# Patient Record
Sex: Male | Born: 1965 | Race: White | Hispanic: No | Smoking: Current every day smoker
Health system: Southern US, Community
[De-identification: ages and names within clinical notes are randomized; demographics above are authoritative.]

## PROBLEM LIST (undated history)

## (undated) DIAGNOSIS — K08109 Complete loss of teeth, unspecified cause, unspecified class: Secondary | ICD-10-CM

## (undated) DIAGNOSIS — D649 Anemia, unspecified: Secondary | ICD-10-CM

## (undated) DIAGNOSIS — R0600 Dyspnea, unspecified: Secondary | ICD-10-CM

## (undated) DIAGNOSIS — Z8614 Personal history of Methicillin resistant Staphylococcus aureus infection: Secondary | ICD-10-CM

## (undated) DIAGNOSIS — I10 Essential (primary) hypertension: Secondary | ICD-10-CM

## (undated) DIAGNOSIS — K279 Peptic ulcer, site unspecified, unspecified as acute or chronic, without hemorrhage or perforation: Secondary | ICD-10-CM

## (undated) DIAGNOSIS — I359 Nonrheumatic aortic valve disorder, unspecified: Secondary | ICD-10-CM

## (undated) DIAGNOSIS — G8929 Other chronic pain: Secondary | ICD-10-CM

## (undated) DIAGNOSIS — K219 Gastro-esophageal reflux disease without esophagitis: Secondary | ICD-10-CM

## (undated) DIAGNOSIS — Z87442 Personal history of urinary calculi: Secondary | ICD-10-CM

## (undated) DIAGNOSIS — J449 Chronic obstructive pulmonary disease, unspecified: Secondary | ICD-10-CM

## (undated) DIAGNOSIS — G4730 Sleep apnea, unspecified: Secondary | ICD-10-CM

## (undated) DIAGNOSIS — T7840XA Allergy, unspecified, initial encounter: Secondary | ICD-10-CM

## (undated) DIAGNOSIS — I219 Acute myocardial infarction, unspecified: Secondary | ICD-10-CM

## (undated) DIAGNOSIS — B3781 Candidal esophagitis: Secondary | ICD-10-CM

## (undated) DIAGNOSIS — E559 Vitamin D deficiency, unspecified: Secondary | ICD-10-CM

## (undated) DIAGNOSIS — I209 Angina pectoris, unspecified: Secondary | ICD-10-CM

## (undated) DIAGNOSIS — Z87820 Personal history of traumatic brain injury: Secondary | ICD-10-CM

## (undated) DIAGNOSIS — I351 Nonrheumatic aortic (valve) insufficiency: Secondary | ICD-10-CM

## (undated) DIAGNOSIS — Z8719 Personal history of other diseases of the digestive system: Secondary | ICD-10-CM

## (undated) DIAGNOSIS — M1990 Unspecified osteoarthritis, unspecified site: Secondary | ICD-10-CM

## (undated) DIAGNOSIS — Z972 Presence of dental prosthetic device (complete) (partial): Secondary | ICD-10-CM

## (undated) DIAGNOSIS — R05 Cough: Secondary | ICD-10-CM

## (undated) DIAGNOSIS — F329 Major depressive disorder, single episode, unspecified: Secondary | ICD-10-CM

## (undated) DIAGNOSIS — S72009A Fracture of unspecified part of neck of unspecified femur, initial encounter for closed fracture: Secondary | ICD-10-CM

## (undated) DIAGNOSIS — G608 Other hereditary and idiopathic neuropathies: Secondary | ICD-10-CM

## (undated) DIAGNOSIS — I5189 Other ill-defined heart diseases: Secondary | ICD-10-CM

## (undated) DIAGNOSIS — N200 Calculus of kidney: Secondary | ICD-10-CM

## (undated) DIAGNOSIS — J302 Other seasonal allergic rhinitis: Secondary | ICD-10-CM

## (undated) DIAGNOSIS — Q231 Congenital insufficiency of aortic valve: Secondary | ICD-10-CM

## (undated) DIAGNOSIS — J45909 Unspecified asthma, uncomplicated: Secondary | ICD-10-CM

## (undated) DIAGNOSIS — F32A Depression, unspecified: Secondary | ICD-10-CM

## (undated) DIAGNOSIS — I2510 Atherosclerotic heart disease of native coronary artery without angina pectoris: Secondary | ICD-10-CM

## (undated) DIAGNOSIS — T8484XA Pain due to internal orthopedic prosthetic devices, implants and grafts, initial encounter: Secondary | ICD-10-CM

## (undated) DIAGNOSIS — E876 Hypokalemia: Secondary | ICD-10-CM

## (undated) DIAGNOSIS — Z974 Presence of external hearing-aid: Secondary | ICD-10-CM

## (undated) DIAGNOSIS — Z87898 Personal history of other specified conditions: Secondary | ICD-10-CM

## (undated) DIAGNOSIS — Q2381 Bicuspid aortic valve: Secondary | ICD-10-CM

## (undated) DIAGNOSIS — R059 Cough, unspecified: Secondary | ICD-10-CM

## (undated) DIAGNOSIS — E785 Hyperlipidemia, unspecified: Secondary | ICD-10-CM

## (undated) HISTORY — DX: Gastro-esophageal reflux disease without esophagitis: K21.9

## (undated) HISTORY — DX: Other hereditary and idiopathic neuropathies: G60.8

## (undated) HISTORY — DX: Anemia, unspecified: D64.9

## (undated) HISTORY — DX: Allergy, unspecified, initial encounter: T78.40XA

## (undated) HISTORY — PX: FRACTURE SURGERY: SHX138

## (undated) HISTORY — DX: Essential (primary) hypertension: I10

## (undated) HISTORY — DX: Personal history of Methicillin resistant Staphylococcus aureus infection: Z86.14

## (undated) HISTORY — DX: Nonrheumatic aortic (valve) insufficiency: I35.1

## (undated) HISTORY — PX: KNEE SURGERY: SHX244

## (undated) HISTORY — PX: TONSILLECTOMY: SUR1361

## (undated) HISTORY — DX: Unspecified osteoarthritis, unspecified site: M19.90

## (undated) HISTORY — DX: Chronic obstructive pulmonary disease, unspecified: J44.9

## (undated) HISTORY — PX: OTHER SURGICAL HISTORY: SHX169

---

## 1988-06-19 HISTORY — PX: KNEE SURGERY: SHX244

## 1997-10-09 ENCOUNTER — Emergency Department (HOSPITAL_COMMUNITY): Admission: EM | Admit: 1997-10-09 | Discharge: 1997-10-09 | Payer: Self-pay | Admitting: Emergency Medicine

## 1998-06-19 HISTORY — PX: COLONOSCOPY: SHX174

## 2000-06-19 HISTORY — PX: HERNIA REPAIR: SHX51

## 2001-07-23 ENCOUNTER — Emergency Department (HOSPITAL_COMMUNITY): Admission: EM | Admit: 2001-07-23 | Discharge: 2001-07-23 | Payer: Self-pay | Admitting: Emergency Medicine

## 2001-07-24 ENCOUNTER — Encounter: Payer: Self-pay | Admitting: Emergency Medicine

## 2001-07-25 ENCOUNTER — Encounter: Payer: Self-pay | Admitting: Surgery

## 2001-07-25 ENCOUNTER — Emergency Department (HOSPITAL_COMMUNITY): Admission: EM | Admit: 2001-07-25 | Discharge: 2001-07-25 | Payer: Self-pay | Admitting: Emergency Medicine

## 2002-06-19 HISTORY — PX: JOINT REPLACEMENT: SHX530

## 2007-01-09 ENCOUNTER — Emergency Department: Payer: Self-pay | Admitting: Unknown Physician Specialty

## 2007-04-24 ENCOUNTER — Emergency Department: Payer: Self-pay | Admitting: Emergency Medicine

## 2007-08-25 ENCOUNTER — Emergency Department: Payer: Self-pay | Admitting: Emergency Medicine

## 2007-09-25 ENCOUNTER — Ambulatory Visit: Payer: Self-pay | Admitting: Internal Medicine

## 2008-04-25 ENCOUNTER — Emergency Department: Payer: Self-pay | Admitting: Emergency Medicine

## 2008-07-11 ENCOUNTER — Emergency Department: Payer: Self-pay | Admitting: Emergency Medicine

## 2009-08-24 ENCOUNTER — Inpatient Hospital Stay: Payer: Self-pay | Admitting: Unknown Physician Specialty

## 2011-01-26 ENCOUNTER — Ambulatory Visit: Payer: Self-pay | Admitting: Orthopedic Surgery

## 2011-01-31 ENCOUNTER — Ambulatory Visit: Payer: Self-pay | Admitting: Orthopedic Surgery

## 2011-01-31 HISTORY — PX: OTHER SURGICAL HISTORY: SHX169

## 2011-08-21 DIAGNOSIS — M1610 Unilateral primary osteoarthritis, unspecified hip: Secondary | ICD-10-CM | POA: Insufficient documentation

## 2011-08-21 DIAGNOSIS — S7292XA Unspecified fracture of left femur, initial encounter for closed fracture: Secondary | ICD-10-CM | POA: Insufficient documentation

## 2011-10-09 DIAGNOSIS — Z969 Presence of functional implant, unspecified: Secondary | ICD-10-CM | POA: Insufficient documentation

## 2011-10-09 DIAGNOSIS — M25559 Pain in unspecified hip: Secondary | ICD-10-CM | POA: Insufficient documentation

## 2012-06-28 DIAGNOSIS — M1610 Unilateral primary osteoarthritis, unspecified hip: Secondary | ICD-10-CM | POA: Insufficient documentation

## 2012-08-26 DIAGNOSIS — M7070 Other bursitis of hip, unspecified hip: Secondary | ICD-10-CM | POA: Insufficient documentation

## 2012-12-19 DIAGNOSIS — M2550 Pain in unspecified joint: Secondary | ICD-10-CM | POA: Insufficient documentation

## 2012-12-19 DIAGNOSIS — M25559 Pain in unspecified hip: Secondary | ICD-10-CM | POA: Insufficient documentation

## 2013-01-07 ENCOUNTER — Ambulatory Visit: Payer: Self-pay | Admitting: Family Medicine

## 2013-01-07 LAB — RAPID STREP-A WITH REFLX: Micro Text Report: NEGATIVE

## 2013-01-10 LAB — BETA STREP CULTURE(ARMC)

## 2013-02-12 DIAGNOSIS — K219 Gastro-esophageal reflux disease without esophagitis: Secondary | ICD-10-CM | POA: Insufficient documentation

## 2013-02-12 DIAGNOSIS — H9190 Unspecified hearing loss, unspecified ear: Secondary | ICD-10-CM | POA: Insufficient documentation

## 2013-12-25 ENCOUNTER — Emergency Department: Payer: Self-pay | Admitting: Emergency Medicine

## 2013-12-25 LAB — CBC
HCT: 42 % (ref 40.0–52.0)
HGB: 13.8 g/dL (ref 13.0–18.0)
MCH: 27.7 pg (ref 26.0–34.0)
MCHC: 32.8 g/dL (ref 32.0–36.0)
MCV: 85 fL (ref 80–100)
Platelet: 340 10*3/uL (ref 150–440)
RBC: 4.97 10*6/uL (ref 4.40–5.90)
RDW: 16 % — ABNORMAL HIGH (ref 11.5–14.5)
WBC: 9.7 10*3/uL (ref 3.8–10.6)

## 2013-12-25 LAB — BASIC METABOLIC PANEL
Anion Gap: 12 (ref 7–16)
BUN: 5 mg/dL — ABNORMAL LOW (ref 7–18)
Calcium, Total: 8.8 mg/dL (ref 8.5–10.1)
Chloride: 105 mmol/L (ref 98–107)
Co2: 21 mmol/L (ref 21–32)
Creatinine: 0.9 mg/dL (ref 0.60–1.30)
EGFR (African American): >60
EGFR (Non-African Amer.): >60
Glucose: 129 mg/dL — ABNORMAL HIGH (ref 65–99)
Osmolality: 275 (ref 275–301)
Potassium: 2.9 mmol/L — ABNORMAL LOW (ref 3.5–5.1)
Sodium: 138 mmol/L (ref 136–145)

## 2013-12-25 LAB — TROPONIN I: Troponin-I: <0.02 ng/mL

## 2014-01-05 DIAGNOSIS — R0602 Shortness of breath: Secondary | ICD-10-CM | POA: Insufficient documentation

## 2014-01-05 DIAGNOSIS — Z77090 Contact with and (suspected) exposure to asbestos: Secondary | ICD-10-CM | POA: Insufficient documentation

## 2014-04-16 DIAGNOSIS — M25562 Pain in left knee: Secondary | ICD-10-CM | POA: Insufficient documentation

## 2014-04-21 ENCOUNTER — Ambulatory Visit: Payer: Self-pay | Admitting: Unknown Physician Specialty

## 2014-05-28 DIAGNOSIS — G2581 Restless legs syndrome: Secondary | ICD-10-CM

## 2014-05-28 DIAGNOSIS — Z720 Tobacco use: Secondary | ICD-10-CM | POA: Insufficient documentation

## 2014-05-28 DIAGNOSIS — I10 Essential (primary) hypertension: Secondary | ICD-10-CM | POA: Insufficient documentation

## 2014-05-28 HISTORY — DX: Restless legs syndrome: G25.81

## 2014-07-21 DIAGNOSIS — M79604 Pain in right leg: Secondary | ICD-10-CM | POA: Insufficient documentation

## 2014-07-21 DIAGNOSIS — M79605 Pain in left leg: Secondary | ICD-10-CM

## 2014-07-24 ENCOUNTER — Ambulatory Visit: Payer: Self-pay | Admitting: Neurology

## 2014-08-11 DIAGNOSIS — M542 Cervicalgia: Secondary | ICD-10-CM | POA: Insufficient documentation

## 2014-08-18 ENCOUNTER — Encounter: Payer: Self-pay | Admitting: *Deleted

## 2014-08-26 ENCOUNTER — Encounter: Payer: Self-pay | Admitting: *Deleted

## 2014-08-26 ENCOUNTER — Ambulatory Visit (INDEPENDENT_AMBULATORY_CARE_PROVIDER_SITE_OTHER): Payer: Medicare Other | Admitting: General Surgery

## 2014-08-26 ENCOUNTER — Encounter: Payer: Self-pay | Admitting: General Surgery

## 2014-08-26 VITALS — BP 180/100 | HR 82 | Resp 14 | Ht 67.0 in | Wt 145.0 lb

## 2014-08-26 DIAGNOSIS — R1030 Lower abdominal pain, unspecified: Secondary | ICD-10-CM

## 2014-08-26 DIAGNOSIS — R1031 Right lower quadrant pain: Secondary | ICD-10-CM

## 2014-08-26 NOTE — Patient Instructions (Signed)
Patient to have a cat scan .

## 2014-08-26 NOTE — Progress Notes (Signed)
Patient ID: Kevin Alexander, male   DOB: [birth year], 49 y.o.   MRN: [id number]  Patient has been scheduled for a CT abdomen/pelvis with contrast at [facility] for 08-28-14 at 4 pm (arrive 3:45 pm). Prep: no solids 4 hours prior but patient may have clear liquids up until exam time, pick up prep kit, and take medication list. Patient verbalizes understanding.  This patient will return for follow up appointment after CT scan has been completed.

## 2014-08-26 NOTE — Progress Notes (Signed)
Patient ID: Kevin Alexander, male   DOB: Jul 13, 1965, 49 y.o.   MRN: [id number]  Chief Complaint  Patient presents with  . Other    evaluation for inguinal hernia    HPI Kevin Alexander is a 49 y.o. male here today for a evaluation of a right inguinal hernia. Patient noticed this about one month ago. He states the area is swollen and the area usually stay out with a burning sensation.Patient had bilateral inguinal repair in 2002 via laparoscopy with mesh. Patient states he has had blood in his stool  and started taking iron pill and fiber  and has noticed  Blood on paper. The stools  are dark black-possibly from iron intake.  HPI  Past Medical History  Diagnosis Date  . GERD (gastroesophageal reflux disease)   . Hx MRSA infection   . Anemia   . Arthritis   . Hypertension     Past Surgical History  Procedure Laterality Date  . Knee surgery Right 1990  . Joint replacement  2004  . Hernia repair Bilateral 2002  . Colonoscopy  2000    History reviewed. No pertinent family history.  Social History History  Substance Use Topics  . Smoking status: Current Every Day Smoker -- 1.00 packs/day for 30 years  . Smokeless tobacco: Never Used  . Alcohol Use: Yes    No Known Allergies  Current Outpatient Prescriptions  Medication Sig Dispense Refill  . cetirizine (ZYRTEC) 10 MG tablet Take 10 mg by mouth daily.  11  . diclofenac (VOLTAREN) 50 MG EC tablet Take 50 mg by mouth.   1  . fexofenadine (ALLEGRA) 180 MG tablet Take 180 mg by mouth daily.   3  . gabapentin (NEURONTIN) 300 MG capsule Take 300 mg by mouth 2 (two) times daily.   3  . Inulin (FIBER CHOICE PO) Take by mouth daily.    . IRON CR PO Take by mouth daily.    Marland Kitchen lisinopril-hydrochlorothiazide (PRINZIDE,ZESTORETIC) 20-12.5 MG per tablet Take 1 tablet by mouth daily.   0  . omeprazole (PRILOSEC) 40 MG capsule Take 40 mg by mouth daily.  3  . sertraline (ZOLOFT) 50 MG tablet Take 75 mg by mouth daily.  0  .  traMADol-acetaminophen (ULTRACET) 37.5-325 MG per tablet 1 tablet every 6 (six) hours as needed.   0   No current facility-administered medications for this visit.    Review of Systems Review of Systems  Constitutional: Negative.   Respiratory: Negative.   Cardiovascular: Negative.   Gastrointestinal: Positive for constipation and blood in stool. Negative for nausea, vomiting, abdominal pain, diarrhea, abdominal distention, anal bleeding and rectal pain.    Blood pressure 180/100, pulse 82, resp. rate 14, height 5\' 7"  (1.702 m), weight 145 lb (65.772 kg).  Physical Exam Physical Exam  Constitutional: He is oriented to person, place, and time. He appears well-developed and well-nourished.  Eyes: Conjunctivae are normal. No scleral icterus.  Neck: Neck supple.  Cardiovascular: Normal rate, regular rhythm and normal heart sounds.   Pulmonary/Chest: Effort normal and breath sounds normal.  Abdominal: Soft. Normal appearance and bowel sounds are normal. There is no hepatomegaly. There is no tenderness. No hernia ( tenderness in the right groin).  Lymphadenopathy:    He has no cervical adenopathy.  Neurological: He is alert and oriented to person, place, and time.  Skin: Skin is warm and dry.   Careful exam of the inguinal area with pt supine and standing and straining showed no hernia  Data Reviewed Note reviewed   Assessment    Right groin pain. No hernia noted but by pt history he had noted a swelling there before.     Plan   Discussed findings with pt. CT may help. He is agreeable. Patient to have a cat scan of the abdomen/ pelvis.   Vung Kush G 08/26/2014, 11:11 AM

## 2014-09-03 ENCOUNTER — Ambulatory Visit: Payer: Medicare Other | Admitting: General Surgery

## 2014-09-07 ENCOUNTER — Ambulatory Visit: Payer: Self-pay | Admitting: Surgery

## 2014-09-10 ENCOUNTER — Ambulatory Visit: Payer: Self-pay | Admitting: Surgery

## 2014-10-05 ENCOUNTER — Ambulatory Visit
Admit: 2014-10-05 | Disposition: A | Discharge status: Home / Self Care | Payer: Self-pay | Attending: Gastroenterology | Admitting: Gastroenterology

## 2015-01-18 DIAGNOSIS — I219 Acute myocardial infarction, unspecified: Secondary | ICD-10-CM

## 2015-01-18 HISTORY — DX: Acute myocardial infarction, unspecified: I21.9

## 2015-04-06 ENCOUNTER — Encounter: Payer: Self-pay | Admitting: *Deleted

## 2015-04-07 ENCOUNTER — Encounter: Payer: Self-pay | Admitting: Anesthesiology

## 2015-04-07 NOTE — Anesthesia Preprocedure Evaluation (Addendum)
Anesthesia Evaluation  Patient identified by MRN, date of birth, ID band  Reviewed: Allergy & Precautions, H&P , NPO status , Patient's Chart, lab work & pertinent test results  Airway Mallampati: II  TM Distance: >3 FB Neck ROM: full    Dental no notable dental hx. (+) Lower Dentures, Upper Dentures   Pulmonary COPD, Current Smoker,    Pulmonary exam normal        Cardiovascular hypertension,  Rhythm:regular Rate:Normal     Neuro/Psych Seizures -,     GI/Hepatic GERD  ,  Endo/Other    Renal/GU      Musculoskeletal   Abdominal   Peds  Hematology   Anesthesia Other Findings   Reproductive/Obstetrics                           Anesthesia Physical Anesthesia Plan  ASA: II  Anesthesia Plan: General ETT   Post-op Pain Management:    Induction:   Airway Management Planned:   Additional Equipment:   Intra-op Plan:   Post-operative Plan:   Informed Consent: I have reviewed the patients History and Physical, chart, labs and discussed the procedure including the risks, benefits and alternatives for the proposed anesthesia with the patient or authorized representative who has indicated his/her understanding and acceptance.     Plan Discussed with: CRNA  Anesthesia Plan Comments:         Anesthesia Quick Evaluation

## 2015-04-08 ENCOUNTER — Encounter: Payer: Self-pay | Admitting: *Deleted

## 2015-04-12 NOTE — Discharge Instructions (Signed)
[facility] °[facility] °ENDOSCOPIC SINUS SURGERY °Why [facility] ° °What is Functional Endoscopic Sinus Surgery? ° The Surgery involves making the natural openings of the sinuses larger by removing the bony partitions that separate the sinuses from the nasal cavity.  The natural sinus lining is preserved as much as possible to allow the sinuses to resume normal function after the surgery.  In some patients nasal polyps (excessively swollen lining of the sinuses) may be removed to relieve obstruction of the sinus openings.  The surgery is performed through the nose using lighted scopes, which eliminates the need for incisions on the face.  A septoplasty is a different procedure which is sometimes performed with sinus surgery.  It involves straightening the boy partition that separates the two sides of your nose.  A crooked or deviated septum may need repair if is obstructing the sinuses or nasal airflow.  Turbinate reduction is also often performed during sinus surgery.  The turbinates are bony proturberances from the side walls of the nose which swell and can obstruct the nose in patients with sinus and allergy problems.  Their size can be surgically reduced to help relieve nasal obstruction. ° °What Can Sinus Surgery Do For Me? ° Sinus surgery can reduce the frequency of sinus infections requiring antibiotic treatment.  This can provide improvement in nasal congestion, post-nasal drainage, facial pressure and nasal obstruction.  Surgery will NOT prevent you from ever having an infection again, so it usually only for patients who get infections 4 or more times yearly requiring antibiotics, or for infections that do not clear with antibiotics.  It will not cure nasal allergies, so patients with allergies may still require medication to treat their allergies after surgery. Surgery may improve headaches related to sinusitis, however, some people will continue to  require medication to control sinus headaches related to allergies.  Surgery will do nothing for other forms of headache (migraine, tension or cluster). ° °What Are the Risks of Endoscopic Sinus Surgery? ° Current techniques allow surgery to be performed safely with little risk, however, there are rare complications that patients should be aware of.  Because the sinuses are located around the eyes, there is risk of eye injury, including blindness, though again, this would be quite rare. This is usually a result of bleeding behind the eye during surgery, which puts the vision oat risk, though there are treatments to protect the vision and prevent permanent disrupted by surgery causing a leak of the spinal fluid that surrounds the brain.  More serious complications would include bleeding inside the brain cavity or damage to the brain.  Again, all of these complications are uncommon, and spinal fluid leaks can be safely managed surgically if they occur.  The most common complication of sinus surgery is bleeding from the nose, which may require packing or cauterization of the nose.  Continued sinus have polyps may experience recurrence of the polyps requiring revision surgery.  Alterations of sense of smell or injury to the tear ducts are also rare complications.  ° °What is the Surgery Like, and what is the Recovery? ° The Surgery usually takes a couple of hours to perform, and is usually performed under a general anesthetic (completely asleep).  Patients are usually discharged home after a couple of hours.  Sometimes during surgery it is necessary to pack the nose to control bleeding, and the packing is left in place for 24 - 48 hours, and removed by your surgeon.    If a septoplasty was performed during the procedure, there is often a splint placed which must be removed after 5-7 days.   °Discomfort: Pain is usually mild to moderate, and can be controlled by prescription pain medication or acetaminophen (Tylenol).   Aspirin, Ibuprofen (Advil, Motrin), or Naprosyn (Aleve) should be avoided, as they can cause increased bleeding.  Most patients feel sinus pressure like they have a bad head cold for several days.  Sleeping with your head elevated can help reduce swelling and facial pressure, as can ice packs over the face.  A humidifier may be helpful to keep the mucous and blood from drying in the nose.  ° °Diet: There are no specific diet restrictions, however, you should generally start with clear liquids and a light diet of bland foods because the anesthetic can cause some nausea.  Advance your diet depending on how your stomach feels.  Taking your pain medication with food will often help reduce stomach upset which pain medications can cause. ° °Nasal Saline Irrigation: It is important to remove blood clots and dried mucous from the nose as it is healing.  This is done by having you irrigate the nose at least 3 - 4 times daily with a salt water solution.  We recommend using NeilMed Sinus Rinse (available at the drug store).  Fill the squeeze bottle with the solution, bend over a sink, and insert the tip of the squeeze bottle into the nose ½ of an inch.  Point the tip of the squeeze bottle towards the inside corner of the eye on the same side your irrigating.  Squeeze the bottle and gently irrigate the nose.  If you bend forward as you do this, most of the fluid will flow back out of the nose, instead of down your throat.   The solution should be warm, near body temperature, when you irrigate.   Each time you irrigate, you should use a full squeeze bottle.  ° °Note that if you are instructed to use Nasal Steroid Sprays at any time after your surgery, irrigate with saline BEFORE using the steroid spray, so you do not wash it all out of the nose. °Another product, Nasal Saline Gel (such as AYR Nasal Saline Gel) can be applied in each nostril 3 - 4 times daily to moisture the nose and reduce scabbing or crusting. ° °Bleeding:   Bloody drainage from the nose can be expected for several days, and patients are instructed to irrigate their nose frequently with salt water to help remove mucous and blood clots.  The drainage may be dark red or brown, though some fresh blood may be seen intermittently, especially after irrigation.  Do not blow you nose, as bleeding may occur. If you must sneeze, keep your mouth open to allow air to escape through your mouth. ° °If heavy bleeding occurs: Irrigate the nose with saline to rinse out clots, then spray the nose 3 - 4 times with Afrin Nasal Decongestant Spray.  The spray will constrict the blood vessels to slow bleeding.  Pinch the lower half of your nose shut to apply pressure, and lay down with your head elevated.  Ice packs over the nose may help as well. If bleeding persists despite these measures, you should notify your doctor.  Do not use the Afrin routinely to control nasal congestion after surgery, as it can result in worsening congestion and may affect healing.  ° ° ° °Activity: Return to work varies among patients. Most patients will be   out of work at least 5 - 7 days to recover.  Patient may return to work after they are off of narcotic pain medication, and feeling well enough to perform the functions of their job.  Patients must avoid heavy lifting (over 10 pounds) or strenuous physical for 2 weeks after surgery, so your employer may need to assign you to light duty, or keep you out of work longer if light duty is not possible.  NOTE: you should not drive, operate dangerous machinery, do any mentally demanding tasks or make any important legal or financial decisions while on narcotic pain medication and recovering from the general anesthetic.  °  °Call Your Doctor Immediately if You Have Any of the Following: °1. Bleeding that you cannot control with the above measures °2. Loss of vision, double vision, bulging of the eye or black eyes. °3. Fever over 101 degrees °4. Neck stiffness with  severe headache, fever, nausea and change in mental state. °You are always encourage to call anytime with concerns, however, please call with requests for pain medication refills during office hours. ° °Office Endoscopy: During follow-up visits your doctor will remove any packing or splints that may have been placed and evaluate and clean your sinuses endoscopically.  Topical anesthetic will be used to make this as comfortable as possible, though you may want to take your pain medication prior to the visit.  How often this will need to be done varies from patient to patient.  After complete recovery from the surgery, you may need follow-up endoscopy from time to time, particularly if there is concern of recurrent infection or nasal polyps. ° °General Anesthesia, Adult, Care After °Refer to this sheet in the next few weeks. These instructions provide you with information on caring for yourself after your procedure. Your health care provider may also give you more specific instructions. Your treatment has been planned according to current medical practices, but problems sometimes occur. Call your health care provider if you have any problems or questions after your procedure. °WHAT TO EXPECT AFTER THE PROCEDURE °After the procedure, it is typical to experience: °· Sleepiness. °· Nausea and vomiting. °HOME CARE INSTRUCTIONS °· For the first 24 hours after general anesthesia: °¨ Have a responsible person with you. °¨ Do not drive a car. If you are alone, do not take public transportation. °¨ Do not drink alcohol. °¨ Do not take medicine that has not been prescribed by your health care provider. °¨ Do not sign important papers or make important decisions. °¨ You may resume a normal diet and activities as directed by your health care provider. °· Change bandages (dressings) as directed. °· If you have questions or problems that seem related to general anesthesia, call the hospital and ask for the anesthetist or  anesthesiologist on call. °SEEK MEDICAL CARE IF: °· You have nausea and vomiting that continue the day after anesthesia. °· You develop a rash. °SEEK IMMEDIATE MEDICAL CARE IF:  °· You have difficulty breathing. °· You have chest pain. °· You have any allergic problems. °  °This information is not intended to replace advice given to you by your health care provider. Make sure you discuss any questions you have with your health care provider. °  °Document Released: 09/11/2000 Document Revised: 06/26/2014 Document Reviewed: 10/04/2011 °Elsevier Interactive Patient Education ©2016 [employer] ° °

## 2015-04-13 ENCOUNTER — Ambulatory Visit: Payer: Medicare Other | Admitting: Anesthesiology

## 2015-04-13 ENCOUNTER — Encounter
Admission: RE | Disposition: A | Discharge status: Home / Self Care | Payer: Self-pay | Source: Ambulatory Visit | Attending: Otolaryngology

## 2015-04-13 ENCOUNTER — Ambulatory Visit
Admission: RE | Admit: 2015-04-13 | Discharge: 2015-04-13 | Disposition: A | Discharge status: Home / Self Care | Payer: Medicare Other | Source: Ambulatory Visit | Attending: Otolaryngology | Admitting: Otolaryngology

## 2015-04-13 DIAGNOSIS — J343 Hypertrophy of nasal turbinates: Secondary | ICD-10-CM | POA: Diagnosis not present

## 2015-04-13 DIAGNOSIS — Z7982 Long term (current) use of aspirin: Secondary | ICD-10-CM | POA: Insufficient documentation

## 2015-04-13 DIAGNOSIS — K219 Gastro-esophageal reflux disease without esophagitis: Secondary | ICD-10-CM | POA: Diagnosis not present

## 2015-04-13 DIAGNOSIS — I10 Essential (primary) hypertension: Secondary | ICD-10-CM | POA: Diagnosis not present

## 2015-04-13 DIAGNOSIS — J449 Chronic obstructive pulmonary disease, unspecified: Secondary | ICD-10-CM | POA: Insufficient documentation

## 2015-04-13 DIAGNOSIS — F17210 Nicotine dependence, cigarettes, uncomplicated: Secondary | ICD-10-CM | POA: Diagnosis not present

## 2015-04-13 DIAGNOSIS — R569 Unspecified convulsions: Secondary | ICD-10-CM | POA: Insufficient documentation

## 2015-04-13 DIAGNOSIS — J342 Deviated nasal septum: Secondary | ICD-10-CM | POA: Insufficient documentation

## 2015-04-13 HISTORY — PX: TURBINATE RESECTION: SHX6158

## 2015-04-13 HISTORY — DX: Cough: R05

## 2015-04-13 HISTORY — PX: SEPTOPLASTY: SHX2393

## 2015-04-13 HISTORY — DX: Acute myocardial infarction, unspecified: I21.9

## 2015-04-13 HISTORY — DX: Nonrheumatic aortic valve disorder, unspecified: I35.9

## 2015-04-13 HISTORY — DX: Cough, unspecified: R05.9

## 2015-04-13 HISTORY — DX: Presence of dental prosthetic device (complete) (partial): Z97.2

## 2015-04-13 HISTORY — DX: Presence of external hearing-aid: Z97.4

## 2015-04-13 SURGERY — SEPTOPLASTY, NOSE
Anesthesia: General | Wound class: Clean Contaminated

## 2015-04-13 MED ORDER — MIDAZOLAM HCL 5 MG/5ML IJ SOLN
INTRAMUSCULAR | Status: DC | PRN
  Administered 2015-04-13: 2 mg via INTRAVENOUS

## 2015-04-13 MED ORDER — SUCCINYLCHOLINE CHLORIDE 20 MG/ML IJ SOLN
INTRAMUSCULAR | Status: DC | PRN
  Administered 2015-04-13: 100 mg via INTRAVENOUS

## 2015-04-13 MED ORDER — ONDANSETRON HCL 4 MG/2ML IJ SOLN
INTRAMUSCULAR | Status: DC | PRN
  Administered 2015-04-13: 4 mg via INTRAVENOUS

## 2015-04-13 MED ORDER — HYDROCODONE-ACETAMINOPHEN 5-325 MG PO TABS: 1.0000 | ORAL_TABLET | Freq: Four times a day (QID) | ORAL | Status: DC | PRN

## 2015-04-13 MED ORDER — OXYMETAZOLINE HCL 0.05 % NA SOLN
NASAL | Status: DC | PRN
  Administered 2015-04-13: 1

## 2015-04-13 MED ORDER — LACTATED RINGERS IV SOLN
INTRAVENOUS | Status: DC
  Administered 2015-04-13: 09:00:00 via INTRAVENOUS

## 2015-04-13 MED ORDER — DEXAMETHASONE SODIUM PHOSPHATE 4 MG/ML IJ SOLN
INTRAMUSCULAR | Status: DC | PRN
  Administered 2015-04-13: 8 mg via INTRAVENOUS

## 2015-04-13 MED ORDER — FENTANYL CITRATE (PF) 100 MCG/2ML IJ SOLN
INTRAMUSCULAR | Status: DC | PRN
  Administered 2015-04-13: 100 ug via INTRAVENOUS

## 2015-04-13 MED ORDER — PROPOFOL 10 MG/ML IV BOLUS
INTRAVENOUS | Status: DC | PRN
  Administered 2015-04-13: 200 mg via INTRAVENOUS

## 2015-04-13 MED ORDER — FENTANYL CITRATE (PF) 100 MCG/2ML IJ SOLN: 25.0000 ug | INTRAMUSCULAR | Status: DC | PRN

## 2015-04-13 MED ORDER — LIDOCAINE-EPINEPHRINE 1 %-1:100000 IJ SOLN
INTRAMUSCULAR | Status: DC | PRN
  Administered 2015-04-13: 12 mL

## 2015-04-13 MED ORDER — EPHEDRINE SULFATE 50 MG/ML IJ SOLN
INTRAMUSCULAR | Status: DC | PRN
  Administered 2015-04-13 (×4): 5 mg via INTRAVENOUS

## 2015-04-13 MED ORDER — LIDOCAINE HCL (CARDIAC) 20 MG/ML IV SOLN
INTRAVENOUS | Status: DC | PRN
  Administered 2015-04-13: 40 mg via INTRAVENOUS

## 2015-04-13 MED ORDER — CEFPROZIL 500 MG PO TABS: 500.0000 mg | ORAL_TABLET | Freq: Two times a day (BID) | ORAL | Status: DC

## 2015-04-13 MED ORDER — ONDANSETRON HCL 4 MG/2ML IJ SOLN
4.0000 mg | Freq: Once | INTRAMUSCULAR | Status: AC | PRN
  Administered 2015-04-13: 4 mg via INTRAVENOUS

## 2015-04-13 MED ORDER — ACETAMINOPHEN 10 MG/ML IV SOLN
1000.0000 mg | Freq: Once | INTRAVENOUS | Status: AC
  Administered 2015-04-13: 1000 mg via INTRAVENOUS

## 2015-04-13 MED ORDER — ALBUTEROL SULFATE HFA 108 (90 BASE) MCG/ACT IN AERS
INHALATION_SPRAY | RESPIRATORY_TRACT | Status: DC | PRN
  Administered 2015-04-13: 2 via RESPIRATORY_TRACT

## 2015-04-13 MED ORDER — GLYCOPYRROLATE 0.2 MG/ML IJ SOLN
INTRAMUSCULAR | Status: DC | PRN
  Administered 2015-04-13: 0.2 mg via INTRAVENOUS

## 2015-04-13 SURGICAL SUPPLY — 20 items
CANISTER SUCT 1200ML W/VALVE (MISCELLANEOUS) ×4 IMPLANT
COAG SUCT 10F 3.5MM HAND CTRL (MISCELLANEOUS) ×4 IMPLANT
DRAPE HEAD BAR (DRAPES) ×4 IMPLANT
GLOVE EXAM LX STRL 7.5 (GLOVE) ×10 IMPLANT
NDL HYPO 25GX1X1/2 BEV (NEEDLE) ×2 IMPLANT
NEEDLE HYPO 25GX1X1/2 BEV (NEEDLE) ×4 IMPLANT
NS IRRIG 500ML POUR BTL (IV SOLUTION) ×4 IMPLANT
PACK DRAPE NASAL/ENT (PACKS) ×4 IMPLANT
PAD GROUND ADULT SPLIT (MISCELLANEOUS) ×4 IMPLANT
PATTIES SURGICAL .5 X3 (DISPOSABLE) ×4 IMPLANT
SPLINT NASAL SEPTAL PRE-CUT (MISCELLANEOUS) ×4 IMPLANT
SUT CHROMIC 4 0 RB 1X27 (SUTURE) ×4 IMPLANT
SUT ETHILON 3-0 FS-10 30 BLK (SUTURE)
SUT ETHILON 4-0 (SUTURE) ×4
SUT ETHILON 4-0 FS2 18XMFL BLK (SUTURE) ×2
SUT PLAIN GUT 4-0 (SUTURE) ×4 IMPLANT
SUTURE EHLN 3-0 FS-10 30 BLK (SUTURE) ×2 IMPLANT
SUTURE ETHLN 4-0 FS2 18XMF BLK (SUTURE) IMPLANT
SYRINGE 10CC LL (SYRINGE) ×4 IMPLANT
TOWEL OR 17X26 4PK STRL BLUE (TOWEL DISPOSABLE) ×4 IMPLANT

## 2015-04-13 NOTE — Anesthesia Procedure Notes (Signed)
Procedure Name: Intubation Date/Time: 04/13/2015 10:36 AM Performed by: Londell Moh Pre-anesthesia Checklist: Patient identified, Emergency Drugs available, Suction available, Patient being monitored and Timeout performed Patient Re-evaluated:Patient Re-evaluated prior to inductionOxygen Delivery Method: Circle system utilized Preoxygenation: Pre-oxygenation with 100% oxygen Intubation Type: IV induction Ventilation: Mask ventilation without difficulty and Oral airway inserted - appropriate to patient size Laryngoscope Size: Mac and 3 Grade View: Grade I Tube type: Oral Rae Tube size: 7.0 mm Number of attempts: 1 Airway Equipment and Method: LTA kit utilized Placement Confirmation: ETT inserted through vocal cords under direct vision,  positive ETCO2 and breath sounds checked- equal and bilateral Tube secured with: Tape Dental Injury: Teeth and Oropharynx as per pre-operative assessment

## 2015-04-13 NOTE — Anesthesia Postprocedure Evaluation (Signed)
  Anesthesia Post-op Note  Patient: Kevin Alexander  Procedure(s) Performed: Procedure(s): SEPTOPLASTY (N/A) SUBMUCOUS TURBINATE RESECTION  (Bilateral)  Anesthesia type:General ETT  Patient location: PACU  Post pain: Pain level controlled  Post assessment: Post-op Vital signs reviewed, Patient's Cardiovascular Status Stable, Respiratory Function Stable, Patent Airway and No signs of Nausea or vomiting  Post vital signs: Reviewed and stable  Last Vitals:  Filed Vitals:   04/13/15 1230  BP: 142/83  Pulse: 86  Temp:   Resp: 16    Level of consciousness: awake, alert  and patient cooperative  Complications: No apparent anesthesia complications

## 2015-04-13 NOTE — Op Note (Signed)
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 Hypertrophy of the inferior turbinates  Procedure: The patient was taken to the Operating Room and placed in the supine position.  After induction of general endotracheal anesthesia, the table was turned 90 degrees. A time-out was issued to confirm the site and procedure. The nasal septum and inferior turbinates where then injected with 1% lidocaine with epiniephrine, 1: 100,000. The nose was decongested with Afrin soaked pledgets. The patient was then prepped and draped in the usual sterile fashion. Beginning on the left hand side a hemitransfixion incision was then created on the leading edge of the septum on the left.  A subperichondrial plane was elevated posteriorly on the left and taken back to the perpendicular plate of the ethmoid where subperiosteal plane was elevated posteriorly on the left. A large septal spur was identified on the right hand side.  An inferior rim of redundant septal cartilage was removed from where it deviated over the maxillary crest. The perpendicular plate of the ethmoid was separated from the quadrangular cartilage, and a subperiosteal plane elevated on the right of the bony septum. The large septal spur was removed, dividing the septal bone superiorly with Knight scissors, and inferiorly from the maxillary crest with a chisel.  Part of the mid portion of the deviated septal cartilage was resected to further straighten the septum.  The septum was then replaced in the midline. Reinspection through each nostril showed excellent reduction of the septal deformity. A  left posterior inferior fenestration was then created to allow hematoma drainage.  The septal incision was closed with 4-0 chromic gut suture. A 4-0 plain gut suture was used to reapproximate the septal flaps to the underlying cartilage, utilizing a running, quilting type stitch.   With the septoplasty completed, beginning on the left-hand side, a 15 blade was used to incise along the inferior edge of the inferior turbinate. A superior laterally based flap of the medial turbinate mucosa was then elevated. A portion of the underlying conchal bone and lateral mucosa was excised using Knight scissors. The flap was then laid back over the turbinate stump and the bleeding edge of the turbinate cauterized using suction cautery. In a similar fashion the submucous resection was performed on the right.  With the submucous resection completed bilaterally and no active bleeding, nasal septal splints were placed within each nostril and affixed to the septum using a 3-0 nylon suture.     The patient was then returned to the anesthesiologist for awakening, and was taken to the Recovery Room in stable condition.  Disposition:   PACU to home  Plan: Soft, bland diet and push fluids. Take pain medications and antibiotics as prescribed. No strenuous activity for 2 weeks. Follow-up in 3 weeks.  Riley Nearing 04/13/2015 11:50 AM

## 2015-04-13 NOTE — H&P (Signed)
History and physical reviewed and will be scanned in later. No change in medical status reported by the patient or family, appears stable for surgery. All questions regarding the procedure answered, and patient (or family if a child) expressed understanding of the procedure.  Kevin Alexander S @TODAY@ 

## 2015-04-13 NOTE — Transfer of Care (Signed)
Immediate Anesthesia Transfer of Care Note  Patient: Kevin Alexander  Procedure(s) Performed: Procedure(s): SEPTOPLASTY (N/A) SUBMUCOUS TURBINATE RESECTION  (Bilateral)  Patient Location: PACU  Anesthesia Type: General ETT  Level of Consciousness: awake, alert  and patient cooperative  Airway and Oxygen Therapy: Patient Spontanous Breathing and Patient connected to supplemental oxygen  Post-op Assessment: Post-op Vital signs reviewed, Patient's Cardiovascular Status Stable, Respiratory Function Stable, Patent Airway and No signs of Nausea or vomiting  Post-op Vital Signs: Reviewed and stable  Complications: No apparent anesthesia complications

## 2015-04-14 ENCOUNTER — Encounter: Payer: Self-pay | Admitting: Otolaryngology

## 2015-07-13 ENCOUNTER — Emergency Department: Payer: Medicare Other

## 2015-07-13 ENCOUNTER — Emergency Department
Admission: EM | Admit: 2015-07-13 | Discharge: 2015-07-13 | Disposition: A | Discharge status: Home / Self Care | Payer: Medicare Other | Attending: Emergency Medicine | Admitting: Emergency Medicine

## 2015-07-13 ENCOUNTER — Encounter: Payer: Self-pay | Admitting: *Deleted

## 2015-07-13 DIAGNOSIS — Z792 Long term (current) use of antibiotics: Secondary | ICD-10-CM | POA: Diagnosis not present

## 2015-07-13 DIAGNOSIS — Z79899 Other long term (current) drug therapy: Secondary | ICD-10-CM | POA: Diagnosis not present

## 2015-07-13 DIAGNOSIS — A084 Viral intestinal infection, unspecified: Secondary | ICD-10-CM | POA: Diagnosis not present

## 2015-07-13 DIAGNOSIS — I10 Essential (primary) hypertension: Secondary | ICD-10-CM | POA: Insufficient documentation

## 2015-07-13 DIAGNOSIS — E860 Dehydration: Secondary | ICD-10-CM | POA: Insufficient documentation

## 2015-07-13 DIAGNOSIS — F17210 Nicotine dependence, cigarettes, uncomplicated: Secondary | ICD-10-CM | POA: Insufficient documentation

## 2015-07-13 DIAGNOSIS — R112 Nausea with vomiting, unspecified: Secondary | ICD-10-CM | POA: Diagnosis present

## 2015-07-13 LAB — URINALYSIS COMPLETE WITH MICROSCOPIC (ARMC ONLY)
Bacteria, UA: NONE SEEN
Bilirubin Urine: NEGATIVE
Glucose, UA: NEGATIVE mg/dL
Hgb urine dipstick: NEGATIVE
Ketones, ur: NEGATIVE mg/dL
Leukocytes, UA: NEGATIVE
Nitrite: NEGATIVE
Protein, ur: 100 mg/dL — AB
Specific Gravity, Urine: 1.019 (ref 1.005–1.030)
pH: 5 (ref 5.0–8.0)

## 2015-07-13 LAB — CBC
HCT: 51.2 % (ref 40.0–52.0)
Hemoglobin: 17.1 g/dL (ref 13.0–18.0)
MCH: 29 pg (ref 26.0–34.0)
MCHC: 33.4 g/dL (ref 32.0–36.0)
MCV: 86.9 fL (ref 80.0–100.0)
Platelets: 254 10*3/uL (ref 150–440)
RBC: 5.89 MIL/uL (ref 4.40–5.90)
RDW: 14.8 % — ABNORMAL HIGH (ref 11.5–14.5)
WBC: 13.4 10*3/uL — ABNORMAL HIGH (ref 3.8–10.6)

## 2015-07-13 LAB — COMPREHENSIVE METABOLIC PANEL
ALT: 19 U/L (ref 17–63)
AST: 23 U/L (ref 15–41)
Albumin: 4.8 g/dL (ref 3.5–5.0)
Alkaline Phosphatase: 133 U/L — ABNORMAL HIGH (ref 38–126)
Anion gap: 12 (ref 5–15)
BUN: 17 mg/dL (ref 6–20)
CO2: 20 mmol/L — ABNORMAL LOW (ref 22–32)
Calcium: 9.8 mg/dL (ref 8.9–10.3)
Chloride: 103 mmol/L (ref 101–111)
Creatinine, Ser: 1.66 mg/dL — ABNORMAL HIGH (ref 0.61–1.24)
GFR calc Af Amer: 54 mL/min — ABNORMAL LOW (ref >60)
GFR calc non Af Amer: 47 mL/min — ABNORMAL LOW (ref >60)
Glucose, Bld: 159 mg/dL — ABNORMAL HIGH (ref 65–99)
Potassium: 3.5 mmol/L (ref 3.5–5.1)
Sodium: 135 mmol/L (ref 135–145)
Total Bilirubin: 0.7 mg/dL (ref 0.3–1.2)
Total Protein: 8.5 g/dL — ABNORMAL HIGH (ref 6.5–8.1)

## 2015-07-13 LAB — LIPASE, BLOOD: Lipase: 35 U/L (ref 11–51)

## 2015-07-13 LAB — TROPONIN I: Troponin I: <0.03 ng/mL (ref <0.031)

## 2015-07-13 MED ORDER — ONDANSETRON HCL 4 MG/2ML IJ SOLN
4.0000 mg | Freq: Once | INTRAMUSCULAR | Status: AC
  Administered 2015-07-13: 4 mg via INTRAVENOUS
  Filled 2015-07-13: qty 2

## 2015-07-13 MED ORDER — SODIUM CHLORIDE 0.9 % IV BOLUS (SEPSIS)
1000.0000 mL | Freq: Once | INTRAVENOUS | Status: AC
  Administered 2015-07-13: 1000 mL via INTRAVENOUS

## 2015-07-13 MED ORDER — PANTOPRAZOLE SODIUM 40 MG PO TBEC
40.0000 mg | DELAYED_RELEASE_TABLET | Freq: Once | ORAL | Status: AC
  Administered 2015-07-13: 40 mg via ORAL
  Filled 2015-07-13: qty 1

## 2015-07-13 MED ORDER — ONDANSETRON HCL 4 MG PO TABS: 4.0000 mg | ORAL_TABLET | Freq: Three times a day (TID) | ORAL | Status: DC | PRN

## 2015-07-13 NOTE — Discharge Instructions (Signed)

## 2015-07-13 NOTE — ED Provider Notes (Addendum)
Time Seen: Approximately *1725  I have reviewed the triage notes  Chief Complaint: Abdominal Pain   History of Present Illness: Kevin Alexander is a 49 y.o. male who states a history of epigastric pain that occurred after some persistent nausea and vomiting and loose stool. Patient states his symptoms started approximately 4 days ago he's vomited "" 13-14 times today "". He denies any hematemesis. He states he's had 2 loose watery stools today with no signs of melena or hematochezia. He is not aware of any objective fever at home but states he's had some cold sweats and dizziness without any syncopal episode. He states he feels dehydrated. He states he's had a history of some chronic bowel issues but is not seen a gastroenterologist except for remote colonoscopy. He states his primary physician has given him a medication for esophageal reflux disease. He takes Prilosec twice a day.   Past Medical History  Diagnosis Date  . GERD (gastroesophageal reflux disease)   . Hx MRSA infection   . Anemia   . Hypertension   . Arthritis     hands, hip  . Aortic valve disorder     bicuspid valve  . Myocardial infarction [facility]) Aug 2016    "mild" - "went to [place] a few days later"  . Cough     sinus drainage (?)  . Seizures ([place])     (x1) - over 30 yrs ago - cause unknown  . Wears dentures     full upper and lower  . Wears hearing aid     right    Patient Active Problem List   Diagnosis Date Noted  . Deviated nasal septum 04/13/2015    Past Surgical History  Procedure Laterality Date  . Knee surgery Right 1990  . Hernia repair Bilateral 2002  . Colonoscopy  2000  . Joint replacement Left 2004    hip  . Septoplasty N/A 04/13/2015    Procedure: SEPTOPLASTY;  Surgeon: Clyde Canterbury, MD;  Location: [place];  Service: ENT;  Laterality: N/A;  . Turbinate resection Bilateral 04/13/2015    Procedure: SUBMUCOUS TURBINATE RESECTION ;  Surgeon: Clyde Canterbury, MD;  Location: [place];  Service: ENT;  Laterality: Bilateral;    Past Surgical History  Procedure Laterality Date  . Knee surgery Right 1990  . Hernia repair Bilateral 2002  . Colonoscopy  2000  . Joint replacement Left 2004    hip  . Septoplasty N/A 04/13/2015    Procedure: SEPTOPLASTY;  Surgeon: Clyde Canterbury, MD;  Location: [place];  Service: ENT;  Laterality: N/A;  . Turbinate resection Bilateral 04/13/2015    Procedure: SUBMUCOUS TURBINATE RESECTION ;  Surgeon: Clyde Canterbury, MD;  Location: [place];  Service: ENT;  Laterality: Bilateral;    Current Outpatient Rx  Name  Route  Sig  Dispense  Refill  . atorvastatin (LIPITOR) 40 MG tablet   Oral   Take 40 mg by mouth daily. AM         . cefPROZIL (CEFZIL) 500 MG tablet   Oral   Take 1 tablet (500 mg total) by mouth 2 (two) times daily.   20 tablet   0   . cetirizine (ZYRTEC) 10 MG tablet   Oral   Take 10 mg by mouth daily. PM      11   . escitalopram (LEXAPRO) 10 MG tablet   Oral   Take 10 mg by mouth daily. PM         .  fexofenadine (ALLEGRA) 180 MG tablet   Oral   Take 180 mg by mouth daily. AM      3   . gabapentin (NEURONTIN) 300 MG capsule   Oral   Take 300 mg by mouth 2 (two) times daily. Only taking PM      3   . HYDROcodone-acetaminophen (NORCO/VICODIN) 5-325 MG tablet   Oral   Take 1 tablet by mouth every 6 (six) hours as needed for moderate pain.   40 tablet   0   . Inulin (FIBER CHOICE PO)   Oral   Take by mouth daily.         . IRON CR PO   Oral   Take by mouth daily.         Marland Kitchen lisinopril (PRINIVIL,ZESTRIL) 40 MG tablet   Oral   Take 40 mg by mouth daily. AM         . lisinopril-hydrochlorothiazide (PRINZIDE,ZESTORETIC) 20-12.5 MG per tablet   Oral   Take 1 tablet by mouth daily.       0   . omeprazole (PRILOSEC) 40 MG capsule   Oral   Take 40 mg by mouth 2 (two) times daily.       3   . sertraline (ZOLOFT) 50 MG tablet   Oral   Take 75 mg by mouth  daily.      0     Allergies:  Amlodipine  Family History: No family history on file.  Social History: Social History  Substance Use Topics  . Smoking status: Current Every Day Smoker -- 1.00 packs/day for 30 years    Types: Cigarettes  . Smokeless tobacco: Never Used  . Alcohol Use: Yes     Comment: rare     Review of Systems:   10 point review of systems was performed and was otherwise negative:  Constitutional: No fever Eyes: No visual disturbances ENT: No sore throat, ear pain Cardiac: No chest pain Respiratory: No shortness of breath, wheezing, or stridor Abdomen: Patient points to the epigastric area without any issues of lower abdominal pain. Feels nauseated. He denies any recent antibiotic therapy. No recent travel Endocrine: No weight loss, No night sweats Extremities: No peripheral edema, cyanosis Skin: No rashes, easy bruising Neurologic: No focal weakness, trouble with speech or swollowing Urologic: No dysuria, Hematuria, or urinary frequency   Physical Exam:  ED Triage Vitals  Enc Vitals Group     BP 07/13/15 1626 142/93 mmHg     Pulse Rate 07/13/15 1626 128     Resp 07/13/15 1626 20     Temp 07/13/15 1626 98.5 F (36.9 C)     Temp Source 07/13/15 1626 Oral     SpO2 07/13/15 1626 100 %     Weight 07/13/15 1626 145 lb (65.772 kg)     Height 07/13/15 1626 5\' 9"  (1.753 m)     Head Cir --      Peak Flow --      Pain Score 07/13/15 1634 7     Pain Loc --      Pain Edu? --      Excl. in [place]? --     General: Awake , Alert , and Oriented times 3; GCS 15 Head: Normal cephalic , atraumatic Eyes: Pupils equal , round, reactive to light Nose/Throat: No nasal drainage, patent upper airway without erythema or exudate.  Neck: Supple, Full range of motion, No anterior adenopathy or palpable thyroid masses Lungs: Clear to ascultation without wheezes ,  rhonchi, or rales Heart: Regular rate, regular rhythm without murmurs , gallops , or rubs Abdomen: Soft,  non tender without rebound, guarding , or rigidity; bowel sounds positive and symmetric in all 4 quadrants. No organomegaly .       No peritoneal signs no focal tenderness over McBurney's point, negative Murphy's sign. Extremities: 2 plus symmetric pulses. No edema, clubbing or cyanosis Neurologic: normal ambulation, Motor symmetric without deficits, sensory intact Skin: warm, dry, no rashes   Labs:   All laboratory work was reviewed including any pertinent negatives or positives listed below:  Labs Reviewed  COMPREHENSIVE METABOLIC PANEL - Abnormal; Notable for the following:    CO2 20 (*)    Glucose, Bld 159 (*)    Creatinine, Ser 1.66 (*)    Total Protein 8.5 (*)    Alkaline Phosphatase 133 (*)    GFR calc non Af Amer 47 (*)    GFR calc Af Amer 54 (*)    All other components within normal limits  CBC - Abnormal; Notable for the following:    WBC 13.4 (*)    RDW 14.8 (*)    All other components within normal limits  LIPASE, BLOOD  TROPONIN I  URINALYSIS COMPLETEWITH MICROSCOPIC ([facility] ONLY)   reviewed the patient's laboratory work shows some dehydration with an elevated creatinine level. EKG: ED ECG REPORT I, Daymon Larsen, the attending physician, personally viewed and interpreted this ECG.  Date: 07/13/2015 EKG Time: 1625 Rate: 125 Rhythm: Sinus tachycardia QRS Axis: Leftt axis deviation with left anterior fascicular block Intervals: normal ST/T Wave abnormalities: normal Conduction Disutrbances: none Narrative Interpretation: unremarkable   Radiology: * I personally reviewed the radiologic studies  EXAM: DG ABDOMEN ACUTE W/ 1V CHEST  COMPARISON: CT abdomen and pelvis 09/10/2014. PA and lateral chest 12/25/2013.  FINDINGS: Single view of the chest demonstrates clear lungs and normal heart size. There is some peribronchial thickening and coarsening of the pulmonary interstitium, chronic.  Two views of the abdomen show no free intraperitoneal air. The  bowel gas pattern is nonobstructive. The patient is status post left hip replacement and hernia repair.  IMPRESSION: No acute finding chest or abdomen.   ED Course:  Patient's stay here showed symptomatic improvement. He was given a liter of fluid and some IV Zofran was able tolerate fluid intake. His heart rate has decreased and I felt that he would be stable for discharge at this point. Repeat exam of his abdomen shows no peritoneal signs and there is no signs of a bowel obstruction on his x-ray. Patient was advised to return here if he has bloody diarrhea, high fever, focal abdominal pain or any other new concerns.    Assessment:  Viral gastroenteritis     Plan:  Outpatient management Patient was advised to return immediately if condition worsens. Patient was advised to follow up with their primary care physician or other specialized physicians involved in their outpatient care             Daymon Larsen, MD 07/13/15 2017  Daymon Larsen, MD 07/13/15 2039

## 2015-07-13 NOTE — ED Notes (Addendum)
Pt reports epigastric pain for 4 days.  Pt sent from [facility] for evaluation.   Pt denies chest pain.  Pt also has sob.  cig smoker.  Pt reports vomiting every day.  Diarrhea x 2.  Pt alert.   Pt vomiting in triage.

## 2015-07-13 NOTE — ED Notes (Signed)
Pt unable to void at this time. 

## 2015-07-16 ENCOUNTER — Other Ambulatory Visit: Payer: Self-pay

## 2015-08-12 ENCOUNTER — Encounter: Payer: Self-pay | Admitting: *Deleted

## 2015-08-13 ENCOUNTER — Encounter: Payer: Self-pay | Admitting: Anesthesiology

## 2015-08-13 ENCOUNTER — Ambulatory Visit: Payer: Medicare Other | Admitting: Anesthesiology

## 2015-08-13 ENCOUNTER — Other Ambulatory Visit: Payer: Self-pay | Admitting: Gastroenterology

## 2015-08-13 ENCOUNTER — Ambulatory Visit
Admission: RE | Admit: 2015-08-13 | Discharge: 2015-08-13 | Disposition: A | Discharge status: Home / Self Care | Payer: Medicare Other | Source: Ambulatory Visit | Attending: Gastroenterology | Admitting: Gastroenterology

## 2015-08-13 ENCOUNTER — Encounter
Admission: RE | Disposition: A | Discharge status: Home / Self Care | Payer: Self-pay | Source: Ambulatory Visit | Attending: Gastroenterology

## 2015-08-13 DIAGNOSIS — M1990 Unspecified osteoarthritis, unspecified site: Secondary | ICD-10-CM | POA: Diagnosis not present

## 2015-08-13 DIAGNOSIS — J45909 Unspecified asthma, uncomplicated: Secondary | ICD-10-CM | POA: Diagnosis not present

## 2015-08-13 DIAGNOSIS — Z79899 Other long term (current) drug therapy: Secondary | ICD-10-CM | POA: Insufficient documentation

## 2015-08-13 DIAGNOSIS — Z7982 Long term (current) use of aspirin: Secondary | ICD-10-CM | POA: Insufficient documentation

## 2015-08-13 DIAGNOSIS — K210 Gastro-esophageal reflux disease with esophagitis: Secondary | ICD-10-CM | POA: Diagnosis not present

## 2015-08-13 DIAGNOSIS — R1310 Dysphagia, unspecified: Secondary | ICD-10-CM | POA: Insufficient documentation

## 2015-08-13 DIAGNOSIS — I2510 Atherosclerotic heart disease of native coronary artery without angina pectoris: Secondary | ICD-10-CM | POA: Diagnosis not present

## 2015-08-13 DIAGNOSIS — F17210 Nicotine dependence, cigarettes, uncomplicated: Secondary | ICD-10-CM | POA: Insufficient documentation

## 2015-08-13 DIAGNOSIS — Z87442 Personal history of urinary calculi: Secondary | ICD-10-CM | POA: Insufficient documentation

## 2015-08-13 DIAGNOSIS — F329 Major depressive disorder, single episode, unspecified: Secondary | ICD-10-CM | POA: Diagnosis not present

## 2015-08-13 DIAGNOSIS — I252 Old myocardial infarction: Secondary | ICD-10-CM | POA: Diagnosis not present

## 2015-08-13 DIAGNOSIS — I10 Essential (primary) hypertension: Secondary | ICD-10-CM | POA: Diagnosis not present

## 2015-08-13 DIAGNOSIS — K2970 Gastritis, unspecified, without bleeding: Secondary | ICD-10-CM | POA: Insufficient documentation

## 2015-08-13 DIAGNOSIS — F419 Anxiety disorder, unspecified: Secondary | ICD-10-CM | POA: Insufficient documentation

## 2015-08-13 DIAGNOSIS — R1013 Epigastric pain: Secondary | ICD-10-CM | POA: Diagnosis present

## 2015-08-13 DIAGNOSIS — Z8614 Personal history of Methicillin resistant Staphylococcus aureus infection: Secondary | ICD-10-CM | POA: Diagnosis not present

## 2015-08-13 DIAGNOSIS — R1906 Epigastric swelling, mass or lump: Secondary | ICD-10-CM

## 2015-08-13 DIAGNOSIS — K449 Diaphragmatic hernia without obstruction or gangrene: Secondary | ICD-10-CM | POA: Insufficient documentation

## 2015-08-13 HISTORY — DX: Pain due to internal orthopedic prosthetic devices, implants and grafts, initial encounter: T84.84XA

## 2015-08-13 HISTORY — PX: ESOPHAGOGASTRODUODENOSCOPY (EGD) WITH PROPOFOL: SHX5813

## 2015-08-13 HISTORY — DX: Personal history of other specified conditions: Z87.898

## 2015-08-13 HISTORY — DX: Depression, unspecified: F32.A

## 2015-08-13 HISTORY — DX: Fracture of unspecified part of neck of unspecified femur, initial encounter for closed fracture: S72.009A

## 2015-08-13 HISTORY — DX: Other seasonal allergic rhinitis: J30.2

## 2015-08-13 HISTORY — DX: Calculus of kidney: N20.0

## 2015-08-13 HISTORY — DX: Major depressive disorder, single episode, unspecified: F32.9

## 2015-08-13 HISTORY — DX: Personal history of traumatic brain injury: Z87.820

## 2015-08-13 HISTORY — DX: Unspecified asthma, uncomplicated: J45.909

## 2015-08-13 SURGERY — ESOPHAGOGASTRODUODENOSCOPY (EGD) WITH PROPOFOL
Anesthesia: General

## 2015-08-13 MED ORDER — FENTANYL CITRATE (PF) 100 MCG/2ML IJ SOLN
INTRAMUSCULAR | Status: DC | PRN
  Administered 2015-08-13: 50 ug via INTRAVENOUS

## 2015-08-13 MED ORDER — SODIUM CHLORIDE 0.9 % IV SOLN
INTRAVENOUS | Status: DC
  Administered 2015-08-13: 1000 mL via INTRAVENOUS

## 2015-08-13 MED ORDER — IPRATROPIUM-ALBUTEROL 0.5-2.5 (3) MG/3ML IN SOLN
RESPIRATORY_TRACT | Status: AC
  Administered 2015-08-13: 3 mL via RESPIRATORY_TRACT
  Filled 2015-08-13: qty 3

## 2015-08-13 MED ORDER — PROPOFOL 500 MG/50ML IV EMUL
INTRAVENOUS | Status: DC | PRN
  Administered 2015-08-13: 160 ug/kg/min via INTRAVENOUS

## 2015-08-13 MED ORDER — GLYCOPYRROLATE 0.2 MG/ML IJ SOLN
INTRAMUSCULAR | Status: DC | PRN
  Administered 2015-08-13: 0.2 mg via INTRAVENOUS

## 2015-08-13 MED ORDER — LIDOCAINE HCL (CARDIAC) 20 MG/ML IV SOLN
INTRAVENOUS | Status: DC | PRN
  Administered 2015-08-13: 80 mg via INTRAVENOUS

## 2015-08-13 MED ORDER — CARVEDILOL 12.5 MG PO TABS
ORAL_TABLET | ORAL | Status: AC
  Administered 2015-08-13: 12.5 mg
  Filled 2015-08-13: qty 1

## 2015-08-13 MED ORDER — IPRATROPIUM-ALBUTEROL 0.5-2.5 (3) MG/3ML IN SOLN
3.0000 mL | Freq: Once | RESPIRATORY_TRACT | Status: AC
  Administered 2015-08-13: 3 mL via RESPIRATORY_TRACT
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
## 2015-08-13 NOTE — Anesthesia Postprocedure Evaluation (Signed)
Anesthesia Post Note  Patient: Kevin Alexander  Procedure(s) Performed: Procedure(s) (LRB): ESOPHAGOGASTRODUODENOSCOPY (EGD) WITH PROPOFOL (N/A)  Patient location during evaluation: Endoscopy Anesthesia Type: General Level of consciousness: awake and alert Pain management: pain level controlled Vital Signs Assessment: post-procedure vital signs reviewed and stable Respiratory status: spontaneous breathing, nonlabored ventilation, respiratory function stable and patient connected to nasal cannula oxygen Cardiovascular status: blood pressure returned to baseline and stable Postop Assessment: no signs of nausea or vomiting Anesthetic complications: no    Last Vitals:  Filed Vitals:   08/13/15 1245 08/13/15 1255  BP: 154/98 170/98  Pulse: 61 55  Temp:    Resp: 15 15    Last Pain: There were no vitals filed for this visit.               Precious Haws Beck Cofer

## 2015-08-13 NOTE — Op Note (Signed)
[facility] Gastroenterology Patient Name: Kevin Alexander Procedure Date: 08/13/2015 12:06 PM MRN: [id number] Account #: [id number] Date of Birth: [birth year] Admit Type: Outpatient Age: 50 Room: [facility] ENDO ROOM 2 Gender: Male Note Status: Finalized Procedure:            Upper GI endoscopy Indications:          Epigastric abdominal pain, Dysphagia, Heartburn,                        Suspected esophageal reflux Patient Profile:      This is a 50 year old male. Providers:            Gerrit Heck. Rayann Heman, MD Referring MD:         Venetia Maxon. Elijio Miles, MD (Referring MD) Medicines:            Propofol per Anesthesia Complications:        No immediate complications. Procedure:            Pre-Anesthesia Assessment:                       - Prior to the procedure, a History and Physical was                        performed, and patient medications, allergies and                        sensitivities were reviewed. The patient's tolerance of                        previous anesthesia was reviewed.                       After obtaining informed consent, the endoscope was                        passed under direct vision. Throughout the procedure,                        the patient's blood pressure, pulse, and oxygen                        saturations were monitored continuously. The Endoscope                        was introduced through the mouth, and advanced to the                        second part of duodenum. The upper GI endoscopy was                        accomplished without difficulty. The patient tolerated                        the procedure well. Findings:      A small hiatal hernia was present.      The Z-line was irregular. Biopsies were taken with a cold forceps for       histology.      Mild inflammation characterized by erythema was found in the gastric       antrum.      The examined  duodenum was normal. Impression:           - Small hiatal hernia.            - Z-line irregular. Biopsied.                       - Gastritis.                       - Normal examined duodenum. Recommendation:       - Observe patient in GI recovery unit.                       - High fiber diet.                       - Continue present medications.                       - Await pathology results.                       - Continue prilosec, zantac                       - The findings and recommendations were discussed with                        the patient.                       - The findings and recommendations were discussed with                        the patient's family. Procedure Code(s):    --- Professional ---                       [phone], Esophagogastroduodenoscopy, flexible, transoral;                        with biopsy, single or multiple Diagnosis Code(s):    --- Professional ---                       K44.9, Diaphragmatic hernia without obstruction or                        gangrene                       K22.8, Other specified diseases of esophagus                       K29.70, Gastritis, unspecified, without bleeding                       R10.13, Epigastric pain                       R13.10, Dysphagia, unspecified                       R12, Heartburn CPT copyright 2016 [employer]. All rights reserved. The codes documented in this report are preliminary and upon coder review may  be revised to meet current compliance requirements. Mellody Life, MD 08/13/2015 12:22:20 PM  This report has been signed electronically. Number of Addenda: 0 Note Initiated On: 08/13/2015 12:06 PM      [facility]

## 2015-08-13 NOTE — Transfer of Care (Signed)
Immediate Anesthesia Transfer of Care Note  Patient: Kevin Alexander  Procedure(s) Performed: Procedure(s): ESOPHAGOGASTRODUODENOSCOPY (EGD) WITH PROPOFOL (N/A)  Patient Location: PACU and Endoscopy Unit  Anesthesia Type:General  Level of Consciousness: sedated  Airway & Oxygen Therapy: Patient Spontanous Breathing and Patient connected to nasal cannula oxygen  Post-op Assessment: Report given to RN and Post -op Vital signs reviewed and stable  Post vital signs: Reviewed and stable  Last Vitals: 12:24 64 hr 95% sat 14 res 109/72  Filed Vitals:   08/13/15 1053  BP: 156/77  Pulse: 59  Temp: 36.8 C  Resp: 16    Complications: No apparent anesthesia complications

## 2015-08-17 LAB — SURGICAL PATHOLOGY

## 2015-08-18 ENCOUNTER — Encounter: Payer: Self-pay | Admitting: Gastroenterology

## 2015-08-19 ENCOUNTER — Ambulatory Visit: Payer: Medicare Other

## 2015-08-23 ENCOUNTER — Ambulatory Visit
Admission: RE | Admit: 2015-08-23 | Discharge: 2015-08-23 | Disposition: A | Discharge status: Home / Self Care | Payer: Medicare Other | Source: Ambulatory Visit | Attending: Gastroenterology | Admitting: Gastroenterology

## 2015-08-23 DIAGNOSIS — M949 Disorder of cartilage, unspecified: Secondary | ICD-10-CM | POA: Insufficient documentation

## 2015-08-23 DIAGNOSIS — K449 Diaphragmatic hernia without obstruction or gangrene: Secondary | ICD-10-CM | POA: Insufficient documentation

## 2015-08-23 DIAGNOSIS — R1906 Epigastric swelling, mass or lump: Secondary | ICD-10-CM | POA: Diagnosis not present

## 2015-08-23 MED ORDER — IOHEXOL 300 MG/ML  SOLN
100.0000 mL | Freq: Once | INTRAMUSCULAR | Status: AC | PRN
  Administered 2015-08-23: 100 mL via INTRAVENOUS

## 2016-05-17 ENCOUNTER — Emergency Department
Admission: EM | Admit: 2016-05-17 | Discharge: 2016-05-17 | Disposition: A | Discharge status: Home / Self Care | Payer: Medicare Other | Attending: Emergency Medicine | Admitting: Emergency Medicine

## 2016-05-17 DIAGNOSIS — J45909 Unspecified asthma, uncomplicated: Secondary | ICD-10-CM | POA: Diagnosis not present

## 2016-05-17 DIAGNOSIS — Z79899 Other long term (current) drug therapy: Secondary | ICD-10-CM | POA: Diagnosis not present

## 2016-05-17 DIAGNOSIS — K2950 Unspecified chronic gastritis without bleeding: Secondary | ICD-10-CM

## 2016-05-17 DIAGNOSIS — E860 Dehydration: Secondary | ICD-10-CM | POA: Diagnosis not present

## 2016-05-17 DIAGNOSIS — Z7982 Long term (current) use of aspirin: Secondary | ICD-10-CM | POA: Insufficient documentation

## 2016-05-17 DIAGNOSIS — J029 Acute pharyngitis, unspecified: Secondary | ICD-10-CM | POA: Insufficient documentation

## 2016-05-17 DIAGNOSIS — I252 Old myocardial infarction: Secondary | ICD-10-CM | POA: Insufficient documentation

## 2016-05-17 DIAGNOSIS — Z791 Long term (current) use of non-steroidal anti-inflammatories (NSAID): Secondary | ICD-10-CM | POA: Insufficient documentation

## 2016-05-17 DIAGNOSIS — I10 Essential (primary) hypertension: Secondary | ICD-10-CM | POA: Diagnosis not present

## 2016-05-17 DIAGNOSIS — F17210 Nicotine dependence, cigarettes, uncomplicated: Secondary | ICD-10-CM | POA: Insufficient documentation

## 2016-05-17 DIAGNOSIS — R1084 Generalized abdominal pain: Secondary | ICD-10-CM | POA: Diagnosis present

## 2016-05-17 LAB — COMPREHENSIVE METABOLIC PANEL
ALT: 9 U/L — ABNORMAL LOW (ref 17–63)
AST: 20 U/L (ref 15–41)
Albumin: 3.6 g/dL (ref 3.5–5.0)
Alkaline Phosphatase: 148 U/L — ABNORMAL HIGH (ref 38–126)
Anion gap: 11 (ref 5–15)
BUN: 6 mg/dL (ref 6–20)
CO2: 25 mmol/L (ref 22–32)
Calcium: 9 mg/dL (ref 8.9–10.3)
Chloride: 102 mmol/L (ref 101–111)
Creatinine, Ser: 0.75 mg/dL (ref 0.61–1.24)
GFR calc Af Amer: >60 mL/min (ref >60)
GFR calc non Af Amer: >60 mL/min (ref >60)
Glucose, Bld: 120 mg/dL — ABNORMAL HIGH (ref 65–99)
Potassium: 3.4 mmol/L — ABNORMAL LOW (ref 3.5–5.1)
Sodium: 138 mmol/L (ref 135–145)
Total Bilirubin: 0.5 mg/dL (ref 0.3–1.2)
Total Protein: 7.4 g/dL (ref 6.5–8.1)

## 2016-05-17 LAB — CBC
HCT: 34.9 % — ABNORMAL LOW (ref 40.0–52.0)
Hemoglobin: 11.7 g/dL — ABNORMAL LOW (ref 13.0–18.0)
MCH: 32.1 pg (ref 26.0–34.0)
MCHC: 33.6 g/dL (ref 32.0–36.0)
MCV: 95.6 fL (ref 80.0–100.0)
Platelets: 272 10*3/uL (ref 150–440)
RBC: 3.66 MIL/uL — ABNORMAL LOW (ref 4.40–5.90)
RDW: 17.2 % — ABNORMAL HIGH (ref 11.5–14.5)
WBC: 3.3 10*3/uL — ABNORMAL LOW (ref 3.8–10.6)

## 2016-05-17 LAB — LIPASE, BLOOD: Lipase: 30 U/L (ref 11–51)

## 2016-05-17 MED ORDER — METOCLOPRAMIDE HCL 10 MG PO TABS: 10.0000 mg | ORAL_TABLET | Freq: Four times a day (QID) | ORAL | 0 refills | Status: DC | PRN

## 2016-05-17 MED ORDER — METOCLOPRAMIDE HCL 5 MG/ML IJ SOLN
10.0000 mg | Freq: Once | INTRAMUSCULAR | Status: AC
  Administered 2016-05-17: 10 mg via INTRAVENOUS
  Filled 2016-05-17: qty 2

## 2016-05-17 MED ORDER — SUCRALFATE 1 G PO TABS: 1.0000 g | ORAL_TABLET | Freq: Four times a day (QID) | ORAL | 1 refills | Status: DC

## 2016-05-17 MED ORDER — SODIUM CHLORIDE 0.9 % IV BOLUS (SEPSIS)
1000.0000 mL | Freq: Once | INTRAVENOUS | Status: AC
  Administered 2016-05-17: 1000 mL via INTRAVENOUS

## 2016-05-17 MED ORDER — KETOROLAC TROMETHAMINE 30 MG/ML IJ SOLN
15.0000 mg | INTRAMUSCULAR | Status: AC
  Administered 2016-05-17: 15 mg via INTRAVENOUS
  Filled 2016-05-17: qty 1

## 2016-05-17 NOTE — ED Triage Notes (Signed)
Pt arrives today with reports of nausea with vomiting for the last couple of days   Pt reports weakness  Hx  Hiatal hernia, recent ECHO showed aortic stenosis  3/10 chest pain reported in triage
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
## 2016-05-17 NOTE — ED Notes (Addendum)
Pt states he is feeling better, color has improved, pt talking louder. Sitting on edge of bed. Pt given ginger ale and saltines for PO challenge.

## 2016-05-17 NOTE — ED Notes (Signed)
Pt states unable to keep anything down, states vomiting everything back up. Pt talking very quietly. Pt states cold and hot feeling, will wake up soaked at night. Pt states body aches, sore throat. Sore all over abdomen.

## 2016-05-31 ENCOUNTER — Ambulatory Visit
Admission: RE | Admit: 2016-05-31 | Discharge: 2016-05-31 | Disposition: A | Discharge status: Home / Self Care | Payer: Medicare Other | Source: Ambulatory Visit | Attending: Student | Admitting: Student

## 2016-05-31 ENCOUNTER — Other Ambulatory Visit: Payer: Self-pay | Admitting: Student

## 2016-05-31 DIAGNOSIS — K311 Adult hypertrophic pyloric stenosis: Secondary | ICD-10-CM | POA: Diagnosis not present

## 2016-05-31 DIAGNOSIS — R112 Nausea with vomiting, unspecified: Secondary | ICD-10-CM

## 2016-05-31 DIAGNOSIS — R634 Abnormal weight loss: Secondary | ICD-10-CM

## 2016-05-31 DIAGNOSIS — K30 Functional dyspepsia: Secondary | ICD-10-CM | POA: Insufficient documentation

## 2016-05-31 DIAGNOSIS — K229 Disease of esophagus, unspecified: Secondary | ICD-10-CM | POA: Diagnosis not present

## 2016-05-31 DIAGNOSIS — R1310 Dysphagia, unspecified: Secondary | ICD-10-CM | POA: Insufficient documentation

## 2016-06-01 ENCOUNTER — Encounter: Payer: Self-pay | Admitting: *Deleted

## 2016-06-02 ENCOUNTER — Ambulatory Visit
Admission: RE | Admit: 2016-06-02 | Discharge: 2016-06-02 | Disposition: A | Discharge status: Home / Self Care | Payer: Medicare Other | Source: Ambulatory Visit | Attending: Unknown Physician Specialty | Admitting: Unknown Physician Specialty

## 2016-06-02 ENCOUNTER — Encounter
Admission: RE | Disposition: A | Discharge status: Home / Self Care | Payer: Self-pay | Source: Ambulatory Visit | Attending: Unknown Physician Specialty

## 2016-06-02 ENCOUNTER — Ambulatory Visit: Payer: Medicare Other | Admitting: Anesthesiology

## 2016-06-02 ENCOUNTER — Encounter: Payer: Self-pay | Admitting: *Deleted

## 2016-06-02 DIAGNOSIS — I10 Essential (primary) hypertension: Secondary | ICD-10-CM | POA: Insufficient documentation

## 2016-06-02 DIAGNOSIS — Z6820 Body mass index (BMI) 20.0-20.9, adult: Secondary | ICD-10-CM | POA: Diagnosis not present

## 2016-06-02 DIAGNOSIS — K219 Gastro-esophageal reflux disease without esophagitis: Secondary | ICD-10-CM | POA: Insufficient documentation

## 2016-06-02 DIAGNOSIS — D649 Anemia, unspecified: Secondary | ICD-10-CM | POA: Insufficient documentation

## 2016-06-02 DIAGNOSIS — F17210 Nicotine dependence, cigarettes, uncomplicated: Secondary | ICD-10-CM | POA: Insufficient documentation

## 2016-06-02 DIAGNOSIS — I358 Other nonrheumatic aortic valve disorders: Secondary | ICD-10-CM | POA: Diagnosis not present

## 2016-06-02 DIAGNOSIS — Z79899 Other long term (current) drug therapy: Secondary | ICD-10-CM | POA: Diagnosis not present

## 2016-06-02 DIAGNOSIS — R1310 Dysphagia, unspecified: Secondary | ICD-10-CM | POA: Insufficient documentation

## 2016-06-02 DIAGNOSIS — Z7982 Long term (current) use of aspirin: Secondary | ICD-10-CM | POA: Diagnosis not present

## 2016-06-02 DIAGNOSIS — R634 Abnormal weight loss: Secondary | ICD-10-CM | POA: Insufficient documentation

## 2016-06-02 DIAGNOSIS — F329 Major depressive disorder, single episode, unspecified: Secondary | ICD-10-CM | POA: Diagnosis not present

## 2016-06-02 DIAGNOSIS — K449 Diaphragmatic hernia without obstruction or gangrene: Secondary | ICD-10-CM | POA: Insufficient documentation

## 2016-06-02 DIAGNOSIS — B3781 Candidal esophagitis: Secondary | ICD-10-CM | POA: Insufficient documentation

## 2016-06-02 DIAGNOSIS — J302 Other seasonal allergic rhinitis: Secondary | ICD-10-CM | POA: Diagnosis not present

## 2016-06-02 DIAGNOSIS — K2960 Other gastritis without bleeding: Secondary | ICD-10-CM | POA: Insufficient documentation

## 2016-06-02 DIAGNOSIS — I252 Old myocardial infarction: Secondary | ICD-10-CM | POA: Insufficient documentation

## 2016-06-02 HISTORY — PX: ESOPHAGOGASTRODUODENOSCOPY (EGD) WITH PROPOFOL: SHX5813

## 2016-06-02 HISTORY — DX: Peptic ulcer, site unspecified, unspecified as acute or chronic, without hemorrhage or perforation: K27.9

## 2016-06-02 HISTORY — DX: Personal history of urinary calculi: Z87.442

## 2016-06-02 LAB — KOH PREP

## 2016-06-02 SURGERY — ESOPHAGOGASTRODUODENOSCOPY (EGD) WITH PROPOFOL
Anesthesia: General

## 2016-06-02 MED ORDER — PROPOFOL 10 MG/ML IV BOLUS
INTRAVENOUS | Status: DC | PRN
  Administered 2016-06-02: 70 mg via INTRAVENOUS

## 2016-06-02 MED ORDER — SODIUM CHLORIDE 0.9 % IV SOLN: INTRAVENOUS | Status: DC

## 2016-06-02 MED ORDER — FENTANYL CITRATE (PF) 100 MCG/2ML IJ SOLN
INTRAMUSCULAR | Status: DC | PRN
  Administered 2016-06-02: 50 ug via INTRAVENOUS

## 2016-06-02 MED ORDER — PROPOFOL 500 MG/50ML IV EMUL
INTRAVENOUS | Status: DC | PRN
  Administered 2016-06-02: 120 ug/kg/min via INTRAVENOUS

## 2016-06-02 MED ORDER — GLYCOPYRROLATE 0.2 MG/ML IJ SOLN
INTRAMUSCULAR | Status: DC | PRN
  Administered 2016-06-02: 0.2 mg via INTRAVENOUS

## 2016-06-02 MED ORDER — MIDAZOLAM HCL 5 MG/5ML IJ SOLN
INTRAMUSCULAR | Status: DC | PRN
  Administered 2016-06-02: 1 mg via INTRAVENOUS

## 2016-06-02 MED ORDER — SODIUM CHLORIDE 0.9 % IV SOLN
INTRAVENOUS | Status: DC
  Administered 2016-06-02: 1000 mL via INTRAVENOUS

## 2016-06-02 MED ORDER — LIDOCAINE 2% (20 MG/ML) 5 ML SYRINGE
INTRAMUSCULAR | Status: DC | PRN
  Administered 2016-06-02: 25 mg via INTRAVENOUS
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
## 2016-06-02 NOTE — Anesthesia Postprocedure Evaluation (Signed)
Anesthesia Post Note  Patient: Kevin Alexander  Procedure(s) Performed: Procedure(s) (LRB): ESOPHAGOGASTRODUODENOSCOPY (EGD) WITH PROPOFOL (N/A)  Patient location during evaluation: Other Anesthesia Type: General Level of consciousness: awake and alert Pain management: pain level controlled Vital Signs Assessment: post-procedure vital signs reviewed and stable Respiratory status: spontaneous breathing, nonlabored ventilation, respiratory function stable and patient connected to nasal cannula oxygen Cardiovascular status: blood pressure returned to baseline and stable Postop Assessment: no signs of nausea or vomiting Anesthetic complications: no    Last Vitals:  Vitals:   06/02/16 1529 06/02/16 1539  BP: 138/67 134/75  Pulse: 68 74  Resp: 16 18  Temp:      Last Pain:  Vitals:   06/02/16 1509  TempSrc: Tympanic                 Shahir Karen S

## 2016-06-02 NOTE — Anesthesia Postprocedure Evaluation (Signed)
Anesthesia Post Note  Patient: Kevin Alexander  Procedure(s) Performed: Procedure(s) (LRB): ESOPHAGOGASTRODUODENOSCOPY (EGD) WITH PROPOFOL (N/A)  Anesthesia Post Evaluation  Last Vitals:  Vitals:   06/02/16 1529 06/02/16 1539  BP: 138/67 134/75  Pulse: 68 74  Resp: 16 18  Temp:      Last Pain:  Vitals:   06/02/16 1509  TempSrc: Tympanic                 Laurianne Floresca S

## 2016-06-02 NOTE — Op Note (Signed)
[facility] Gastroenterology Patient Name: Kevin Alexander Procedure Date: 06/02/2016 2:53 PM MRN: [id number] Account #: [id number] Date of Birth: 06/24/1965 Admit Type: Outpatient Age: 50 Room: [facility] ENDO ROOM 4 Gender: Male Note Status: Finalized Procedure:            Upper GI endoscopy Indications:          Dysphagia Providers:            Manya Silvas, MD Referring MD:         Venetia Maxon. Elijio Miles, MD (Referring MD) Medicines:            Propofol per Anesthesia Complications:        No immediate complications. Procedure:            Pre-Anesthesia Assessment:                       - After reviewing the risks and benefits, the patient                        was deemed in satisfactory condition to undergo the                        procedure.                       After obtaining informed consent, the endoscope was                        passed under direct vision. Throughout the procedure,                        the patient's blood pressure, pulse, and oxygen                        saturations were monitored continuously. The                        Colonoscope was introduced through the mouth, and                        advanced to the second part of duodenum. The upper GI                        endoscopy was accomplished without difficulty. The                        patient tolerated the procedure well. Findings:      Esophagitis with no bleeding was found 18 cm from the incisors.       Brushings for KOH prep were obtained in the proximal esophagus, in the       mid esophagus and in the distal esophagus.      A small hiatal hernia was present.      Diffuse mild inflammation characterized by erythema and granularity was       found in the gastric body and in the gastric antrum. Biopsies were taken       with a cold forceps for histology. Biopsies were taken with a cold       forceps for histology. Flecks of blood seen scattered on body of stomach.      The  examined duodenum was normal. Impression:           -  Candidiasis esophagitis. Brushings performed.                       - Small hiatal hernia.                       - Gastritis. Biopsied.                       - Normal examined duodenum. Recommendation:       - Await pathology results. Take medicine. Manya Silvas, MD 06/02/2016 3:12:04 PM This report has been signed electronically. Number of Addenda: 0 Note Initiated On: 06/02/2016 2:53 PM      [facility]

## 2016-06-02 NOTE — Transfer of Care (Signed)
Immediate Anesthesia Transfer of Care Note  Patient: Kevin Alexander  Procedure(s) Performed: Procedure(s): ESOPHAGOGASTRODUODENOSCOPY (EGD) WITH PROPOFOL (N/A)  Patient Location: Endoscopy Unit  Anesthesia Type:General  Level of Consciousness: awake  Airway & Oxygen Therapy: Patient Spontanous Breathing and Patient connected to nasal cannula oxygen  Post-op Assessment: Report given to RN and Post -op Vital signs reviewed and stable  Post vital signs: Reviewed  Last Vitals:  Vitals:   06/02/16 1402 06/02/16 1510  BP: 130/81 90/62  Pulse: 68 77  Resp: 18 15  Temp: (!) 35.7 C 36.1 C    Last Pain:  Vitals:   06/02/16 1402  TempSrc: Tympanic         Complications: No apparent anesthesia complications
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
## 2016-06-03 ENCOUNTER — Encounter: Payer: Self-pay | Admitting: Unknown Physician Specialty

## 2016-06-06 LAB — SURGICAL PATHOLOGY

## 2016-06-08 ENCOUNTER — Other Ambulatory Visit: Payer: Self-pay | Admitting: Unknown Physician Specialty

## 2016-06-08 DIAGNOSIS — R112 Nausea with vomiting, unspecified: Secondary | ICD-10-CM

## 2016-06-14 ENCOUNTER — Encounter
Admission: RE | Admit: 2016-06-14 | Discharge: 2016-06-14 | Disposition: A | Discharge status: Home / Self Care | Payer: Medicare Other | Source: Ambulatory Visit | Attending: Unknown Physician Specialty | Admitting: Unknown Physician Specialty

## 2016-06-14 DIAGNOSIS — R112 Nausea with vomiting, unspecified: Secondary | ICD-10-CM

## 2016-06-15 ENCOUNTER — Encounter: Payer: Self-pay | Admitting: Emergency Medicine

## 2016-06-15 ENCOUNTER — Observation Stay
Admission: EM | Admit: 2016-06-15 | Discharge: 2016-06-19 | Disposition: A | Discharge status: Home / Self Care | Payer: Medicare Other | Attending: Internal Medicine | Admitting: Internal Medicine

## 2016-06-15 DIAGNOSIS — K210 Gastro-esophageal reflux disease with esophagitis: Secondary | ICD-10-CM | POA: Insufficient documentation

## 2016-06-15 DIAGNOSIS — B3781 Candidal esophagitis: Secondary | ICD-10-CM | POA: Diagnosis not present

## 2016-06-15 DIAGNOSIS — Z79899 Other long term (current) drug therapy: Secondary | ICD-10-CM | POA: Insufficient documentation

## 2016-06-15 DIAGNOSIS — E876 Hypokalemia: Secondary | ICD-10-CM | POA: Diagnosis not present

## 2016-06-15 DIAGNOSIS — Z8711 Personal history of peptic ulcer disease: Secondary | ICD-10-CM | POA: Insufficient documentation

## 2016-06-15 DIAGNOSIS — F17210 Nicotine dependence, cigarettes, uncomplicated: Secondary | ICD-10-CM | POA: Insufficient documentation

## 2016-06-15 DIAGNOSIS — I252 Old myocardial infarction: Secondary | ICD-10-CM | POA: Insufficient documentation

## 2016-06-15 DIAGNOSIS — R21 Rash and other nonspecific skin eruption: Secondary | ICD-10-CM | POA: Insufficient documentation

## 2016-06-15 DIAGNOSIS — R112 Nausea with vomiting, unspecified: Secondary | ICD-10-CM | POA: Diagnosis not present

## 2016-06-15 DIAGNOSIS — F329 Major depressive disorder, single episode, unspecified: Secondary | ICD-10-CM | POA: Diagnosis not present

## 2016-06-15 DIAGNOSIS — I10 Essential (primary) hypertension: Secondary | ICD-10-CM | POA: Insufficient documentation

## 2016-06-15 DIAGNOSIS — Z96641 Presence of right artificial hip joint: Secondary | ICD-10-CM | POA: Diagnosis not present

## 2016-06-15 DIAGNOSIS — Z7982 Long term (current) use of aspirin: Secondary | ICD-10-CM | POA: Diagnosis not present

## 2016-06-15 DIAGNOSIS — R1110 Vomiting, unspecified: Secondary | ICD-10-CM | POA: Diagnosis present

## 2016-06-15 LAB — BASIC METABOLIC PANEL
Anion gap: 12 (ref 5–15)
BUN: 9 mg/dL (ref 6–20)
CO2: 29 mmol/L (ref 22–32)
Calcium: 9.4 mg/dL (ref 8.9–10.3)
Chloride: 94 mmol/L — ABNORMAL LOW (ref 101–111)
Creatinine, Ser: 0.78 mg/dL (ref 0.61–1.24)
GFR calc Af Amer: >60 mL/min (ref >60)
GFR calc non Af Amer: >60 mL/min (ref >60)
Glucose, Bld: 129 mg/dL — ABNORMAL HIGH (ref 65–99)
Potassium: 2.8 mmol/L — ABNORMAL LOW (ref 3.5–5.1)
Sodium: 135 mmol/L (ref 135–145)

## 2016-06-15 LAB — URINALYSIS, COMPLETE (UACMP) WITH MICROSCOPIC
Bilirubin Urine: NEGATIVE
Glucose, UA: NEGATIVE mg/dL
Hgb urine dipstick: NEGATIVE
Ketones, ur: NEGATIVE mg/dL
Leukocytes, UA: NEGATIVE
Nitrite: NEGATIVE
Protein, ur: NEGATIVE mg/dL
Specific Gravity, Urine: 1.008 (ref 1.005–1.030)
pH: 7 (ref 5.0–8.0)

## 2016-06-15 LAB — CBC
HCT: 40 % (ref 40.0–52.0)
Hemoglobin: 12.8 g/dL — ABNORMAL LOW (ref 13.0–18.0)
MCH: 28.7 pg (ref 26.0–34.0)
MCHC: 32.1 g/dL (ref 32.0–36.0)
MCV: 89.4 fL (ref 80.0–100.0)
Platelets: 285 10*3/uL (ref 150–440)
RBC: 4.47 MIL/uL (ref 4.40–5.90)
RDW: 16.4 % — ABNORMAL HIGH (ref 11.5–14.5)
WBC: 7.6 10*3/uL (ref 3.8–10.6)

## 2016-06-15 LAB — PHOSPHORUS: Phosphorus: 3.5 mg/dL (ref 2.5–4.6)

## 2016-06-15 LAB — RAPID HIV SCREEN (HIV 1/2 AB+AG)
HIV 1/2 Antibodies: NONREACTIVE
HIV-1 P24 Antigen - HIV24: NONREACTIVE

## 2016-06-15 LAB — MAGNESIUM: Magnesium: 2.2 mg/dL (ref 1.7–2.4)

## 2016-06-15 MED ORDER — NYSTATIN 100000 UNIT/ML MT SUSP
10.0000 mL | Freq: Two times a day (BID) | OROMUCOSAL | Status: DC
  Administered 2016-06-15 – 2016-06-19 (×8): 1000000 [IU] via ORAL
  Filled 2016-06-15 (×7): qty 10
  Filled 2016-06-15: qty 5
  Filled 2016-06-15: qty 10

## 2016-06-15 MED ORDER — LISINOPRIL 20 MG PO TABS
40.0000 mg | ORAL_TABLET | Freq: Every day | ORAL | Status: DC
  Administered 2016-06-15 – 2016-06-19 (×5): 40 mg via ORAL
  Filled 2016-06-15 (×5): qty 2

## 2016-06-15 MED ORDER — CARVEDILOL 12.5 MG PO TABS
12.5000 mg | ORAL_TABLET | Freq: Two times a day (BID) | ORAL | Status: DC
  Administered 2016-06-16 – 2016-06-19 (×6): 12.5 mg via ORAL
  Filled 2016-06-15 (×6): qty 1

## 2016-06-15 MED ORDER — ENOXAPARIN SODIUM 40 MG/0.4ML ~~LOC~~ SOLN
40.0000 mg | SUBCUTANEOUS | Status: DC
  Administered 2016-06-15 – 2016-06-18 (×4): 40 mg via SUBCUTANEOUS
  Filled 2016-06-15 (×4): qty 0.4

## 2016-06-15 MED ORDER — ATORVASTATIN CALCIUM 20 MG PO TABS
40.0000 mg | ORAL_TABLET | Freq: Every day | ORAL | Status: DC
  Administered 2016-06-15 – 2016-06-19 (×5): 40 mg via ORAL
  Filled 2016-06-15 (×5): qty 2

## 2016-06-15 MED ORDER — DIPHENHYDRAMINE HCL 25 MG PO CAPS
25.0000 mg | ORAL_CAPSULE | Freq: Four times a day (QID) | ORAL | Status: DC | PRN
  Administered 2016-06-15 – 2016-06-19 (×6): 25 mg via ORAL
  Filled 2016-06-15 (×6): qty 1

## 2016-06-15 MED ORDER — MORPHINE SULFATE (PF) 4 MG/ML IV SOLN
4.0000 mg | Freq: Once | INTRAVENOUS | Status: AC
Start: 2016-06-15 — End: 2016-06-15
  Administered 2016-06-15: 4 mg via INTRAVENOUS
  Filled 2016-06-15: qty 1

## 2016-06-15 MED ORDER — MORPHINE SULFATE (PF) 4 MG/ML IV SOLN
4.0000 mg | Freq: Once | INTRAVENOUS | Status: AC
  Administered 2016-06-15: 4 mg via INTRAVENOUS
  Filled 2016-06-15: qty 1

## 2016-06-15 MED ORDER — SODIUM CHLORIDE 0.9 % IV BOLUS (SEPSIS)
1000.0000 mL | Freq: Once | INTRAVENOUS | Status: AC
  Administered 2016-06-15: 1000 mL via INTRAVENOUS

## 2016-06-15 MED ORDER — SODIUM CHLORIDE 0.9 % IV SOLN
Freq: Once | INTRAVENOUS | Status: AC
  Administered 2016-06-15: 16:00:00 via INTRAVENOUS
  Filled 2016-06-15: qty 1000

## 2016-06-15 MED ORDER — PROMETHAZINE HCL 25 MG/ML IJ SOLN
INTRAMUSCULAR | Status: AC
  Filled 2016-06-15: qty 1

## 2016-06-15 MED ORDER — ACETAMINOPHEN 325 MG PO TABS: 650.0000 mg | ORAL_TABLET | Freq: Four times a day (QID) | ORAL | Status: DC | PRN

## 2016-06-15 MED ORDER — GABAPENTIN 300 MG PO CAPS
300.0000 mg | ORAL_CAPSULE | Freq: Two times a day (BID) | ORAL | Status: DC
  Administered 2016-06-15 – 2016-06-19 (×8): 300 mg via ORAL
  Filled 2016-06-15 (×8): qty 1

## 2016-06-15 MED ORDER — KETOROLAC TROMETHAMINE 30 MG/ML IJ SOLN: 30.0000 mg | Freq: Four times a day (QID) | INTRAMUSCULAR | Status: DC | PRN

## 2016-06-15 MED ORDER — ALBUTEROL SULFATE (2.5 MG/3ML) 0.083% IN NEBU: 2.5000 mg | INHALATION_SOLUTION | RESPIRATORY_TRACT | Status: DC | PRN

## 2016-06-15 MED ORDER — ONDANSETRON HCL 4 MG/2ML IJ SOLN
4.0000 mg | Freq: Once | INTRAMUSCULAR | Status: AC
  Administered 2016-06-15: 4 mg via INTRAVENOUS
  Filled 2016-06-15: qty 2

## 2016-06-15 MED ORDER — ESCITALOPRAM OXALATE 10 MG PO TABS
10.0000 mg | ORAL_TABLET | Freq: Every day | ORAL | Status: DC
  Administered 2016-06-15 – 2016-06-19 (×5): 10 mg via ORAL
  Filled 2016-06-15 (×5): qty 1

## 2016-06-15 MED ORDER — PROMETHAZINE HCL 25 MG/ML IJ SOLN
25.0000 mg | Freq: Once | INTRAMUSCULAR | Status: AC
  Administered 2016-06-15: 25 mg via INTRAVENOUS
  Filled 2016-06-15: qty 1

## 2016-06-15 MED ORDER — HYDROCODONE-ACETAMINOPHEN 5-325 MG PO TABS
1.0000 | ORAL_TABLET | ORAL | Status: DC | PRN
  Administered 2016-06-16: 15:00:00 2 via ORAL
  Administered 2016-06-16: 1 via ORAL
  Administered 2016-06-16 – 2016-06-17 (×2): 2 via ORAL
  Administered 2016-06-17: 1 via ORAL
  Administered 2016-06-17: 17:00:00 2 via ORAL
  Administered 2016-06-18 – 2016-06-19 (×3): 1 via ORAL
  Filled 2016-06-15: qty 1
  Filled 2016-06-15: qty 2
  Filled 2016-06-15 (×2): qty 1
  Filled 2016-06-15: qty 2
  Filled 2016-06-15: qty 1
  Filled 2016-06-15 (×3): qty 2

## 2016-06-15 MED ORDER — ONDANSETRON HCL 4 MG PO TABS
4.0000 mg | ORAL_TABLET | Freq: Four times a day (QID) | ORAL | Status: DC | PRN
  Administered 2016-06-16 – 2016-06-18 (×5): 4 mg via ORAL
  Filled 2016-06-15 (×5): qty 1

## 2016-06-15 MED ORDER — ACETAMINOPHEN 650 MG RE SUPP: 650.0000 mg | Freq: Four times a day (QID) | RECTAL | Status: DC | PRN

## 2016-06-15 MED ORDER — ONDANSETRON HCL 4 MG/2ML IJ SOLN
4.0000 mg | Freq: Four times a day (QID) | INTRAMUSCULAR | Status: DC | PRN
  Administered 2016-06-16: 4 mg via INTRAVENOUS
  Filled 2016-06-15: qty 2

## 2016-06-15 MED ORDER — POTASSIUM CHLORIDE IN NACL 40-0.9 MEQ/L-% IV SOLN
INTRAVENOUS | Status: DC
  Administered 2016-06-15 – 2016-06-16 (×2): 100 mL/h via INTRAVENOUS
  Filled 2016-06-15 (×4): qty 1000

## 2016-06-15 MED ORDER — POTASSIUM CHLORIDE 20 MEQ/15ML (10%) PO SOLN
40.0000 meq | Freq: Once | ORAL | Status: AC
  Administered 2016-06-15: 21:00:00 40 meq via ORAL
  Filled 2016-06-15: qty 30
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
## 2016-06-15 NOTE — ED Notes (Signed)
CBC collected, sent to lab.

## 2016-06-15 NOTE — ED Triage Notes (Signed)
Pt to ed with c/o decreased appetite and weight loss over the last month.  Pt states he has vomiting everyday and has had very little PO intake.  Reports diarrhea intermittently over the last week.  Pt alert and oriented, reports sent to ED by Dr Vira Agar.
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
## 2016-06-15 NOTE — ED Notes (Signed)
Chaplain paged per patient's request.

## 2016-06-15 NOTE — Progress Notes (Signed)
Chaplain received a page to visit with pt in room Ed11. Provided education for an Scientist, physiological.    06/15/16 1700  Clinical Encounter Type  Visited With Patient;Patient and family together  Visit Type Initial  Referral From Nurse  Consult/Referral To Chaplain  Spiritual Encounters  Spiritual Needs Other (Comment)

## 2016-06-16 LAB — BASIC METABOLIC PANEL
Anion gap: 4 — ABNORMAL LOW (ref 5–15)
BUN: <5 mg/dL — ABNORMAL LOW (ref 6–20)
CO2: 25 mmol/L (ref 22–32)
Calcium: 7.9 mg/dL — ABNORMAL LOW (ref 8.9–10.3)
Chloride: 110 mmol/L (ref 101–111)
Creatinine, Ser: 0.64 mg/dL (ref 0.61–1.24)
GFR calc Af Amer: >60 mL/min (ref >60)
GFR calc non Af Amer: >60 mL/min (ref >60)
Glucose, Bld: 84 mg/dL (ref 65–99)
Potassium: 4.9 mmol/L (ref 3.5–5.1)
Sodium: 139 mmol/L (ref 135–145)

## 2016-06-16 LAB — HIV ANTIBODY (ROUTINE TESTING W REFLEX): HIV Screen 4th Generation wRfx: NONREACTIVE

## 2016-06-16 MED ORDER — SODIUM CHLORIDE 0.9 % IV SOLN
INTRAVENOUS | Status: AC
  Administered 2016-06-16 – 2016-06-17 (×2): via INTRAVENOUS

## 2016-06-16 MED ORDER — FLUCONAZOLE 50 MG PO TABS
100.0000 mg | ORAL_TABLET | Freq: Every day | ORAL | Status: DC
  Administered 2016-06-16 – 2016-06-18 (×3): 100 mg via ORAL
  Filled 2016-06-16 (×3): qty 2

## 2016-06-16 NOTE — Progress Notes (Signed)
Initial Nutrition Assessment  DOCUMENTATION CODES:   Severe malnutrition in context of chronic illness  INTERVENTION:  1. Monitor for diet advancement per MD/NP/PA 2. Provide ONS as we determine if patient can tolerate PO intake  NUTRITION DIAGNOSIS:   Malnutrition related to chronic illness as evidenced by percent weight loss, moderate depletion of body fat, moderate depletions of muscle mass, energy intake < 75% for > or equal to 1 month.  GOAL:   Patient will meet greater than or equal to 90% of their needs  MONITOR:   PO intake, I & O's, Labs, Weight trends, Supplement acceptance  REASON FOR ASSESSMENT:   Malnutrition Screening Tool    ASSESSMENT:   Kevin Alexander  is a 50 y.o. male with a known history of Esophagitis Candidasis, PUD, hypertension, asthma and GERD. The patient has had nausea and vomiting for 1 month, which has been worsening for a couple days. Dr. Tiffany Kocher did colonoscopy 2 weeks ago, which showed diffuse esophageal candidiasis  Spoke with [name] at bedside. He reports severe throat/mouth pain x1 month. During this time he was unable to eat any solid food - liquids he states he would vomit upon ingestion. Had ginger ale this morning, complained of some nausea, but no vomiting yet. Reports a 30# wt loss over 1 month. Per chart review he exhibits a 23#21% severe wt loss over 10 months, with at least 13# in the last month. Currently on CLD MBS requested, but no GI coverage today. Exhibits some sort of rash on lower extremities.  Nutrition-Focused physical exam completed. Findings are moderate fat depletion, moderate muscle depletion, and no edema.   Labs and medications reviewed: NS w/ KCL @ 144mL/hr  Diet Order:  Diet clear liquid Room service appropriate? Yes; Fluid consistency: Thin  Skin:  Reviewed, no issues  Last BM:  12/28  Height:   Ht Readings from Last 1 Encounters:  06/15/16 5\' 8"  (1.727 m)    Weight:   Wt Readings from Last 1  Encounters:  06/15/16 123 lb 3 oz (55.9 kg)    Ideal Body Weight:  70 kg  BMI:  Body mass index is 18.73 kg/m.  Estimated Nutritional Needs:   Kcal:  1550-1850 calories (MSJ x1.1-1.3)  Protein:  67-83 gm  Fluid:  >/= 1.5L  EDUCATION NEEDS:   No education needs identified at this time  Satira Anis. Amna Welker, MS, RD LDN Inpatient Clinical Dietitian Pager [phone]

## 2016-06-16 NOTE — Care Management Obs Status (Signed)
MEDICARE OBSERVATION STATUS NOTIFICATION   Patient Details  Name: Kevin Alexander MRN: [id number] Date of Birth: 1965-08-16   Medicare Observation Status Notification Given:  Yes    Jolly Mango, RN 06/16/2016, 9:49 AM

## 2016-06-16 NOTE — Care Management (Addendum)
Alert, oriented and ambulatory. Lives at home with family. 30 lbs weight loss in last 1 o nth.  PCP is Dr. Elijio Miles. Met with patient at bedside and he denies needs. Case Closed

## 2016-06-16 NOTE — Plan of Care (Signed)
Problem: Nutrition: Goal: Adequate nutrition will be maintained Outcome: Not Applicable Date Met: 01/48/40 NPO

## 2016-06-16 NOTE — Progress Notes (Addendum)
[facility] at [place] NAME: Kevin Alexander    MR#:  [id number]  DATE OF BIRTH:  1965-10-03  SUBJECTIVE:   Came in with intractable nausea vomiting when he went for his barium study. REVIEW OF SYSTEMS:   Review of Systems  Constitutional: Positive for weight loss. Negative for chills and fever.  HENT: Negative for ear discharge, ear pain and nosebleeds.   Eyes: Negative for blurred vision, pain and discharge.  Respiratory: Negative for sputum production, shortness of breath, wheezing and stridor.   Cardiovascular: Negative for chest pain, palpitations, orthopnea and PND.  Gastrointestinal: Positive for nausea and vomiting. Negative for abdominal pain and diarrhea.  Genitourinary: Negative for frequency and urgency.  Musculoskeletal: Negative for back pain and joint pain.  Neurological: Negative for sensory change, speech change, focal weakness and weakness.  Psychiatric/Behavioral: Negative for depression and hallucinations. The patient is not nervous/anxious.    Tolerating Diet: Liquid Tolerating PT: Not needed  DRUG ALLERGIES:   Allergies  Allergen Reactions  . Amlodipine Itching  . Protonix [Pantoprazole Sodium]     VITALS:  Blood pressure (!) 142/88, pulse 62, temperature 97.4 F (36.3 C), temperature source Oral, resp. rate 20, height 5\' 8"  (1.727 m), weight 55.9 kg (123 lb 3 oz), SpO2 100 %.  PHYSICAL EXAMINATION:   Physical Exam  GENERAL:  50 y.o.-year-old patient lying in the bed with no acute distress.  EYES: Pupils equal, round, reactive to light and accommodation. No scleral icterus. Extraocular muscles intact.  HEENT: Head atraumatic, normocephalic. Oropharynx and nasopharynx clear. Cannot visualize any oral candidiasis NECK:  Supple, no jugular venous distention. No thyroid enlargement, no tenderness.  LUNGS: Normal breath sounds bilaterally, no wheezing, rales, rhonchi. No use of accessory muscles of  respiration.  CARDIOVASCULAR: S1, S2 normal. No murmurs, rubs, or gallops.  ABDOMEN: Soft, nontender, nondistended. Bowel sounds present. No organomegaly or mass.  EXTREMITIES: No cyanosis, clubbing or edema b/l.    NEUROLOGIC: Cranial nerves II through XII are intact. No focal Motor or sensory deficits b/l.   PSYCHIATRIC:  patient is alert and oriented x 3.  SKIN: No obvious rash, lesion, or ulcer.   LABORATORY PANEL:  CBC  Recent Labs Lab 06/15/16 1237  WBC 7.6  HGB 12.8*  HCT 40.0  PLT 285    Chemistries   Recent Labs Lab 06/15/16 1622 06/16/16 0532  NA  --  139  K  --  4.9  CL  --  110  CO2  --  25  GLUCOSE  --  84  BUN  --  <5*  CREATININE  --  0.64  CALCIUM  --  7.9*  MG 2.2  --    Cardiac Enzymes No results for input(s): TROPONINI in the last 168 hours. RADIOLOGY:  No results found. ASSESSMENT AND PLAN:  Kevin Alexander  is a 50 y.o. male with a known history of Esophagitis Candidasis, PUD, hypertension, asthma and GERD. The patient has had nausea and vomiting for 1 month, which has been worsening for a couple days. Dr. Tiffany Kocher did colonoscopy 2 weeks ago, which showed diffuse esophageal candidiasis. The patient has been treated with Magic mouthwash and nystatin without improvement.  1. Intractable nausea and vomiting, possible due to esophagitis Candidiasis. Nothing by mouth except medication and water, IV fluid support, Zofran when necessary and GI consult.  -Patient did get 3 days of treatment with by mouth fluconazole. I will go ahead and treat him for 5 more days. -Clear  liquid diet advance as tolerated  2. Hypokalemia. Give potassium supplement IV and by mouth, follow-up potassium and magnesium level.  3. Rash. Unclear etiology. Benadryl when necessary.  4. Hypertension. Continue home hypertension medication.  5. Tobacco abuse. Smoking cessation was counseled for 3 minutes, nicotine patch.  Case discussed with Care Management/Social  Worker. Management plans discussed with the patient, family and they are in agreement.  CODE STATUS: Full DVT Prophylaxis: Lovenox TOTAL TIME TAKING CARE OF THIS PATIENT: 30 minutes.  >50% time spent on counselling and coordination of care  POSSIBLE D/C IN one DAYS, DEPENDING ON CLINICAL CONDITION.  Note: This dictation was prepared with Dragon dictation along with smaller phrase technology. Any transcriptional errors that result from this process are unintentional.  Ronnie Mallette M.D on 06/16/2016 at 2:37 PM  Between 7am to 6pm - Pager - [phone]  After 6pm go to [email or web address] - password [email or web address] [facility]  Office  [phone]  CC: Primary care physician; Volanda Napoleon, MD

## 2016-06-17 MED ORDER — ONDANSETRON HCL 4 MG/2ML IJ SOLN
4.0000 mg | Freq: Once | INTRAMUSCULAR | Status: AC
  Administered 2016-06-17: 11:00:00 4 mg via INTRAVENOUS
  Filled 2016-06-17: qty 2

## 2016-06-17 MED ORDER — ONDANSETRON HCL 4 MG PO TABS: 4.0000 mg | ORAL_TABLET | Freq: Three times a day (TID) | ORAL | 0 refills | Status: DC | PRN

## 2016-06-17 MED ORDER — FLUCONAZOLE 100 MG PO TABS: 100.0000 mg | ORAL_TABLET | Freq: Every day | ORAL | 0 refills | Status: DC

## 2016-06-17 NOTE — Progress Notes (Signed)
Kevin Alexander wants to keep pt one more day

## 2016-06-17 NOTE — Discharge Instructions (Signed)
Liquid/soft diet for few days
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
## 2016-06-17 NOTE — Progress Notes (Signed)
[place] at [place] NAME: Kevin Alexander    MR#:  [id number]  DATE OF BIRTH:  [birth year]  SUBJECTIVE:   Came in with intractable nausea vomiting when he went for his barium study. Tolerating CLD REVIEW OF SYSTEMS:   Review of Systems  Constitutional: Positive for weight loss. Negative for chills and fever.  HENT: Negative for ear discharge, ear pain and nosebleeds.   Eyes: Negative for blurred vision, pain and discharge.  Respiratory: Negative for sputum production, shortness of breath, wheezing and stridor.   Cardiovascular: Negative for chest pain, palpitations, orthopnea and PND.  Gastrointestinal: Positive for nausea and vomiting. Negative for abdominal pain and diarrhea.  Genitourinary: Negative for frequency and urgency.  Musculoskeletal: Negative for back pain and joint pain.  Neurological: Negative for sensory change, speech change, focal weakness and weakness.  Psychiatric/Behavioral: Negative for depression and hallucinations. The patient is not nervous/anxious.    Tolerating Diet: Liquid Tolerating PT: Not needed  DRUG ALLERGIES:   Allergies  Allergen Reactions  . Amlodipine Itching  . Protonix [Pantoprazole Sodium]     VITALS:  Blood pressure 125/74, pulse (!) 58, temperature 97.8 F (36.6 C), temperature source Oral, resp. rate 16, height 5\' 8"  (1.727 m), weight 55.9 kg (123 lb 3 oz), SpO2 100 %.  PHYSICAL EXAMINATION:   Physical Exam  GENERAL:  50 y.o.-year-old patient lying in the bed with no acute distress.  EYES: Pupils equal, round, reactive to light and accommodation. No scleral icterus. Extraocular muscles intact.  HEENT: Head atraumatic, normocephalic. Oropharynx and nasopharynx clear. Cannot visualize any oral candidiasis NECK:  Supple, no jugular venous distention. No thyroid enlargement, no tenderness.  LUNGS: Normal breath sounds bilaterally, no wheezing, rales, rhonchi. No use of accessory  muscles of respiration.  CARDIOVASCULAR: S1, S2 normal. No murmurs, rubs, or gallops.  ABDOMEN: Soft, nontender, nondistended. Bowel sounds present. No organomegaly or mass.  EXTREMITIES: No cyanosis, clubbing or edema b/l.    NEUROLOGIC: Cranial nerves II through XII are intact. No focal Motor or sensory deficits b/l.   PSYCHIATRIC:  patient is alert and oriented x 3.  SKIN: No obvious rash, lesion, or ulcer.   LABORATORY PANEL:  CBC  Recent Labs Lab 06/15/16 1237  WBC 7.6  HGB 12.8*  HCT 40.0  PLT 285    Chemistries   Recent Labs Lab 06/15/16 1622 06/16/16 0532  NA  --  139  K  --  4.9  CL  --  110  CO2  --  25  GLUCOSE  --  84  BUN  --  <5*  CREATININE  --  0.64  CALCIUM  --  7.9*  MG 2.2  --    Cardiac Enzymes No results for input(s): TROPONINI in the last 168 hours. RADIOLOGY:  No results found. ASSESSMENT AND PLAN:  Dimitri Schuur  is a 50 y.o. male with a known history of Esophagitis Candidasis, PUD, hypertension, asthma and GERD. The patient has had nausea and vomiting for 1 month, which has been worsening for a couple days. Dr. Tiffany Kocher did colonoscopy 2 weeks ago, which showed diffuse esophageal candidiasis. The patient has been treated with Magic mouthwash and nystatin without improvement.  1. Intractable nausea and vomiting, possible due to esophagitis Candidiasis. Nothing by mouth except medication and water, IV fluid support, Zofran when necessary and GI consult.  -Patient did get 3 days of treatment with by mouth fluconazole. I will go ahead and treat him for 5 more  days. -Clear liquid diet advance as tolerated  2. Hypokalemia. Give potassium supplement IV and by mouth, follow-up potassium and magnesium level.  3. Rash. Unclear etiology. Benadryl when necessary.  4. Hypertension. Continue home hypertension medication.  5. Tobacco abuse. Smoking cessation was counseled for 3 minutes, nicotine patch.  Case discussed with Care Management/Social  Worker. Management plans discussed with the patient, family and they are in agreement.  CODE STATUS: Full DVT Prophylaxis: Lovenox TOTAL TIME TAKING CARE OF THIS PATIENT: 30 minutes.  >50% time spent on counselling and coordination of care  POSSIBLE D/C IN one DAYS, DEPENDING ON CLINICAL CONDITION.  Note: This dictation was prepared with Dragon dictation along with smaller phrase technology. Any transcriptional errors that result from this process are unintentional.  Korri Ask M.D on 06/17/2016 at 12:44 PM  Between 7am to 6pm - Pager - [phone]  After 6pm go to [email or web address] - password [email or web address] [facility]  Office  [phone]  CC: Primary care physician; Volanda Napoleon, MD

## 2016-06-18 LAB — BASIC METABOLIC PANEL
Anion gap: 4 — ABNORMAL LOW (ref 5–15)
BUN: <5 mg/dL — ABNORMAL LOW (ref 6–20)
CO2: 31 mmol/L (ref 22–32)
Calcium: 8.5 mg/dL — ABNORMAL LOW (ref 8.9–10.3)
Chloride: 100 mmol/L — ABNORMAL LOW (ref 101–111)
Creatinine, Ser: 0.75 mg/dL (ref 0.61–1.24)
GFR calc Af Amer: >60 mL/min (ref >60)
GFR calc non Af Amer: >60 mL/min (ref >60)
Glucose, Bld: 100 mg/dL — ABNORMAL HIGH (ref 65–99)
Potassium: 4.7 mmol/L (ref 3.5–5.1)
Sodium: 135 mmol/L (ref 135–145)

## 2016-06-18 MED ORDER — METOCLOPRAMIDE HCL 5 MG/ML IJ SOLN
5.0000 mg | Freq: Three times a day (TID) | INTRAMUSCULAR | Status: DC
  Administered 2016-06-18 – 2016-06-19 (×5): 5 mg via INTRAVENOUS
  Filled 2016-06-18 (×5): qty 2

## 2016-06-18 MED ORDER — POLYETHYLENE GLYCOL 3350 17 G PO PACK
17.0000 g | PACK | Freq: Every day | ORAL | Status: DC | PRN
  Administered 2016-06-18: 09:00:00 17 g via ORAL
  Filled 2016-06-18: qty 1

## 2016-06-18 MED ORDER — SODIUM CHLORIDE 0.9% FLUSH
3.0000 mL | Freq: Two times a day (BID) | INTRAVENOUS | Status: DC
  Administered 2016-06-18 – 2016-06-19 (×3): 3 mL via INTRAVENOUS

## 2016-06-18 MED ORDER — SODIUM CHLORIDE 0.9% FLUSH
3.0000 mL | INTRAVENOUS | Status: DC | PRN
  Administered 2016-06-18: 12:00:00 3 mL via INTRAVENOUS
  Filled 2016-06-18: qty 3

## 2016-06-18 MED ORDER — FLUCONAZOLE IN SODIUM CHLORIDE 200-0.9 MG/100ML-% IV SOLN
200.0000 mg | INTRAVENOUS | Status: DC
  Administered 2016-06-18 – 2016-06-19 (×2): 200 mg via INTRAVENOUS
  Filled 2016-06-18 (×3): qty 100

## 2016-06-18 MED ORDER — SODIUM CHLORIDE 0.9 % IV SOLN
INTRAVENOUS | Status: DC
  Administered 2016-06-18 – 2016-06-19 (×3): via INTRAVENOUS

## 2016-06-18 MED ORDER — TRIAMCINOLONE ACETONIDE 0.1 % EX CREA
TOPICAL_CREAM | Freq: Two times a day (BID) | CUTANEOUS | Status: AC
  Administered 2016-06-18: 21:00:00 via TOPICAL
  Administered 2016-06-18: 1 via TOPICAL
  Administered 2016-06-19: 11:00:00 via TOPICAL
  Filled 2016-06-18 (×2): qty 15

## 2016-06-18 MED ORDER — SENNOSIDES-DOCUSATE SODIUM 8.6-50 MG PO TABS
2.0000 | ORAL_TABLET | Freq: Every day | ORAL | Status: DC
  Administered 2016-06-18: 09:00:00 2 via ORAL
  Filled 2016-06-18: qty 2

## 2016-06-18 NOTE — Progress Notes (Signed)
Gastroenterology Progress Note    Kevin Alexander 50 y.o. October 03, 1965   Subjective: Having epigastric pain at times with swallowing broth. Tolerated more liquids. Occasional nausea. Denies vomiting.  Objective: Vital signs in last 24 hours: Vitals:   06/18/16 0559 06/18/16 1233  BP: (!) 153/79 132/81  Pulse: (!) 51 (!) 56  Resp: 18 20  Temp: 97.9 F (36.6 C) 97.6 F (36.4 C)    Physical Exam: Gen: lethargic, no acute distress CV: RRR Chest: CTA B Abd: epigastric tenderness with guarding, soft, nondistended, +BS Ext: no edema  Lab Results:  Recent Labs  06/15/16 1622 06/16/16 0532 06/18/16 0740  NA  --  139 135  K  --  4.9 4.7  CL  --  110 100*  CO2  --  25 31  GLUCOSE  --  84 100*  BUN  --  <5* <5*  CREATININE  --  0.64 0.75  CALCIUM  --  7.9* 8.5*  MG 2.2  --   --   PHOS 3.5  --   --    No results for input(s): AST, ALT, ALKPHOS, BILITOT, PROT, ALBUMIN in the last 72 hours. No results for input(s): WBC, NEUTROABS, HGB, HCT, MCV, PLT in the last 72 hours. No results for input(s): LABPROT, INR in the last 72 hours.    Assessment/Plan: Candida esophagitis - will increase dose of Diflucan and change to IV at 200 mg IV Q 24 hours and see if that improves his swallowing. Full liquids ok. Hopefully, he will improve further during next 24 hours and may be able to go home tomorrow if so.   [place] C. 06/18/2016, 1:41 PM

## 2016-06-18 NOTE — Progress Notes (Signed)
Diet advanced to full liquids with pt tolerating sherbert, puddings with mild nausea at intervals; no vomiting. Reports pain less; controlled presenetly.  Reports can tell reglan is helping. Reports adequate BM after laxatives. Up in room.

## 2016-06-18 NOTE — Progress Notes (Signed)
[place] at [place] NAME: Kevin Alexander    MR#:  [id number]  DATE OF BIRTH:  November 29, 1965  SUBJECTIVE:  CHIEF COMPLAINT:   Chief Complaint  Patient presents with  . Emesis   - still feels very dehydrated and nauseous, still has significant odynophagia  REVIEW OF SYSTEMS:  Review of Systems  Constitutional: Positive for malaise/fatigue. Negative for chills and fever.  HENT: Positive for sore throat. Negative for ear pain, hearing loss and tinnitus.   Eyes: Negative for blurred vision and double vision.  Respiratory: Negative for cough, shortness of breath and wheezing.   Cardiovascular: Negative for chest pain, palpitations and leg swelling.  Gastrointestinal: Positive for nausea. Negative for abdominal pain, constipation, diarrhea and vomiting.  Genitourinary: Negative for dysuria and urgency.  Neurological: Negative for dizziness, sensory change, speech change, focal weakness, seizures and headaches.  Psychiatric/Behavioral: Negative for depression.    DRUG ALLERGIES:   Allergies  Allergen Reactions  . Amlodipine Itching  . Protonix [Pantoprazole Sodium]     VITALS:  Blood pressure (!) 153/79, pulse (!) 51, temperature 97.9 F (36.6 C), temperature source Oral, resp. rate 18, height 5\' 8"  (1.727 m), weight 55.9 kg (123 lb 3 oz), SpO2 100 %.  PHYSICAL EXAMINATION:  Physical Exam  GENERAL:  50 y.o.-year-old patient lying in the bed with no acute distress.  EYES: Pupils equal, round, reactive to light and accommodation. No scleral icterus. Extraocular muscles intact.  HEENT: Head atraumatic, normocephalic. Oropharynx and nasopharynx clear. No oral thrush noted. NECK:  Supple, no jugular venous distention. No thyroid enlargement, no tenderness.  LUNGS: Normal breath sounds bilaterally, no wheezing, rales,rhonchi or crepitation. No use of accessory muscles of respiration.  CARDIOVASCULAR: S1, S2 normal. No murmurs, rubs, or  gallops.  ABDOMEN: Soft, nontender, nondistended. Bowel sounds present. No organomegaly or mass.  EXTREMITIES: No pedal edema, cyanosis, or clubbing.  NEUROLOGIC: Cranial nerves II through XII are intact. Muscle strength 5/5 in all extremities. Sensation intact. Gait not checked.  PSYCHIATRIC: The patient is alert and oriented x 3.  SKIN: No obvious rash, lesion, or ulcer.    LABORATORY PANEL:   CBC  Recent Labs Lab 06/15/16 1237  WBC 7.6  HGB 12.8*  HCT 40.0  PLT 285   ------------------------------------------------------------------------------------------------------------------  Chemistries   Recent Labs Lab 06/15/16 1622  06/18/16 0740  NA  --   < > 135  K  --   < > 4.7  CL  --   < > 100*  CO2  --   < > 31  GLUCOSE  --   < > 100*  BUN  --   < > <5*  CREATININE  --   < > 0.75  CALCIUM  --   < > 8.5*  MG 2.2  --   --   < > = values in this interval not displayed. ------------------------------------------------------------------------------------------------------------------  Cardiac Enzymes No results for input(s): TROPONINI in the last 168 hours. ------------------------------------------------------------------------------------------------------------------  RADIOLOGY:  No results found.  EKG:   Orders placed or performed during the hospital encounter of 06/15/16  . ED EKG  . ED EKG    ASSESSMENT AND PLAN:   [name] a 50 y.o.malewith a known history of Esophagitis Candidasis, PUD, hypertension, asthma and GERD. The patient has had nausea and vomiting for 1 month, which has been worsening for a couple days. Dr. Tiffany Kocher did EGD 2 weeks ago, which showed diffuse esophageal candidiasis.   1. Intractable nausea and vomiting,  due to esophagitis Candidiasis. - Still has significant odynophagia and nausea. Start Reglan prior to meals, continue Diflucan. -Continue IV fluids. Slowly advance diet to full liquids  2. Hypokalemia. Improved after  replacement  3. Rash. Unclear etiology. Benadryl when necessary. Likely has underlying dry skin and psoriasis. Outpatient dermatology follow-up recommended. -Triamcinolone ointment started  4. Hypertension. Continue home hypertension medication. On Coreg and lisinopril  5. Tobacco abuse. Smoking cessation was counseled for 3 minutes, nicotine patch.  6. Depression-on Lexapro  7. DVT prophylaxis-on Lovenox     All the records are reviewed and case discussed with Care Management/Social Workerr. Management plans discussed with the patient, family and they are in agreement.  CODE STATUS: Full Code  TOTAL TIME TAKING CARE OF THIS PATIENT: 37 minutes.   POSSIBLE D/C TOMORROW, DEPENDING ON CLINICAL CONDITION.   Gladstone Lighter M.D on 06/18/2016 at 8:24 AM  Between 7am to 6pm - Pager - [phone]  After 6pm go to [email or web address] - password [email or web address]  Office  [phone]  CC: Primary care physician; Volanda Napoleon, MD

## 2016-06-19 DIAGNOSIS — F329 Major depressive disorder, single episode, unspecified: Secondary | ICD-10-CM | POA: Diagnosis not present

## 2016-06-19 DIAGNOSIS — R1110 Vomiting, unspecified: Secondary | ICD-10-CM | POA: Diagnosis present

## 2016-06-19 DIAGNOSIS — I252 Old myocardial infarction: Secondary | ICD-10-CM | POA: Diagnosis not present

## 2016-06-19 DIAGNOSIS — F17210 Nicotine dependence, cigarettes, uncomplicated: Secondary | ICD-10-CM | POA: Diagnosis not present

## 2016-06-19 DIAGNOSIS — R21 Rash and other nonspecific skin eruption: Secondary | ICD-10-CM | POA: Diagnosis not present

## 2016-06-19 DIAGNOSIS — K210 Gastro-esophageal reflux disease with esophagitis: Secondary | ICD-10-CM | POA: Diagnosis not present

## 2016-06-19 DIAGNOSIS — I10 Essential (primary) hypertension: Secondary | ICD-10-CM | POA: Diagnosis not present

## 2016-06-19 DIAGNOSIS — Z8711 Personal history of peptic ulcer disease: Secondary | ICD-10-CM | POA: Diagnosis not present

## 2016-06-19 DIAGNOSIS — Z7982 Long term (current) use of aspirin: Secondary | ICD-10-CM | POA: Diagnosis not present

## 2016-06-19 DIAGNOSIS — Z96641 Presence of right artificial hip joint: Secondary | ICD-10-CM | POA: Diagnosis not present

## 2016-06-19 DIAGNOSIS — Z79899 Other long term (current) drug therapy: Secondary | ICD-10-CM | POA: Diagnosis not present

## 2016-06-19 DIAGNOSIS — E876 Hypokalemia: Secondary | ICD-10-CM | POA: Diagnosis not present

## 2016-06-19 DIAGNOSIS — R112 Nausea with vomiting, unspecified: Secondary | ICD-10-CM | POA: Diagnosis not present

## 2016-06-19 DIAGNOSIS — B3781 Candidal esophagitis: Secondary | ICD-10-CM | POA: Diagnosis not present

## 2016-06-19 MED ORDER — FLUCONAZOLE 200 MG PO TABS: 200.0000 mg | ORAL_TABLET | Freq: Every day | ORAL | 0 refills | Status: DC

## 2016-06-19 MED ORDER — METOCLOPRAMIDE HCL 10 MG PO TABS: 10.0000 mg | ORAL_TABLET | Freq: Three times a day (TID) | ORAL | 0 refills | Status: DC

## 2016-06-19 MED ORDER — TRAMADOL HCL 50 MG PO TABS: 50.0000 mg | ORAL_TABLET | Freq: Four times a day (QID) | ORAL | 0 refills | Status: DC | PRN
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
## 2016-06-19 NOTE — Progress Notes (Signed)
No nausea or vomiting noted this shift. No acute distress noted.  Vicodin 5-325 mg I tab administered at  21:27 for c/o stomach pain of 5/10 on the pain scale.  Patient is resting at this time  with her eyes closed, respirations even and unlabored. Will continue to monitor.

## 2016-06-19 NOTE — Progress Notes (Signed)
Gastroenterology Progress Note    Kevin Alexander 51 y.o. 1966-03-28   Subjective: Tolerating full liquids better with intermittent nausea without vomiting.  Objective: Vital signs in last 24 hours: Vitals:   06/19/16 0540 06/19/16 0927  BP: (!) 157/72 132/73  Pulse: (!) 51 (!) 55  Resp:    Temp:      Physical Exam: Gen: alert, no acute distress CV: RRR Chest: CTA B Abd: epigastric tenderness with guarding, soft, nondistended, +BS Ext: no edema  Lab Results:  Recent Labs  06/18/16 0740  NA 135  K 4.7  CL 100*  CO2 31  GLUCOSE 100*  BUN <5*  CREATININE 0.75  CALCIUM 8.5*   No results for input(s): AST, ALT, ALKPHOS, BILITOT, PROT, ALBUMIN in the last 72 hours. No results for input(s): WBC, NEUTROABS, HGB, HCT, MCV, PLT in the last 72 hours. No results for input(s): LABPROT, INR in the last 72 hours.    Assessment/Plan: Candida esophagitis - doing better on Diflucan 200 mg IV; tolerating full liquids better. Ok to go home on full liquid diet with slow advancement as tolerated. Change to Diflucan 200 mg PO QD to complete a 10 day course. Have pt f/u with Dr. Vira Agar in 4 weeks.   [place] C. 06/19/2016, 10:38 AM

## 2016-06-19 NOTE — Progress Notes (Signed)
Pt for discharge home. Alert. No resp distress. No  C/o n/v at this time.discharge instructions discussed with pt. presc given and discussed and  Home meds discussed. Diet / activity and f/u discussed. Pt to call dr elliotts office tomorrow  To see  Him on f/u.  Home with wife at this time  Via w/c w/o c/o.

## 2016-07-04 ENCOUNTER — Encounter: Payer: Self-pay | Admitting: Emergency Medicine

## 2016-07-04 ENCOUNTER — Emergency Department
Admission: EM | Admit: 2016-07-04 | Discharge: 2016-07-04 | Disposition: A | Discharge status: Home / Self Care | Payer: Medicare Other | Attending: Emergency Medicine | Admitting: Emergency Medicine

## 2016-07-04 DIAGNOSIS — F17210 Nicotine dependence, cigarettes, uncomplicated: Secondary | ICD-10-CM | POA: Insufficient documentation

## 2016-07-04 DIAGNOSIS — R112 Nausea with vomiting, unspecified: Secondary | ICD-10-CM | POA: Insufficient documentation

## 2016-07-04 DIAGNOSIS — I10 Essential (primary) hypertension: Secondary | ICD-10-CM | POA: Insufficient documentation

## 2016-07-04 DIAGNOSIS — I252 Old myocardial infarction: Secondary | ICD-10-CM | POA: Insufficient documentation

## 2016-07-04 DIAGNOSIS — J45909 Unspecified asthma, uncomplicated: Secondary | ICD-10-CM | POA: Diagnosis not present

## 2016-07-04 LAB — COMPREHENSIVE METABOLIC PANEL
ALT: 8 U/L — ABNORMAL LOW (ref 17–63)
AST: 22 U/L (ref 15–41)
Albumin: 3.1 g/dL — ABNORMAL LOW (ref 3.5–5.0)
Alkaline Phosphatase: 119 U/L (ref 38–126)
Anion gap: 10 (ref 5–15)
BUN: 7 mg/dL (ref 6–20)
CO2: 27 mmol/L (ref 22–32)
Calcium: 8.7 mg/dL — ABNORMAL LOW (ref 8.9–10.3)
Chloride: 100 mmol/L — ABNORMAL LOW (ref 101–111)
Creatinine, Ser: 0.81 mg/dL (ref 0.61–1.24)
GFR calc Af Amer: >60 mL/min (ref >60)
GFR calc non Af Amer: >60 mL/min (ref >60)
Glucose, Bld: 110 mg/dL — ABNORMAL HIGH (ref 65–99)
Potassium: 2.7 mmol/L — CL (ref 3.5–5.1)
Sodium: 137 mmol/L (ref 135–145)
Total Bilirubin: 0.6 mg/dL (ref 0.3–1.2)
Total Protein: 6.8 g/dL (ref 6.5–8.1)

## 2016-07-04 LAB — CBC
HCT: 32.7 % — ABNORMAL LOW (ref 40.0–52.0)
Hemoglobin: 10.8 g/dL — ABNORMAL LOW (ref 13.0–18.0)
MCH: 27.8 pg (ref 26.0–34.0)
MCHC: 32.9 g/dL (ref 32.0–36.0)
MCV: 84.6 fL (ref 80.0–100.0)
Platelets: 386 10*3/uL (ref 150–440)
RBC: 3.87 MIL/uL — ABNORMAL LOW (ref 4.40–5.90)
RDW: 16.4 % — ABNORMAL HIGH (ref 11.5–14.5)
WBC: 8.2 10*3/uL (ref 3.8–10.6)

## 2016-07-04 LAB — LIPASE, BLOOD: Lipase: 38 U/L (ref 11–51)

## 2016-07-04 MED ORDER — POTASSIUM CHLORIDE CRYS ER 20 MEQ PO TBCR
EXTENDED_RELEASE_TABLET | ORAL | Status: AC
  Filled 2016-07-04: qty 1

## 2016-07-04 MED ORDER — METOCLOPRAMIDE HCL 5 MG/ML IJ SOLN
10.0000 mg | Freq: Once | INTRAMUSCULAR | Status: AC
  Administered 2016-07-04: 10 mg via INTRAVENOUS
  Filled 2016-07-04: qty 2

## 2016-07-04 MED ORDER — ONDANSETRON HCL 4 MG PO TABS: 4.0000 mg | ORAL_TABLET | Freq: Three times a day (TID) | ORAL | 0 refills | Status: DC | PRN

## 2016-07-04 MED ORDER — METOCLOPRAMIDE HCL 10 MG PO TABS: 10.0000 mg | ORAL_TABLET | Freq: Three times a day (TID) | ORAL | 0 refills | Status: DC | PRN

## 2016-07-04 MED ORDER — FLUCONAZOLE 200 MG PO TABS
200.0000 mg | ORAL_TABLET | Freq: Every day | ORAL | 0 refills | Status: AC
Start: 2016-07-04 — End: 2016-07-11

## 2016-07-04 MED ORDER — TRAMADOL HCL 50 MG PO TABS: 50.0000 mg | ORAL_TABLET | Freq: Four times a day (QID) | ORAL | 0 refills | Status: DC | PRN

## 2016-07-04 MED ORDER — SODIUM CHLORIDE 0.9 % IV SOLN
Freq: Once | INTRAVENOUS | Status: AC
  Administered 2016-07-04: 15:00:00 via INTRAVENOUS
  Filled 2016-07-04: qty 1000

## 2016-07-04 MED ORDER — POTASSIUM CHLORIDE ER 20 MEQ PO TBCR: 20.0000 meq | EXTENDED_RELEASE_TABLET | Freq: Every day | ORAL | 0 refills | Status: DC

## 2016-07-04 MED ORDER — SODIUM CHLORIDE 0.9 % IV BOLUS (SEPSIS)
500.0000 mL | Freq: Once | INTRAVENOUS | Status: AC
  Administered 2016-07-04: 500 mL via INTRAVENOUS

## 2016-07-04 MED ORDER — POTASSIUM CHLORIDE CRYS ER 20 MEQ PO TBCR
20.0000 meq | EXTENDED_RELEASE_TABLET | Freq: Once | ORAL | Status: AC
  Administered 2016-07-04: 20 meq via ORAL

## 2016-07-04 NOTE — Discharge Instructions (Signed)
Please advance diet as tolerated. Return here to emergency department for bloody emesis, increasing abdominal pain, fever, blood in stool, or any other new concerns. Please contact the gastroenterologist informed them of your visit and repeat prescription of your medications for home. Your potassium was low but I suspect it'll come up with advancement of your diet.

## 2016-07-04 NOTE — ED Triage Notes (Signed)
Pt to ed via ems with reports of n/v. Was recently admitted for four days for n/v. Pt states Dr Vira Agar told him to come to er if no better.
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
## 2016-07-26 ENCOUNTER — Encounter: Admission: RE | Admit: 2016-07-26 | Payer: Medicare Other | Source: Ambulatory Visit

## 2016-08-10 ENCOUNTER — Encounter
Admission: RE | Admit: 2016-08-10 | Discharge: 2016-08-10 | Disposition: A | Discharge status: Home / Self Care | Payer: Medicare Other | Source: Ambulatory Visit | Attending: Unknown Physician Specialty | Admitting: Unknown Physician Specialty

## 2016-08-10 DIAGNOSIS — R112 Nausea with vomiting, unspecified: Secondary | ICD-10-CM | POA: Diagnosis present

## 2016-08-10 MED ORDER — TECHNETIUM TC 99M SULFUR COLLOID
2.0000 | Freq: Once | INTRAVENOUS | Status: AC | PRN
Start: 2016-08-10 — End: 2016-08-10
  Administered 2016-08-10: 2.15 via INTRAVENOUS

## 2016-09-12 ENCOUNTER — Encounter: Payer: Self-pay | Admitting: *Deleted

## 2016-09-13 ENCOUNTER — Ambulatory Visit: Payer: Medicare Other | Admitting: Anesthesiology

## 2016-09-13 ENCOUNTER — Encounter
Admission: RE | Disposition: A | Discharge status: Home / Self Care | Payer: Self-pay | Source: Ambulatory Visit | Attending: Unknown Physician Specialty

## 2016-09-13 ENCOUNTER — Ambulatory Visit
Admission: RE | Admit: 2016-09-13 | Discharge: 2016-09-13 | Disposition: A | Discharge status: Home / Self Care | Payer: Medicare Other | Source: Ambulatory Visit | Attending: Unknown Physician Specialty | Admitting: Unknown Physician Specialty

## 2016-09-13 ENCOUNTER — Encounter: Payer: Self-pay | Admitting: *Deleted

## 2016-09-13 DIAGNOSIS — K2950 Unspecified chronic gastritis without bleeding: Secondary | ICD-10-CM | POA: Diagnosis not present

## 2016-09-13 DIAGNOSIS — Z7982 Long term (current) use of aspirin: Secondary | ICD-10-CM | POA: Insufficient documentation

## 2016-09-13 DIAGNOSIS — I252 Old myocardial infarction: Secondary | ICD-10-CM | POA: Insufficient documentation

## 2016-09-13 DIAGNOSIS — F329 Major depressive disorder, single episode, unspecified: Secondary | ICD-10-CM | POA: Insufficient documentation

## 2016-09-13 DIAGNOSIS — K219 Gastro-esophageal reflux disease without esophagitis: Secondary | ICD-10-CM | POA: Diagnosis not present

## 2016-09-13 DIAGNOSIS — Z96642 Presence of left artificial hip joint: Secondary | ICD-10-CM | POA: Diagnosis not present

## 2016-09-13 DIAGNOSIS — R112 Nausea with vomiting, unspecified: Secondary | ICD-10-CM | POA: Insufficient documentation

## 2016-09-13 DIAGNOSIS — Z79899 Other long term (current) drug therapy: Secondary | ICD-10-CM | POA: Insufficient documentation

## 2016-09-13 DIAGNOSIS — I10 Essential (primary) hypertension: Secondary | ICD-10-CM | POA: Diagnosis not present

## 2016-09-13 DIAGNOSIS — F17210 Nicotine dependence, cigarettes, uncomplicated: Secondary | ICD-10-CM | POA: Insufficient documentation

## 2016-09-13 DIAGNOSIS — B3781 Candidal esophagitis: Secondary | ICD-10-CM | POA: Insufficient documentation

## 2016-09-13 HISTORY — PX: ESOPHAGOGASTRODUODENOSCOPY (EGD) WITH PROPOFOL: SHX5813

## 2016-09-13 SURGERY — ESOPHAGOGASTRODUODENOSCOPY (EGD) WITH PROPOFOL
Anesthesia: General

## 2016-09-13 MED ORDER — MIDAZOLAM HCL 2 MG/2ML IJ SOLN
INTRAMUSCULAR | Status: DC | PRN
  Administered 2016-09-13: 2 mg via INTRAVENOUS

## 2016-09-13 MED ORDER — LIDOCAINE HCL (CARDIAC) 20 MG/ML IV SOLN
INTRAVENOUS | Status: DC | PRN
  Administered 2016-09-13: 50 mg via INTRAVENOUS

## 2016-09-13 MED ORDER — SODIUM CHLORIDE 0.9 % IV SOLN
INTRAVENOUS | Status: DC
Start: 2016-09-13 — End: 2016-09-13
  Administered 2016-09-13: 11:00:00 via INTRAVENOUS

## 2016-09-13 MED ORDER — MIDAZOLAM HCL 2 MG/2ML IJ SOLN
INTRAMUSCULAR | Status: AC
  Filled 2016-09-13: qty 2

## 2016-09-13 MED ORDER — PROPOFOL 500 MG/50ML IV EMUL
INTRAVENOUS | Status: AC
  Filled 2016-09-13: qty 50

## 2016-09-13 MED ORDER — SODIUM CHLORIDE 0.9 % IV SOLN: INTRAVENOUS | Status: DC

## 2016-09-13 MED ORDER — PROPOFOL 10 MG/ML IV BOLUS
INTRAVENOUS | Status: DC | PRN
  Administered 2016-09-13: 30 mg via INTRAVENOUS
  Administered 2016-09-13: 20 mg via INTRAVENOUS

## 2016-09-13 MED ORDER — PROPOFOL 500 MG/50ML IV EMUL
INTRAVENOUS | Status: DC | PRN
  Administered 2016-09-13: 140 ug/kg/min via INTRAVENOUS
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
## 2016-09-13 NOTE — Op Note (Signed)
[facility] Patient Name: Kevin Alexander Procedure Date: 09/13/2016 11:36 AM MRN: [id number] Account #: [id number] Date of Birth: August 28, 1965 Admit Type: Outpatient Age: 51 Room: [facility] ENDO ROOM 4 Gender: Male Note Status: Finalized Procedure:            Upper GI endoscopy Indications:          Nausea with vomiting, Persistent vomiting, Persistent                        vomiting of unknown cause Providers:            Manya Silvas, MD Referring MD:         Venetia Maxon. Elijio Miles, MD (Referring MD) Medicines:            Propofol per Anesthesia Complications:        No immediate complications. Procedure:            Pre-Anesthesia Assessment:                       - After reviewing the risks and benefits, the patient                        was deemed in satisfactory condition to undergo the                        procedure.                       After obtaining informed consent, the endoscope was                        passed under direct vision. Throughout the procedure,                        the patient's blood pressure, pulse, and oxygen                        saturations were monitored continuously. The                        Colonoscope was introduced through the mouth, and                        advanced to the second part of duodenum. The upper GI                        endoscopy was accomplished without difficulty. The                        patient tolerated the procedure well. Findings:      Diffuse candidiasis was found in the upper third of the esophagus and in       the middle third of the esophagus.      Diffuse mildly erythematous mucosa without bleeding was found in the       gastric body and in the gastric antrum. Biopsies were taken with a cold       forceps for histology. Biopsies were taken with a cold forceps for       Helicobacter pylori testing.      The examined duodenum was normal. Impression:           -  Monilial  esophagitis.                       - Erythematous mucosa in the gastric body and antrum.                        Biopsied.                       - Normal examined duodenum. Recommendation:       - Await pathology results. Check labs for possible                        infection. Manya Silvas, MD 09/13/2016 11:50:48 AM This report has been signed electronically. Number of Addenda: 0 Note Initiated On: 09/13/2016 11:36 AM      [facility]

## 2016-09-13 NOTE — Anesthesia Postprocedure Evaluation (Signed)
Anesthesia Post Note  Patient: Kevin Alexander  Procedure(s) Performed: Procedure(s) (LRB): ESOPHAGOGASTRODUODENOSCOPY (EGD) WITH PROPOFOL (N/A)  Patient location during evaluation: PACU Anesthesia Type: General Level of consciousness: awake and alert Pain management: pain level controlled Vital Signs Assessment: post-procedure vital signs reviewed and stable Respiratory status: spontaneous breathing, nonlabored ventilation, respiratory function stable and patient connected to nasal cannula oxygen Cardiovascular status: blood pressure returned to baseline and stable Postop Assessment: no signs of nausea or vomiting Anesthetic complications: no     Last Vitals:  Vitals:   09/13/16 1213 09/13/16 1223  BP: (!) 130/98 132/81  Pulse: 77 71  Resp: 18 14  Temp:      Last Pain:  Vitals:   09/13/16 1156  TempSrc: Axillary  PainSc:                  Molli Barrows

## 2016-09-13 NOTE — Transfer of Care (Signed)
Immediate Anesthesia Transfer of Care Note  Patient: Kevin Alexander  Procedure(s) Performed: Procedure(s): ESOPHAGOGASTRODUODENOSCOPY (EGD) WITH PROPOFOL (N/A)  Patient Location: PACU  Anesthesia Type:General  Level of Consciousness: sedated  Airway & Oxygen Therapy: Patient Spontanous Breathing and Patient connected to nasal cannula oxygen  Post-op Assessment: Report given to RN and Post -op Vital signs reviewed and stable  Post vital signs: Reviewed and stable  Last Vitals:  Vitals:   09/13/16 1153 09/13/16 1156  BP: 105/71 105/71  Pulse: 84 84  Resp: (!) 24 (!) 24  Temp: 36.2 C 36.2 C    Last Pain:  Vitals:   09/13/16 1156  TempSrc: Axillary  PainSc:          Complications: No apparent anesthesia complications

## 2016-09-13 NOTE — Anesthesia Procedure Notes (Signed)
Date/Time: 09/13/2016 11:37 AM Performed by: Johnna Acosta Pre-anesthesia Checklist: Patient identified, Emergency Drugs available, Suction available, Patient being monitored and Timeout performed Patient Re-evaluated:Patient Re-evaluated prior to inductionOxygen Delivery Method: Nasal cannula

## 2016-09-13 NOTE — Anesthesia Post-op Follow-up Note (Cosign Needed)
Anesthesia QCDR form completed.        

## 2016-09-14 ENCOUNTER — Encounter: Payer: Self-pay | Admitting: Unknown Physician Specialty

## 2016-09-14 LAB — SURGICAL PATHOLOGY

## 2016-09-21 ENCOUNTER — Inpatient Hospital Stay
Admission: EM | Admit: 2016-09-21 | Discharge: 2016-09-23 | DRG: 368 | Disposition: A | Discharge status: Home / Self Care | Payer: Medicare Other | Attending: Internal Medicine | Admitting: Internal Medicine

## 2016-09-21 ENCOUNTER — Encounter: Payer: Self-pay | Admitting: Emergency Medicine

## 2016-09-21 DIAGNOSIS — F329 Major depressive disorder, single episode, unspecified: Secondary | ICD-10-CM | POA: Diagnosis present

## 2016-09-21 DIAGNOSIS — Z79899 Other long term (current) drug therapy: Secondary | ICD-10-CM

## 2016-09-21 DIAGNOSIS — B3781 Candidal esophagitis: Principal | ICD-10-CM | POA: Diagnosis present

## 2016-09-21 DIAGNOSIS — E871 Hypo-osmolality and hyponatremia: Secondary | ICD-10-CM | POA: Diagnosis present

## 2016-09-21 DIAGNOSIS — I252 Old myocardial infarction: Secondary | ICD-10-CM

## 2016-09-21 DIAGNOSIS — M19041 Primary osteoarthritis, right hand: Secondary | ICD-10-CM | POA: Diagnosis present

## 2016-09-21 DIAGNOSIS — I959 Hypotension, unspecified: Secondary | ICD-10-CM | POA: Diagnosis present

## 2016-09-21 DIAGNOSIS — K219 Gastro-esophageal reflux disease without esophagitis: Secondary | ICD-10-CM | POA: Diagnosis present

## 2016-09-21 DIAGNOSIS — G629 Polyneuropathy, unspecified: Secondary | ICD-10-CM | POA: Diagnosis present

## 2016-09-21 DIAGNOSIS — Z681 Body mass index (BMI) 19 or less, adult: Secondary | ICD-10-CM | POA: Diagnosis not present

## 2016-09-21 DIAGNOSIS — M1610 Unilateral primary osteoarthritis, unspecified hip: Secondary | ICD-10-CM | POA: Diagnosis present

## 2016-09-21 DIAGNOSIS — R109 Unspecified abdominal pain: Secondary | ICD-10-CM | POA: Diagnosis present

## 2016-09-21 DIAGNOSIS — I10 Essential (primary) hypertension: Secondary | ICD-10-CM | POA: Diagnosis present

## 2016-09-21 DIAGNOSIS — J45909 Unspecified asthma, uncomplicated: Secondary | ICD-10-CM | POA: Diagnosis present

## 2016-09-21 DIAGNOSIS — E43 Unspecified severe protein-calorie malnutrition: Secondary | ICD-10-CM | POA: Diagnosis present

## 2016-09-21 DIAGNOSIS — Z7982 Long term (current) use of aspirin: Secondary | ICD-10-CM

## 2016-09-21 DIAGNOSIS — R197 Diarrhea, unspecified: Secondary | ICD-10-CM | POA: Diagnosis present

## 2016-09-21 DIAGNOSIS — E860 Dehydration: Secondary | ICD-10-CM

## 2016-09-21 DIAGNOSIS — I359 Nonrheumatic aortic valve disorder, unspecified: Secondary | ICD-10-CM | POA: Diagnosis present

## 2016-09-21 DIAGNOSIS — I2510 Atherosclerotic heart disease of native coronary artery without angina pectoris: Secondary | ICD-10-CM | POA: Diagnosis present

## 2016-09-21 DIAGNOSIS — Z888 Allergy status to other drugs, medicaments and biological substances status: Secondary | ICD-10-CM | POA: Diagnosis not present

## 2016-09-21 DIAGNOSIS — M19042 Primary osteoarthritis, left hand: Secondary | ICD-10-CM | POA: Diagnosis present

## 2016-09-21 DIAGNOSIS — E876 Hypokalemia: Secondary | ICD-10-CM | POA: Diagnosis present

## 2016-09-21 DIAGNOSIS — F17210 Nicotine dependence, cigarettes, uncomplicated: Secondary | ICD-10-CM | POA: Diagnosis present

## 2016-09-21 HISTORY — DX: Hypo-osmolality and hyponatremia: E87.1

## 2016-09-21 LAB — BASIC METABOLIC PANEL
Anion gap: 7 (ref 5–15)
Anion gap: 6 (ref 5–15)
BUN: 8 mg/dL (ref 6–20)
BUN: 7 mg/dL (ref 6–20)
CO2: 26 mmol/L (ref 22–32)
CO2: 26 mmol/L (ref 22–32)
Calcium: 8 mg/dL — ABNORMAL LOW (ref 8.9–10.3)
Calcium: 8.1 mg/dL — ABNORMAL LOW (ref 8.9–10.3)
Chloride: 83 mmol/L — ABNORMAL LOW (ref 101–111)
Chloride: 89 mmol/L — ABNORMAL LOW (ref 101–111)
Creatinine, Ser: 0.77 mg/dL (ref 0.61–1.24)
Creatinine, Ser: 0.78 mg/dL (ref 0.61–1.24)
GFR calc Af Amer: >60 mL/min (ref >60)
GFR calc Af Amer: >60 mL/min (ref >60)
GFR calc non Af Amer: >60 mL/min (ref >60)
GFR calc non Af Amer: >60 mL/min (ref >60)
Glucose, Bld: 111 mg/dL — ABNORMAL HIGH (ref 65–99)
Glucose, Bld: 112 mg/dL — ABNORMAL HIGH (ref 65–99)
Potassium: 3.3 mmol/L — ABNORMAL LOW (ref 3.5–5.1)
Potassium: 4.1 mmol/L (ref 3.5–5.1)
Sodium: 116 mmol/L — CL (ref 135–145)
Sodium: 121 mmol/L — ABNORMAL LOW (ref 135–145)

## 2016-09-21 LAB — CBC
HCT: 36.5 % — ABNORMAL LOW (ref 40.0–52.0)
Hemoglobin: 12.6 g/dL — ABNORMAL LOW (ref 13.0–18.0)
MCH: 30.2 pg (ref 26.0–34.0)
MCHC: 34.4 g/dL (ref 32.0–36.0)
MCV: 87.9 fL (ref 80.0–100.0)
Platelets: 309 10*3/uL (ref 150–440)
RBC: 4.16 MIL/uL — ABNORMAL LOW (ref 4.40–5.90)
RDW: 20.4 % — ABNORMAL HIGH (ref 11.5–14.5)
WBC: 4.1 10*3/uL (ref 3.8–10.6)

## 2016-09-21 LAB — COMPREHENSIVE METABOLIC PANEL
ALT: 12 U/L — ABNORMAL LOW (ref 17–63)
AST: 27 U/L (ref 15–41)
Albumin: 3.7 g/dL (ref 3.5–5.0)
Alkaline Phosphatase: 138 U/L — ABNORMAL HIGH (ref 38–126)
Anion gap: 16 — ABNORMAL HIGH (ref 5–15)
BUN: 8 mg/dL (ref 6–20)
CO2: 22 mmol/L (ref 22–32)
Calcium: 8.6 mg/dL — ABNORMAL LOW (ref 8.9–10.3)
Chloride: 77 mmol/L — ABNORMAL LOW (ref 101–111)
Creatinine, Ser: 1.04 mg/dL (ref 0.61–1.24)
GFR calc Af Amer: >60 mL/min (ref >60)
GFR calc non Af Amer: >60 mL/min (ref >60)
Glucose, Bld: 134 mg/dL — ABNORMAL HIGH (ref 65–99)
Potassium: 2.6 mmol/L — CL (ref 3.5–5.1)
Sodium: 115 mmol/L — CL (ref 135–145)
Total Bilirubin: 0.8 mg/dL (ref 0.3–1.2)
Total Protein: 7 g/dL (ref 6.5–8.1)

## 2016-09-21 LAB — MRSA PCR SCREENING: MRSA by PCR: NEGATIVE

## 2016-09-21 LAB — LIPASE, BLOOD: Lipase: 20 U/L (ref 11–51)

## 2016-09-21 MED ORDER — POTASSIUM CHLORIDE 2 MEQ/ML IV SOLN
30.0000 meq | Freq: Once | INTRAVENOUS | Status: AC
  Administered 2016-09-21: 30 meq via INTRAVENOUS
  Filled 2016-09-21: qty 15

## 2016-09-21 MED ORDER — SODIUM CHLORIDE 0.9 % IV SOLN
INTRAVENOUS | Status: DC
  Administered 2016-09-21 – 2016-09-23 (×3): via INTRAVENOUS

## 2016-09-21 MED ORDER — FLUCONAZOLE IN SODIUM CHLORIDE 100-0.9 MG/50ML-% IV SOLN
100.0000 mg | INTRAVENOUS | Status: DC
  Administered 2016-09-21: 100 mg via INTRAVENOUS
  Filled 2016-09-21 (×3): qty 50

## 2016-09-21 MED ORDER — METOCLOPRAMIDE HCL 5 MG/ML IJ SOLN
5.0000 mg | Freq: Three times a day (TID) | INTRAMUSCULAR | Status: DC
  Administered 2016-09-21 – 2016-09-23 (×7): 5 mg via INTRAVENOUS
  Filled 2016-09-21 (×7): qty 2

## 2016-09-21 MED ORDER — ENOXAPARIN SODIUM 40 MG/0.4ML ~~LOC~~ SOLN
40.0000 mg | SUBCUTANEOUS | Status: DC
  Administered 2016-09-21 – 2016-09-23 (×3): 40 mg via SUBCUTANEOUS
  Filled 2016-09-21 (×3): qty 0.4

## 2016-09-21 MED ORDER — SODIUM CHLORIDE 0.9 % IV SOLN
1000.0000 mL | Freq: Once | INTRAVENOUS | Status: AC
  Administered 2016-09-21: 1000 mL via INTRAVENOUS
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
## 2016-09-21 NOTE — ED Triage Notes (Signed)
Pt sent over from [facility] clinic for N/V/D x4 days.  Patient presents lethargic, pale in color, and diaphoretic.  Patient states this has been going on for months and he has been seen here multiple times for the same issue.  In the past they have told him he has thrush in his esophagus and he has completed a gastric emptying study according to his wife.  Patient has lost 8 lbs in the past month.

## 2016-09-21 NOTE — ED Notes (Signed)
Date and time results received: 09/21/16 1442 (use smartphrase ".now" to insert current time)  Test: potassium and sodium Critical Value: K 2.6, NA 115  Name of Provider Notified: Corky Downs

## 2016-09-21 NOTE — ED Notes (Signed)
Pt report received. Pt is being admitted with hyponatremia. Ns is currently infusing and is awaiting admitting dr.

## 2016-09-22 DIAGNOSIS — E43 Unspecified severe protein-calorie malnutrition: Secondary | ICD-10-CM | POA: Insufficient documentation

## 2016-09-22 LAB — BASIC METABOLIC PANEL
Anion gap: 6 (ref 5–15)
Anion gap: 7 (ref 5–15)
BUN: 7 mg/dL (ref 6–20)
BUN: 7 mg/dL (ref 6–20)
CO2: 27 mmol/L (ref 22–32)
CO2: 26 mmol/L (ref 22–32)
Calcium: 8.4 mg/dL — ABNORMAL LOW (ref 8.9–10.3)
Calcium: 8.4 mg/dL — ABNORMAL LOW (ref 8.9–10.3)
Chloride: 91 mmol/L — ABNORMAL LOW (ref 101–111)
Chloride: 92 mmol/L — ABNORMAL LOW (ref 101–111)
Creatinine, Ser: 0.71 mg/dL (ref 0.61–1.24)
Creatinine, Ser: 0.8 mg/dL (ref 0.61–1.24)
GFR calc Af Amer: >60 mL/min (ref >60)
GFR calc Af Amer: >60 mL/min (ref >60)
GFR calc non Af Amer: >60 mL/min (ref >60)
GFR calc non Af Amer: >60 mL/min (ref >60)
Glucose, Bld: 93 mg/dL (ref 65–99)
Glucose, Bld: 111 mg/dL — ABNORMAL HIGH (ref 65–99)
Potassium: 3.8 mmol/L (ref 3.5–5.1)
Potassium: 3.4 mmol/L — ABNORMAL LOW (ref 3.5–5.1)
Sodium: 124 mmol/L — ABNORMAL LOW (ref 135–145)
Sodium: 125 mmol/L — ABNORMAL LOW (ref 135–145)

## 2016-09-22 LAB — URINALYSIS, COMPLETE (UACMP) WITH MICROSCOPIC
Bacteria, UA: NONE SEEN
Bilirubin Urine: NEGATIVE
Glucose, UA: NEGATIVE mg/dL
Hgb urine dipstick: NEGATIVE
Ketones, ur: NEGATIVE mg/dL
Leukocytes, UA: NEGATIVE
Nitrite: NEGATIVE
Protein, ur: NEGATIVE mg/dL
Specific Gravity, Urine: 1.005 (ref 1.005–1.030)
Squamous Epithelial / LPF: NONE SEEN
pH: 7 (ref 5.0–8.0)

## 2016-09-22 LAB — C DIFFICILE QUICK SCREEN W PCR REFLEX
C Diff antigen: NEGATIVE
C Diff interpretation: NOT DETECTED
C Diff toxin: NEGATIVE

## 2016-09-22 LAB — MAGNESIUM: Magnesium: 1.8 mg/dL (ref 1.7–2.4)

## 2016-09-22 MED ORDER — VORICONAZOLE 200 MG PO TABS
200.0000 mg | ORAL_TABLET | Freq: Two times a day (BID) | ORAL | Status: DC
  Administered 2016-09-22 – 2016-09-23 (×2): 200 mg via ORAL
  Filled 2016-09-22 (×4): qty 1

## 2016-09-22 MED ORDER — DIPHENHYDRAMINE HCL 50 MG/ML IJ SOLN
25.0000 mg | Freq: Four times a day (QID) | INTRAMUSCULAR | Status: DC | PRN
  Administered 2016-09-22 – 2016-09-23 (×2): 25 mg via INTRAVENOUS
  Filled 2016-09-22: qty 0.5
  Filled 2016-09-22: qty 1
  Filled 2016-09-22: qty 0.5

## 2016-09-22 MED ORDER — ENSURE ENLIVE PO LIQD
237.0000 mL | Freq: Three times a day (TID) | ORAL | Status: DC
  Administered 2016-09-22 (×2): 237 mL via ORAL

## 2016-09-22 MED ORDER — DIAZEPAM 5 MG PO TABS
5.0000 mg | ORAL_TABLET | Freq: Once | ORAL | Status: AC
Start: 2016-09-22 — End: 2016-09-22
  Administered 2016-09-22: 21:00:00 5 mg via ORAL
  Filled 2016-09-22: qty 1

## 2016-09-22 NOTE — Progress Notes (Signed)
Patient admitted from ED for N/V and diarrhea, and hyponatremia and hypokalemia. KCL 30 meq IVPB started, NS @ 40 ML/hr start ed.  Patient is alert and oriented.  Reviewed plan of care, oriented him to room features, and unit routines.   No c/o pain at this time.

## 2016-09-22 NOTE — Care Management (Signed)
Lives with wife. Receives disability due to back and hip pain issues. No discharge needs identified by members of the care team

## 2016-09-22 NOTE — Progress Notes (Signed)
Initial Nutrition Assessment  DOCUMENTATION CODES:   Severe malnutrition in context of acute illness/injury (likely component of chronic malnutrition as well)  INTERVENTION:  -Recommend diet advancement as tolerated to Soft diet.  -Ensure Enlive po TID, each supplement provides 350 kcal and 20 grams of protein -Recommend smaller, more frequent meals  NUTRITION DIAGNOSIS:   Malnutrition (Severe) related to acute illness (intractable N/V) as evidenced by percent weight loss, energy intake < or equal to 50% for > or equal to 5 days, severe depletion of body fat, severe depletion of muscle mass.  GOAL:   Patient will meet greater than or equal to 90% of their needs  MONITOR:   Diet advancement, Supplement acceptance, Weight trends, Labs, PO intake  REASON FOR ASSESSMENT:   Consult Poor PO  ASSESSMENT:   51 yo male admitted with severe intractable N/V, diarrhea, poor by mouth intake with hypokalemia and hyponatremia secondary to dehydration. Pt with diffused candidal esophagitis by recent EGD 1 week ago. Pt with additional hx of CAD   Pt tolerating some CL today, no vomiting and no diarrhea. Pt reports swallowing improving. Pt reports very little intake for the past 7-10 days and whatever bites/sips he was able to take in he threw up. Pt reports he has been able to tolerate sips of Ensure. Pt reports prior to this eating well.  Pt reports 30 pound wt loss in 4 months (19.4% wt loss which is significant for time frame); 8 pound wt loss in 2 weeks (6% wt loss). Pt reports he has been very week and lethargic. Pt reports he could sleep all day if he could  Labs: sodium 125 (L but improving), potassium 3.4 (L but improving) Meds: reglan, NS at 40 ml/hr, potassium chloride  Nutrition-Focused physical exam completed. Findings are severe fat depletion, severe muscle depletion, and no edema.    Diet Order:  Diet clear liquid Room service appropriate? Yes; Fluid consistency: Thin  Skin:   Reviewed, no issues  Last BM:  09/21/16  Height:   Ht Readings from Last 1 Encounters:  09/21/16 5\' 7"  (1.702 m)    Weight:   Wt Readings from Last 1 Encounters:  09/21/16 125 lb 1.6 oz (56.7 kg)   BMI:  Body mass index is 19.59 kg/m.  Estimated Nutritional Needs:   Kcal:  1700-2000 kcals  Protein:  85-100 g  Fluid:  >/= 2 L  EDUCATION NEEDS:   No education needs identified at this time  [place], [place], [place] [phone] Pager  [phone] Weekend/On-Call Pager

## 2016-09-22 NOTE — Progress Notes (Signed)
Patient c/o being very cold.  Oral temp 36.7 C.  Obtained warm blankets for him.  Room heat on high.

## 2016-09-22 NOTE — Progress Notes (Signed)
[facility] [place] NAME: Kevin Alexander    MR#:  [id number]  DATE OF BIRTH:  [birth year]  SUBJECTIVE:    REVIEW OF SYSTEMS:   ROS Tolerating Diet: Tolerating PT:   DRUG ALLERGIES:   Allergies  Allergen Reactions  . Fluconazole Rash  . Amlodipine Itching  . Protonix [Pantoprazole Sodium]     VITALS:  Blood pressure 116/71, pulse 74, temperature 98 F (36.7 C), temperature source Oral, resp. rate 14, height 5\' 7"  (1.702 m), weight 56.7 kg (125 lb 1.6 oz), SpO2 100 %.  PHYSICAL EXAMINATION:   Physical Exam  GENERAL:  51 y.o.-year-old patient lying in the bed with no acute distress.  EYES: Pupils equal, round, reactive to light and accommodation. No scleral icterus. Extraocular muscles intact.  HEENT: Head atraumatic, normocephalic. Oropharynx and nasopharynx clear.  NECK:  Supple, no jugular venous distention. No thyroid enlargement, no tenderness.  LUNGS: Normal breath sounds bilaterally, no wheezing, rales, rhonchi. No use of accessory muscles of respiration.  CARDIOVASCULAR: S1, S2 normal. No murmurs, rubs, or gallops.  ABDOMEN: Soft, nontender, nondistended. Bowel sounds present. No organomegaly or mass.  EXTREMITIES: No cyanosis, clubbing or edema b/l.    NEUROLOGIC: Cranial nerves II through XII are intact. No focal Motor or sensory deficits b/l.   PSYCHIATRIC:  patient is alert and oriented x 3.  SKIN: No obvious rash, lesion, or ulcer.   LABORATORY PANEL:  CBC  Recent Labs Lab 09/21/16 1347  WBC 4.1  HGB 12.6*  HCT 36.5*  PLT 309    Chemistries   Recent Labs Lab 09/21/16 1347  09/22/16 0915  NA 115*  < > 125*  K 2.6*  < > 3.4*  CL 77*  < > 92*  CO2 22  < > 26  GLUCOSE 134*  < > 111*  BUN 8  < > 7  CREATININE 1.04  < > 0.80  CALCIUM 8.6*  < > 8.4*  MG  --   --  1.8  AST 27  --   --   ALT 12*  --   --   ALKPHOS 138*  --   --   BILITOT 0.8  --   --   < > = values in this interval not  displayed. Cardiac Enzymes No results for input(s): TROPONINI in the last 168 hours. RADIOLOGY:  No results found. ASSESSMENT AND PLAN:   Kevin Alexander  is a 51 y.o. male with a known history of diffuse candidiasis of upper third of esophagus, middle third of esophagus by EGD a week ago comes in with severe intractable nausea, vomiting, unable to swallow, midepigastric abdominal pain not able to keep anything down. Patient noticed to have lethargic, weakness. Found to have sodium of 115, potassium 2.6 on the labs today. Also noted to have watery diarrhea today morning.  #1. Acute hyponatremia secondary to dehydration with poor by mouth intake with nausea, vomiting, odynophagia -IVF -115--116---121--124---125 -start po diet  #2 hypokalemia due to GI losses and poor by mouth intake: Replace the potassium.  #3 diffuse candidal esophagitis by recent EGD done as out pt -voriconazole for 2 weeks  #4. hypotension secondary to dehydration: Hold the Coreg. And also hold the lisinopril.  #5 history of coronary artery disease: -resume aspirin, statins  #6.diarrhea  -stool for C. Difficile negative  #7 Severe protein calorie malnutrition Dietitian to see   Case discussed with Care Management/Social Worker. Management plans discussed with the patient, family and  they are in agreement.  CODE STATUS: full  DVT Prophylaxis: lovenox  TOTAL TIME TAKING CARE OF THIS PATIENT: 30 minutes.  >50% time spent on counselling and coordination of care  POSSIBLE D/C IN 1-2 DAYS, DEPENDING ON CLINICAL CONDITION.  Note: This dictation was prepared with Dragon dictation along with smaller phrase technology. Any transcriptional errors that result from this process are unintentional.  Johney Perotti M.D on 09/22/2016 at 4:26 PM  Between 7am to 6pm - Pager - [phone]  After 6pm go to [email or web address] - password [email or web address]  Office  [phone]  CC: Primary care  physician; Volanda Napoleon, MD

## 2016-09-22 NOTE — Care Management Important Message (Signed)
Important Message  Patient Details  Name: [name] MRN: [id number] Date of Birth: 05-02-1966   Medicare Important Message Given:  Yes  Signed IM notice given  Katrina Stack, RN 09/22/2016, 9:15 AM

## 2016-09-23 LAB — BASIC METABOLIC PANEL
Anion gap: 5 (ref 5–15)
BUN: 5 mg/dL — ABNORMAL LOW (ref 6–20)
CO2: 27 mmol/L (ref 22–32)
Calcium: 7.8 mg/dL — ABNORMAL LOW (ref 8.9–10.3)
Chloride: 99 mmol/L — ABNORMAL LOW (ref 101–111)
Creatinine, Ser: 0.69 mg/dL (ref 0.61–1.24)
GFR calc Af Amer: >60 mL/min (ref >60)
GFR calc non Af Amer: >60 mL/min (ref >60)
Glucose, Bld: 74 mg/dL (ref 65–99)
Potassium: 2.9 mmol/L — ABNORMAL LOW (ref 3.5–5.1)
Sodium: 131 mmol/L — ABNORMAL LOW (ref 135–145)

## 2016-09-23 LAB — POTASSIUM: Potassium: 4.2 mmol/L (ref 3.5–5.1)

## 2016-09-23 MED ORDER — POTASSIUM CHLORIDE CRYS ER 20 MEQ PO TBCR
40.0000 meq | EXTENDED_RELEASE_TABLET | ORAL | Status: AC
  Administered 2016-09-23 (×2): 40 meq via ORAL
  Filled 2016-09-23 (×2): qty 2

## 2016-09-23 MED ORDER — SODIUM CHLORIDE 0.9% FLUSH
3.0000 mL | Freq: Two times a day (BID) | INTRAVENOUS | Status: DC
  Administered 2016-09-23: 3 mL via INTRAVENOUS

## 2016-09-23 MED ORDER — SODIUM CHLORIDE 0.9 % IV SOLN
30.0000 meq | Freq: Once | INTRAVENOUS | Status: AC
  Administered 2016-09-23: 30 meq via INTRAVENOUS
  Filled 2016-09-23: qty 15

## 2016-09-23 MED ORDER — ENSURE ENLIVE PO LIQD: 237.0000 mL | Freq: Three times a day (TID) | ORAL | 12 refills | Status: DC

## 2016-09-23 MED ORDER — VORICONAZOLE 200 MG PO TABS: 200.0000 mg | ORAL_TABLET | Freq: Two times a day (BID) | ORAL | 0 refills | Status: DC

## 2016-09-23 NOTE — Progress Notes (Signed)
Reports feeling better with no diarrhea improved. Tolerating diet well. Refuses ensures stating he thinks it is causing loose stools. Benadryl IV required once for rash itching primarily now on his legs with relief.  K+ 2.7 with po potassium given and IV K+ supplement infusing. Diet education done to eat/drink K+ rich foods such as bananas, orange juice, potatoes. Pt states he hopes he can be discharged home later today.

## 2016-09-23 NOTE — Progress Notes (Signed)
Recheck K+ 4.2 after po and IV supplementation. No diarrhea. Diet tolerated. Pt states he feels ready to go home. TC with Dr. Tressia Miners with discharge order confirmed after K+ recheck result given. Nutritional education done to include K+ rich foods in diet. Oral and written AVS discharge instructions given with stated understanding. Will discharge home to self care.

## 2016-09-23 NOTE — Progress Notes (Signed)
Pt transported in transport chair to private vehicle; discharged home to self care.

## 2016-09-25 ENCOUNTER — Telehealth: Payer: Self-pay | Admitting: Gastroenterology
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
## 2016-09-25 NOTE — Telephone Encounter (Signed)
LVM for patient to call our office for a two week post hospital f/u with Dr. Allen Norris. Looks like he see Dr. Vira Agar.

## 2016-09-26 ENCOUNTER — Telehealth: Payer: Self-pay | Admitting: Gastroenterology

## 2016-09-26 NOTE — Telephone Encounter (Signed)
See staff message please regarding information below.

## 2016-09-26 NOTE — Telephone Encounter (Signed)
This is something that was started by the hospitalist and myself.  Tell the patient he can take Diflucan 100 mg a day by mouth.  The patient should continue the medication for 2 weeks after his symptoms have resolved.

## 2016-09-26 NOTE — Telephone Encounter (Signed)
Patient has seen Dr. Allen Norris in the hospital and has an appt on 10/05/16 and he can't afford voriconazole (VFEND) 200 MG tablet. Please call patient.

## 2016-09-27 ENCOUNTER — Other Ambulatory Visit: Payer: Self-pay

## 2016-09-27 NOTE — Telephone Encounter (Signed)
Pt is allergic to fluconazole. Contacted pt and he confirmed allergy. Please let me know if there is anything else he can take? He has a follow up appt with you on 10/05/16.

## 2016-10-02 ENCOUNTER — Telehealth: Payer: Self-pay | Admitting: Gastroenterology

## 2016-10-02 NOTE — Telephone Encounter (Signed)
Patient left a voice message regarding a different medication. He took the last one and needs for you to call him back today please

## 2016-10-02 NOTE — Telephone Encounter (Signed)
Pt is allergic to fluconazole. Contacted pt and he confirmed allergy. Please let me know if there is anything else he can take? He has a follow up appt with you on 10/05/16.

## 2016-10-02 NOTE — Telephone Encounter (Signed)
If he cant afford the other medication and is allergic to this medication, what did he take with his last infection a few weeks ago?

## 2016-10-03 NOTE — Telephone Encounter (Signed)
Pt went and picked up previous prescription for nystatin to start. Pt has requested to keep appt for 10/05/16.

## 2016-10-05 ENCOUNTER — Encounter: Payer: Self-pay | Admitting: Gastroenterology

## 2016-10-05 ENCOUNTER — Other Ambulatory Visit: Payer: Self-pay

## 2016-10-05 ENCOUNTER — Ambulatory Visit (INDEPENDENT_AMBULATORY_CARE_PROVIDER_SITE_OTHER): Payer: Medicare Other | Admitting: Gastroenterology

## 2016-10-05 VITALS — BP 112/80 | HR 96 | Temp 98.3°F | Ht 67.0 in | Wt 121.0 lb

## 2016-10-05 DIAGNOSIS — B3781 Candidal esophagitis: Secondary | ICD-10-CM | POA: Diagnosis not present

## 2016-10-05 MED ORDER — VORICONAZOLE 200 MG PO TABS: 200.0000 mg | ORAL_TABLET | Freq: Two times a day (BID) | ORAL | 0 refills | Status: DC

## 2016-10-05 NOTE — Progress Notes (Signed)
Primary Care Physician: Volanda Napoleon, MD  Primary Gastroenterologist:  Dr. Lucilla Lame  Chief Complaint  Patient presents with  . Hospitalization Follow-up    HPI: Kevin Alexander is a 51 y.o. male here For recurrent esophageal candidiasis. The patient was followed at the [facility] clinic for this with Dr. Vira Agar. The patient is allergic to Diflucan and was put on Voriconazole. The patient states that he continues to have some nausea and vomiting but is feeling slightly better now that he is on this medication.    Current Outpatient Prescriptions  Medication Sig Dispense Refill  . Cetirizine HCl 10 MG CAPS Take by mouth.    . diazepam (VALIUM) 5 MG tablet Take 5 mg by mouth every 6 (six) hours as needed for anxiety.    Marland Kitchen escitalopram (LEXAPRO) 10 MG tablet Take 10 mg by mouth daily. PM    . feeding supplement, ENSURE ENLIVE, (ENSURE ENLIVE) LIQD Take 237 mLs by mouth 3 (three) times daily between meals. 237 mL 12  . fexofenadine (ALLEGRA) 180 MG tablet Take 180 mg by mouth every morning.  0  . gabapentin (NEURONTIN) 300 MG capsule Take 300 mg by mouth 2 (two) times daily.    Marland Kitchen omeprazole (PRILOSEC) 40 MG capsule Take 40 mg by mouth 2 (two) times daily.   3  . ondansetron (ZOFRAN) 4 MG tablet Take by mouth.    Marland Kitchen rOPINIRole (REQUIP) 0.5 MG tablet Take 0.5 mg by mouth 3 (three) times daily.    Marland Kitchen voriconazole (VFEND) 200 MG tablet Take 1 tablet (200 mg total) by mouth every 12 (twelve) hours. X 2 weeks 28 tablet 0  . aspirin EC 81 MG tablet Take by mouth.    Marland Kitchen atorvastatin (LIPITOR) 40 MG tablet Take by mouth.    . cetirizine (ZYRTEC) 10 MG tablet TAKE 1 TABLET BY MOUTH EVERY DAY    . cyclobenzaprine (FLEXERIL) 10 MG tablet Take by mouth.    Marland Kitchen ibuprofen (ADVIL,MOTRIN) 800 MG tablet Take by mouth.    Marland Kitchen lisinopril (PRINIVIL,ZESTRIL) 10 MG tablet Take by mouth.    . Loratadine 10 MG CAPS Take by mouth.    . metoCLOPramide (REGLAN) 10 MG tablet Take 10 mg by mouth 3 (three)  times daily.    . sertraline (ZOLOFT) 100 MG tablet Take by mouth.    . sertraline (ZOLOFT) 50 MG tablet Take by mouth.     No current facility-administered medications for this visit.     Allergies as of 10/05/2016 - Review Complete 10/05/2016  Allergen Reaction Noted  . Fluconazole Rash 09/22/2016  . Pantoprazole  08/24/2015  . Protonix [pantoprazole sodium]  06/01/2016  . Amlodipine Itching and Rash 05/22/2013  . Amlodipine besylate Hives and Rash 07/30/2013    ROS:  General: Negative for anorexia, weight loss, fever, chills, fatigue, weakness. ENT: Negative for hoarseness, difficulty swallowing , nasal congestion. CV: Negative for chest pain, angina, palpitations, dyspnea on exertion, peripheral edema.  Respiratory: Negative for dyspnea at rest, dyspnea on exertion, cough, sputum, wheezing.  GI: See history of present illness. GU:  Negative for dysuria, hematuria, urinary incontinence, urinary frequency, nocturnal urination.  Endo: Negative for unusual weight change.    Physical Examination:   BP 112/80   Pulse 96   Temp 98.3 F (36.8 C) (Oral)   Ht 5\' 7"  (1.702 m)   Wt 121 lb (54.9 kg)   BMI 18.95 kg/m   General: Well-nourished, well-developed in no acute distress.  Eyes: No icterus. Conjunctivae  pink. Mouth: Oropharyngeal mucosa moist and pink , no lesions erythema or exudate. Lungs: Clear to auscultation bilaterally. Non-labored. Heart: Regular rate and rhythm, no murmurs rubs or gallops.  Abdomen: Bowel sounds are normal, nontender, nondistended, no hepatosplenomegaly or masses, no abdominal bruits or hernia , no rebound or guarding.   Extremities: No lower extremity edema. No clubbing or deformities. Neuro: Alert and oriented x 3.  Grossly intact. Skin: Warm and dry, no jaundice.   Psych: Alert and cooperative, normal mood and affect.  Labs:    Imaging Studies: No results found.  Assessment and Plan:   Kevin Alexander is a 51 y.o. y/o male with  recurrent candidal esophagitis. The patient will be continued on his medication to get rid of the candidal infection. The patient will also be sent to infectious disease for evaluation for the reason for his recurrent infection. The patient has been explained the plan and agrees with it.    Lucilla Lame, MD. Marval Regal   Note: This dictation was prepared with Dragon dictation along with smaller phrase technology. Any transcriptional errors that result from this process are unintentional.

## 2016-10-10 ENCOUNTER — Telehealth: Payer: Self-pay | Admitting: Gastroenterology

## 2016-10-10 NOTE — Telephone Encounter (Signed)
Patient called to let you know that he has not heard from Infections Disease. Please call patient.

## 2016-10-10 NOTE — Telephone Encounter (Signed)
Advised pt referral has been sent to Dr Ola Spurr. He will receive a call shortly to schedule appt.

## 2016-10-17 ENCOUNTER — Encounter (HOSPITAL_COMMUNITY): Payer: Self-pay | Admitting: Emergency Medicine

## 2016-10-17 ENCOUNTER — Emergency Department (HOSPITAL_COMMUNITY)
Admission: EM | Admit: 2016-10-17 | Discharge: 2016-10-17 | Disposition: A | Discharge status: Home / Self Care | Payer: Medicare Other | Attending: Emergency Medicine | Admitting: Emergency Medicine

## 2016-10-17 ENCOUNTER — Emergency Department (HOSPITAL_COMMUNITY): Payer: Medicare Other

## 2016-10-17 DIAGNOSIS — I252 Old myocardial infarction: Secondary | ICD-10-CM | POA: Diagnosis not present

## 2016-10-17 DIAGNOSIS — I10 Essential (primary) hypertension: Secondary | ICD-10-CM | POA: Diagnosis not present

## 2016-10-17 DIAGNOSIS — Z7982 Long term (current) use of aspirin: Secondary | ICD-10-CM | POA: Diagnosis not present

## 2016-10-17 DIAGNOSIS — Z79899 Other long term (current) drug therapy: Secondary | ICD-10-CM | POA: Insufficient documentation

## 2016-10-17 DIAGNOSIS — R079 Chest pain, unspecified: Secondary | ICD-10-CM | POA: Insufficient documentation

## 2016-10-17 DIAGNOSIS — G629 Polyneuropathy, unspecified: Secondary | ICD-10-CM | POA: Insufficient documentation

## 2016-10-17 DIAGNOSIS — Z96642 Presence of left artificial hip joint: Secondary | ICD-10-CM | POA: Diagnosis not present

## 2016-10-17 DIAGNOSIS — F17210 Nicotine dependence, cigarettes, uncomplicated: Secondary | ICD-10-CM | POA: Diagnosis not present

## 2016-10-17 DIAGNOSIS — J45909 Unspecified asthma, uncomplicated: Secondary | ICD-10-CM | POA: Diagnosis not present

## 2016-10-17 DIAGNOSIS — R200 Anesthesia of skin: Secondary | ICD-10-CM | POA: Diagnosis present

## 2016-10-17 LAB — COMPREHENSIVE METABOLIC PANEL
ALT: 13 U/L — ABNORMAL LOW (ref 17–63)
AST: 21 U/L (ref 15–41)
Albumin: 2.8 g/dL — ABNORMAL LOW (ref 3.5–5.0)
Alkaline Phosphatase: 189 U/L — ABNORMAL HIGH (ref 38–126)
Anion gap: 9 (ref 5–15)
BUN: <5 mg/dL — ABNORMAL LOW (ref 6–20)
CO2: 23 mmol/L (ref 22–32)
Calcium: 8.4 mg/dL — ABNORMAL LOW (ref 8.9–10.3)
Chloride: 104 mmol/L (ref 101–111)
Creatinine, Ser: 0.63 mg/dL (ref 0.61–1.24)
GFR calc Af Amer: >60 mL/min (ref >60)
GFR calc non Af Amer: >60 mL/min (ref >60)
Glucose, Bld: 105 mg/dL — ABNORMAL HIGH (ref 65–99)
Potassium: 3.1 mmol/L — ABNORMAL LOW (ref 3.5–5.1)
Sodium: 136 mmol/L (ref 135–145)
Total Bilirubin: 0.4 mg/dL (ref 0.3–1.2)
Total Protein: 5.8 g/dL — ABNORMAL LOW (ref 6.5–8.1)

## 2016-10-17 LAB — CBC WITH DIFFERENTIAL/PLATELET
Basophils Absolute: 0 10*3/uL (ref 0.0–0.1)
Basophils Relative: 1 %
Eosinophils Absolute: 0.1 10*3/uL (ref 0.0–0.7)
Eosinophils Relative: 3 %
HCT: 31.6 % — ABNORMAL LOW (ref 39.0–52.0)
Hemoglobin: 9.9 g/dL — ABNORMAL LOW (ref 13.0–17.0)
Lymphocytes Relative: 44 %
Lymphs Abs: 1.7 10*3/uL (ref 0.7–4.0)
MCH: 30.3 pg (ref 26.0–34.0)
MCHC: 31.3 g/dL (ref 30.0–36.0)
MCV: 96.6 fL (ref 78.0–100.0)
Monocytes Absolute: 0.7 10*3/uL (ref 0.1–1.0)
Monocytes Relative: 18 %
Neutro Abs: 1.3 10*3/uL — ABNORMAL LOW (ref 1.7–7.7)
Neutrophils Relative %: 34 %
Platelets: 331 10*3/uL (ref 150–400)
RBC: 3.27 MIL/uL — ABNORMAL LOW (ref 4.22–5.81)
RDW: 20.2 % — ABNORMAL HIGH (ref 11.5–15.5)
WBC: 3.9 10*3/uL — ABNORMAL LOW (ref 4.0–10.5)

## 2016-10-17 MED ORDER — POTASSIUM CHLORIDE 20 MEQ/15ML (10%) PO SOLN
40.0000 meq | Freq: Once | ORAL | Status: AC
  Administered 2016-10-17: 40 meq via ORAL
  Filled 2016-10-17: qty 30

## 2016-10-17 MED ORDER — IOPAMIDOL (ISOVUE-300) INJECTION 61%
INTRAVENOUS | Status: AC
  Administered 2016-10-17: 75 mL
  Filled 2016-10-17: qty 75

## 2016-10-17 MED ORDER — GABAPENTIN 300 MG PO CAPS: 300.0000 mg | ORAL_CAPSULE | Freq: Three times a day (TID) | ORAL | 0 refills | Status: DC

## 2016-10-17 NOTE — ED Triage Notes (Signed)
Pt reports for over the last 2 weeks he has had numbness in his lower legs from his knees down bilaterally. Pt reports pain becomes worse when walking. pts feet are warm to touch, sensation intact. Pedal pulses intact.
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
## 2016-10-17 NOTE — ED Notes (Signed)
ED Provider at bedside. 

## 2016-10-17 NOTE — ED Notes (Signed)
Pt reports weight loss of 30 lbs over 3-4 months.

## 2016-10-17 NOTE — ED Notes (Signed)
Pt ambulatory at DC, verbalizes DC teaching and medication.

## 2016-10-17 NOTE — Discharge Instructions (Signed)
I think your foot pain is due to peripheral neuropathy.  I will prescribe you an increased dose of gabapentin for the pain.  Please schedule an appointment with your new PCP as soon as possible.  I have also put in a referral to see a neurologist - they should call you the schedule an appointment in the next few days.

## 2016-10-17 NOTE — ED Notes (Signed)
Pt reports that he had sudden onset of bilateral leg numbness 10 days ago. Friend at bedside reports that it started with just his feet and has worked its way up to his knees. Reports his father had a similar episode and was diagnosed with blood clots and had to have stents placed. Friend at bedside also reports concern for diabetes, never diagnosed.

## 2016-10-17 NOTE — ED Notes (Signed)
Pt removed monitor stating MD tole him he was going to be discharge. No DC orders placed yet.

## 2016-10-17 NOTE — ED Notes (Addendum)
Patient transported to CT 

## 2016-10-18 LAB — HIV ANTIBODY (ROUTINE TESTING W REFLEX): HIV Screen 4th Generation wRfx: NONREACTIVE

## 2016-10-20 DIAGNOSIS — D649 Anemia, unspecified: Secondary | ICD-10-CM | POA: Insufficient documentation

## 2016-10-27 ENCOUNTER — Ambulatory Visit: Payer: Medicare Other | Admitting: Oncology

## 2016-10-30 ENCOUNTER — Encounter: Payer: Self-pay | Admitting: Emergency Medicine

## 2016-10-30 ENCOUNTER — Emergency Department
Admission: EM | Admit: 2016-10-30 | Discharge: 2016-10-30 | Disposition: A | Discharge status: Home / Self Care | Payer: Medicare Other | Attending: Emergency Medicine | Admitting: Emergency Medicine

## 2016-10-30 DIAGNOSIS — F17210 Nicotine dependence, cigarettes, uncomplicated: Secondary | ICD-10-CM | POA: Insufficient documentation

## 2016-10-30 DIAGNOSIS — Z5321 Procedure and treatment not carried out due to patient leaving prior to being seen by health care provider: Secondary | ICD-10-CM | POA: Diagnosis not present

## 2016-10-30 DIAGNOSIS — J449 Chronic obstructive pulmonary disease, unspecified: Secondary | ICD-10-CM | POA: Insufficient documentation

## 2016-10-30 DIAGNOSIS — Z7982 Long term (current) use of aspirin: Secondary | ICD-10-CM | POA: Diagnosis not present

## 2016-10-30 DIAGNOSIS — I10 Essential (primary) hypertension: Secondary | ICD-10-CM | POA: Insufficient documentation

## 2016-10-30 DIAGNOSIS — R55 Syncope and collapse: Secondary | ICD-10-CM | POA: Diagnosis not present

## 2016-10-30 LAB — CBC
HCT: 36.1 % — ABNORMAL LOW (ref 40.0–52.0)
Hemoglobin: 12 g/dL — ABNORMAL LOW (ref 13.0–18.0)
MCH: 32.5 pg (ref 26.0–34.0)
MCHC: 33.2 g/dL (ref 32.0–36.0)
MCV: 97.8 fL (ref 80.0–100.0)
Platelets: 361 10*3/uL (ref 150–440)
RBC: 3.7 MIL/uL — ABNORMAL LOW (ref 4.40–5.90)
RDW: 18.3 % — ABNORMAL HIGH (ref 11.5–14.5)
WBC: 4.8 10*3/uL (ref 3.8–10.6)

## 2016-10-30 LAB — TYPE AND SCREEN
ABO/RH(D): A POS
Antibody Screen: NEGATIVE

## 2016-10-30 LAB — BASIC METABOLIC PANEL
Anion gap: 13 (ref 5–15)
BUN: 6 mg/dL (ref 6–20)
CO2: 23 mmol/L (ref 22–32)
Calcium: 9.2 mg/dL (ref 8.9–10.3)
Chloride: 100 mmol/L — ABNORMAL LOW (ref 101–111)
Creatinine, Ser: 0.64 mg/dL (ref 0.61–1.24)
GFR calc Af Amer: >60 mL/min (ref >60)
GFR calc non Af Amer: >60 mL/min (ref >60)
Glucose, Bld: 99 mg/dL (ref 65–99)
Potassium: 3.4 mmol/L — ABNORMAL LOW (ref 3.5–5.1)
Sodium: 136 mmol/L (ref 135–145)

## 2016-10-30 LAB — GLUCOSE, CAPILLARY: Glucose-Capillary: 96 mg/dL (ref 65–99)

## 2016-10-30 MED ORDER — IBUPROFEN 400 MG PO TABS
400.0000 mg | ORAL_TABLET | Freq: Once | ORAL | Status: AC | PRN
  Administered 2016-10-30: 400 mg via ORAL
  Filled 2016-10-30: qty 1

## 2016-10-30 MED ORDER — ONDANSETRON HCL 4 MG/2ML IJ SOLN
4.0000 mg | Freq: Once | INTRAMUSCULAR | Status: AC
  Administered 2016-10-30: 4 mg via INTRAVENOUS
  Filled 2016-10-30: qty 2

## 2016-10-30 MED ORDER — SODIUM CHLORIDE 0.9 % IV BOLUS (SEPSIS)
1000.0000 mL | Freq: Once | INTRAVENOUS | Status: AC
  Administered 2016-10-30: 1000 mL via INTRAVENOUS

## 2016-10-30 NOTE — ED Triage Notes (Signed)
Patient demands to have IV dc'd and allowed to leave. Patient now pink was and dry, speaking loudly, gait steady. Leaves without being seen.

## 2016-10-30 NOTE — ED Triage Notes (Addendum)
Patient presents to the ED post syncopal episode [place] the [place].  Patient states after passing out he has vomited x 2.  Patient states he did not hit his head when he passed out.  Patient's wife states he passed out in April as well.  Patient's hemoglobin was 9.9 2 weeks ago and patient has an appointment with oncologist soon to discuss.  Patient appears very pale and skin is clammy.  Patient is complaining of a headache and states he has an abrasion to his left elbow from the fall but denies severe pain to that area.

## 2016-11-01 DIAGNOSIS — D509 Iron deficiency anemia, unspecified: Secondary | ICD-10-CM | POA: Insufficient documentation
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
## 2016-11-03 ENCOUNTER — Encounter: Payer: Self-pay | Admitting: Oncology

## 2016-11-03 ENCOUNTER — Ambulatory Visit: Payer: Self-pay | Admitting: Family Medicine

## 2016-11-03 ENCOUNTER — Inpatient Hospital Stay: Payer: Medicare Other | Attending: Oncology | Admitting: Oncology

## 2016-11-03 ENCOUNTER — Inpatient Hospital Stay: Payer: Medicare Other

## 2016-11-03 DIAGNOSIS — J449 Chronic obstructive pulmonary disease, unspecified: Secondary | ICD-10-CM | POA: Diagnosis not present

## 2016-11-03 DIAGNOSIS — Z87442 Personal history of urinary calculi: Secondary | ICD-10-CM | POA: Diagnosis not present

## 2016-11-03 DIAGNOSIS — K449 Diaphragmatic hernia without obstruction or gangrene: Secondary | ICD-10-CM | POA: Insufficient documentation

## 2016-11-03 DIAGNOSIS — G629 Polyneuropathy, unspecified: Secondary | ICD-10-CM

## 2016-11-03 DIAGNOSIS — D649 Anemia, unspecified: Secondary | ICD-10-CM

## 2016-11-03 DIAGNOSIS — Z79899 Other long term (current) drug therapy: Secondary | ICD-10-CM

## 2016-11-03 DIAGNOSIS — Z7982 Long term (current) use of aspirin: Secondary | ICD-10-CM | POA: Insufficient documentation

## 2016-11-03 DIAGNOSIS — K219 Gastro-esophageal reflux disease without esophagitis: Secondary | ICD-10-CM | POA: Diagnosis not present

## 2016-11-03 DIAGNOSIS — B3781 Candidal esophagitis: Secondary | ICD-10-CM | POA: Diagnosis not present

## 2016-11-03 DIAGNOSIS — R634 Abnormal weight loss: Secondary | ICD-10-CM

## 2016-11-03 DIAGNOSIS — I252 Old myocardial infarction: Secondary | ICD-10-CM | POA: Diagnosis not present

## 2016-11-03 DIAGNOSIS — R591 Generalized enlarged lymph nodes: Secondary | ICD-10-CM | POA: Diagnosis not present

## 2016-11-03 DIAGNOSIS — F17210 Nicotine dependence, cigarettes, uncomplicated: Secondary | ICD-10-CM | POA: Diagnosis not present

## 2016-11-03 DIAGNOSIS — I10 Essential (primary) hypertension: Secondary | ICD-10-CM | POA: Diagnosis not present

## 2016-11-03 DIAGNOSIS — F329 Major depressive disorder, single episode, unspecified: Secondary | ICD-10-CM | POA: Insufficient documentation

## 2016-11-03 LAB — IRON AND TIBC
Iron: 43 ug/dL — ABNORMAL LOW (ref 45–182)
Saturation Ratios: 15 % — ABNORMAL LOW (ref 17.9–39.5)
TIBC: 281 ug/dL (ref 250–450)
UIBC: 238 ug/dL

## 2016-11-03 LAB — DAT, POLYSPECIFIC AHG (ARMC ONLY): Polyspecific AHG test: NEGATIVE

## 2016-11-03 LAB — RETICULOCYTES
RBC.: 3.28 MIL/uL — ABNORMAL LOW (ref 4.40–5.90)
Retic Count, Absolute: 45.9 10*3/uL (ref 19.0–183.0)
Retic Ct Pct: 1.4 % (ref 0.4–3.1)

## 2016-11-03 LAB — CBC
HCT: 31.3 % — ABNORMAL LOW (ref 40.0–52.0)
Hemoglobin: 10.3 g/dL — ABNORMAL LOW (ref 13.0–18.0)
MCH: 32 pg (ref 26.0–34.0)
MCHC: 32.9 g/dL (ref 32.0–36.0)
MCV: 97.2 fL (ref 80.0–100.0)
Platelets: 270 10*3/uL (ref 150–440)
RBC: 3.22 MIL/uL — ABNORMAL LOW (ref 4.40–5.90)
RDW: 18.1 % — ABNORMAL HIGH (ref 11.5–14.5)
WBC: 3.1 10*3/uL — ABNORMAL LOW (ref 3.8–10.6)

## 2016-11-03 LAB — LACTATE DEHYDROGENASE: LDH: 104 U/L (ref 98–192)

## 2016-11-03 LAB — FERRITIN: Ferritin: 113 ng/mL (ref 24–336)

## 2016-11-03 NOTE — Progress Notes (Signed)
Patient reports weight loss of 35 lbs in the last 6 months without trying, no trouble swallowing at this point, noted with numbness and tingling in feet bilaterally, patient reports passing out at intervals that he has attributed to the use of gabapentin so he stopped taking that medication.

## 2016-11-04 LAB — HAPTOGLOBIN: Haptoglobin: 289 mg/dL — ABNORMAL HIGH (ref 34–200)

## 2016-11-04 LAB — ANA W/REFLEX: Anti Nuclear Antibody(ANA): NEGATIVE

## 2016-11-04 LAB — ERYTHROPOIETIN: Erythropoietin: 177.8 m[IU]/mL — ABNORMAL HIGH (ref 2.6–18.5)

## 2016-11-06 LAB — PROTEIN ELECTROPHORESIS, SERUM
A/G Ratio: 1.1 (ref 0.7–1.7)
Albumin ELP: 3.2 g/dL (ref 2.9–4.4)
Alpha-1-Globulin: 0.3 g/dL (ref 0.0–0.4)
Alpha-2-Globulin: 0.8 g/dL (ref 0.4–1.0)
Beta Globulin: 1 g/dL (ref 0.7–1.3)
Gamma Globulin: 0.7 g/dL (ref 0.4–1.8)
Globulin, Total: 2.8 g/dL (ref 2.2–3.9)
Total Protein ELP: 6 g/dL (ref 6.0–8.5)

## 2016-11-09 LAB — COMP PANEL: LEUKEMIA/LYMPHOMA
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
## 2016-11-17 ENCOUNTER — Inpatient Hospital Stay: Payer: Medicare Other

## 2016-11-17 ENCOUNTER — Inpatient Hospital Stay: Payer: Medicare Other | Attending: Oncology | Admitting: Oncology

## 2016-11-17 ENCOUNTER — Ambulatory Visit: Payer: Self-pay | Admitting: Family Medicine

## 2016-11-17 ENCOUNTER — Ambulatory Visit: Payer: Medicare Other | Admitting: Oncology

## 2016-11-17 VITALS — BP 134/82 | HR 87 | Temp 97.3°F | Resp 18 | Wt 122.4 lb

## 2016-11-17 DIAGNOSIS — F17210 Nicotine dependence, cigarettes, uncomplicated: Secondary | ICD-10-CM

## 2016-11-17 DIAGNOSIS — Z7982 Long term (current) use of aspirin: Secondary | ICD-10-CM | POA: Diagnosis not present

## 2016-11-17 DIAGNOSIS — R197 Diarrhea, unspecified: Secondary | ICD-10-CM

## 2016-11-17 DIAGNOSIS — Z87442 Personal history of urinary calculi: Secondary | ICD-10-CM | POA: Diagnosis not present

## 2016-11-17 DIAGNOSIS — R591 Generalized enlarged lymph nodes: Secondary | ICD-10-CM | POA: Diagnosis not present

## 2016-11-17 DIAGNOSIS — K219 Gastro-esophageal reflux disease without esophagitis: Secondary | ICD-10-CM | POA: Insufficient documentation

## 2016-11-17 DIAGNOSIS — G629 Polyneuropathy, unspecified: Secondary | ICD-10-CM | POA: Diagnosis not present

## 2016-11-17 DIAGNOSIS — D508 Other iron deficiency anemias: Secondary | ICD-10-CM

## 2016-11-17 DIAGNOSIS — I252 Old myocardial infarction: Secondary | ICD-10-CM | POA: Insufficient documentation

## 2016-11-17 DIAGNOSIS — F329 Major depressive disorder, single episode, unspecified: Secondary | ICD-10-CM | POA: Diagnosis not present

## 2016-11-17 DIAGNOSIS — B3781 Candidal esophagitis: Secondary | ICD-10-CM

## 2016-11-17 DIAGNOSIS — Z79899 Other long term (current) drug therapy: Secondary | ICD-10-CM

## 2016-11-17 DIAGNOSIS — J449 Chronic obstructive pulmonary disease, unspecified: Secondary | ICD-10-CM | POA: Insufficient documentation

## 2016-11-17 DIAGNOSIS — D509 Iron deficiency anemia, unspecified: Secondary | ICD-10-CM | POA: Diagnosis not present

## 2016-11-17 DIAGNOSIS — D72819 Decreased white blood cell count, unspecified: Secondary | ICD-10-CM

## 2016-11-17 DIAGNOSIS — I10 Essential (primary) hypertension: Secondary | ICD-10-CM | POA: Insufficient documentation

## 2016-11-17 DIAGNOSIS — R634 Abnormal weight loss: Secondary | ICD-10-CM | POA: Diagnosis not present

## 2016-11-17 MED ORDER — SODIUM CHLORIDE 0.9 % IV SOLN
Freq: Once | INTRAVENOUS | Status: AC
  Administered 2016-11-17: 10:00:00 via INTRAVENOUS
  Filled 2016-11-17: qty 1000

## 2016-11-17 MED ORDER — SODIUM CHLORIDE 0.9 % IV SOLN
510.0000 mg | Freq: Once | INTRAVENOUS | Status: AC
  Administered 2016-11-17: 510 mg via INTRAVENOUS
  Filled 2016-11-17: qty 17

## 2016-11-17 NOTE — Progress Notes (Signed)
Here for follow up. Per pt passed out may 14 th and taken to [facility] and ed gave ivf-felt better after . diahhrrea x 1 wk-4-6 loose stools per day feeling weak

## 2016-11-17 NOTE — Patient Instructions (Signed)

## 2016-11-20 ENCOUNTER — Encounter: Payer: Self-pay | Admitting: Infectious Diseases

## 2016-11-20 ENCOUNTER — Encounter: Payer: Self-pay | Admitting: Oncology

## 2016-11-21 ENCOUNTER — Telehealth: Payer: Self-pay

## 2016-11-21 ENCOUNTER — Telehealth: Payer: Self-pay | Admitting: Family Medicine
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
## 2016-11-21 NOTE — Telephone Encounter (Signed)
Pt's wife called wanting an appt for pt. When his wife called in on Friday 11/17/16 she spoke to [place] in a very ugly fashion and therefore, the doctors feel that it is in the best interest of the patient and facility that he be seen at another office.

## 2016-11-24 ENCOUNTER — Ambulatory Visit: Payer: Medicare Other

## 2016-11-24 ENCOUNTER — Inpatient Hospital Stay: Payer: Medicare Other

## 2016-11-24 VITALS — BP 145/79 | HR 75 | Temp 97.2°F | Resp 18

## 2016-11-24 DIAGNOSIS — D508 Other iron deficiency anemias: Secondary | ICD-10-CM

## 2016-11-24 DIAGNOSIS — D509 Iron deficiency anemia, unspecified: Secondary | ICD-10-CM | POA: Diagnosis not present

## 2016-11-24 MED ORDER — SODIUM CHLORIDE 0.9 % IV SOLN
Freq: Once | INTRAVENOUS | Status: AC
  Administered 2016-11-24: 11:00:00 via INTRAVENOUS
  Filled 2016-11-24: qty 1000

## 2016-11-24 MED ORDER — SODIUM CHLORIDE 0.9 % IV SOLN
510.0000 mg | Freq: Once | INTRAVENOUS | Status: AC
  Administered 2016-11-24: 510 mg via INTRAVENOUS
  Filled 2016-11-24: qty 17

## 2016-11-24 NOTE — Patient Instructions (Signed)

## 2016-11-27 DIAGNOSIS — R053 Chronic cough: Secondary | ICD-10-CM | POA: Insufficient documentation

## 2016-11-27 DIAGNOSIS — R05 Cough: Secondary | ICD-10-CM | POA: Insufficient documentation

## 2016-11-27 DIAGNOSIS — R634 Abnormal weight loss: Secondary | ICD-10-CM

## 2016-12-07 DIAGNOSIS — M25561 Pain in right knee: Secondary | ICD-10-CM

## 2016-12-07 DIAGNOSIS — M25562 Pain in left knee: Secondary | ICD-10-CM

## 2016-12-07 DIAGNOSIS — Z96642 Presence of left artificial hip joint: Secondary | ICD-10-CM | POA: Insufficient documentation

## 2016-12-07 DIAGNOSIS — G8929 Other chronic pain: Secondary | ICD-10-CM | POA: Insufficient documentation

## 2017-01-11 ENCOUNTER — Inpatient Hospital Stay: Payer: Medicare Other | Attending: Oncology

## 2017-01-11 ENCOUNTER — Other Ambulatory Visit: Payer: Medicare Other
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
## 2017-01-12 ENCOUNTER — Ambulatory Visit: Payer: Medicare Other

## 2017-01-12 ENCOUNTER — Ambulatory Visit: Payer: Medicare Other | Admitting: Oncology

## 2017-01-12 ENCOUNTER — Inpatient Hospital Stay: Payer: Medicare Other | Admitting: Oncology

## 2017-01-12 ENCOUNTER — Inpatient Hospital Stay: Payer: Medicare Other

## 2017-01-17 ENCOUNTER — Inpatient Hospital Stay: Payer: Medicare Other | Attending: Oncology

## 2017-01-17 DIAGNOSIS — F17210 Nicotine dependence, cigarettes, uncomplicated: Secondary | ICD-10-CM | POA: Insufficient documentation

## 2017-01-17 DIAGNOSIS — Z79899 Other long term (current) drug therapy: Secondary | ICD-10-CM | POA: Diagnosis not present

## 2017-01-17 DIAGNOSIS — G629 Polyneuropathy, unspecified: Secondary | ICD-10-CM | POA: Diagnosis not present

## 2017-01-17 DIAGNOSIS — I252 Old myocardial infarction: Secondary | ICD-10-CM | POA: Diagnosis not present

## 2017-01-17 DIAGNOSIS — J449 Chronic obstructive pulmonary disease, unspecified: Secondary | ICD-10-CM | POA: Diagnosis not present

## 2017-01-17 DIAGNOSIS — Z7982 Long term (current) use of aspirin: Secondary | ICD-10-CM | POA: Diagnosis not present

## 2017-01-17 DIAGNOSIS — Z87442 Personal history of urinary calculi: Secondary | ICD-10-CM | POA: Diagnosis not present

## 2017-01-17 DIAGNOSIS — D509 Iron deficiency anemia, unspecified: Secondary | ICD-10-CM | POA: Insufficient documentation

## 2017-01-17 DIAGNOSIS — D72819 Decreased white blood cell count, unspecified: Secondary | ICD-10-CM | POA: Insufficient documentation

## 2017-01-17 DIAGNOSIS — K219 Gastro-esophageal reflux disease without esophagitis: Secondary | ICD-10-CM | POA: Diagnosis not present

## 2017-01-17 DIAGNOSIS — F329 Major depressive disorder, single episode, unspecified: Secondary | ICD-10-CM | POA: Diagnosis not present

## 2017-01-17 DIAGNOSIS — D508 Other iron deficiency anemias: Secondary | ICD-10-CM

## 2017-01-17 DIAGNOSIS — I10 Essential (primary) hypertension: Secondary | ICD-10-CM | POA: Insufficient documentation

## 2017-01-17 LAB — CBC WITH DIFFERENTIAL/PLATELET
Basophils Absolute: 0.1 10*3/uL (ref 0–0.1)
Basophils Relative: 1 %
Eosinophils Absolute: 0.2 10*3/uL (ref 0–0.7)
Eosinophils Relative: 2 %
HCT: 45 % (ref 40.0–52.0)
Hemoglobin: 15 g/dL (ref 13.0–18.0)
Lymphocytes Relative: 18 %
Lymphs Abs: 2.8 10*3/uL (ref 1.0–3.6)
MCH: 31.7 pg (ref 26.0–34.0)
MCHC: 33.3 g/dL (ref 32.0–36.0)
MCV: 95.3 fL (ref 80.0–100.0)
Monocytes Absolute: 1 10*3/uL (ref 0.2–1.0)
Monocytes Relative: 6 %
Neutro Abs: 11.2 10*3/uL — ABNORMAL HIGH (ref 1.4–6.5)
Neutrophils Relative %: 73 %
Platelets: 384 10*3/uL (ref 150–440)
RBC: 4.72 MIL/uL (ref 4.40–5.90)
RDW: 16.2 % — ABNORMAL HIGH (ref 11.5–14.5)
WBC: 15.3 10*3/uL — ABNORMAL HIGH (ref 3.8–10.6)

## 2017-01-17 LAB — IRON AND TIBC
Iron: 31 ug/dL — ABNORMAL LOW (ref 45–182)
Saturation Ratios: 15 % — ABNORMAL LOW (ref 17.9–39.5)
TIBC: 211 ug/dL — ABNORMAL LOW (ref 250–450)
UIBC: 180 ug/dL

## 2017-01-17 LAB — FERRITIN: Ferritin: 396 ng/mL — ABNORMAL HIGH (ref 24–336)

## 2017-01-19 ENCOUNTER — Inpatient Hospital Stay (HOSPITAL_BASED_OUTPATIENT_CLINIC_OR_DEPARTMENT_OTHER): Payer: Medicare Other | Admitting: Oncology

## 2017-01-19 ENCOUNTER — Inpatient Hospital Stay: Payer: Medicare Other

## 2017-01-19 VITALS — BP 131/80 | HR 73 | Temp 96.8°F | Resp 20 | Wt 127.2 lb

## 2017-01-19 DIAGNOSIS — F17210 Nicotine dependence, cigarettes, uncomplicated: Secondary | ICD-10-CM | POA: Diagnosis not present

## 2017-01-19 DIAGNOSIS — Z79899 Other long term (current) drug therapy: Secondary | ICD-10-CM

## 2017-01-19 DIAGNOSIS — D509 Iron deficiency anemia, unspecified: Secondary | ICD-10-CM | POA: Diagnosis not present

## 2017-01-19 DIAGNOSIS — G629 Polyneuropathy, unspecified: Secondary | ICD-10-CM | POA: Diagnosis not present

## 2017-01-19 DIAGNOSIS — D508 Other iron deficiency anemias: Secondary | ICD-10-CM

## 2017-01-19 DIAGNOSIS — R0602 Shortness of breath: Secondary | ICD-10-CM

## 2017-01-19 DIAGNOSIS — D72819 Decreased white blood cell count, unspecified: Secondary | ICD-10-CM | POA: Diagnosis not present

## 2017-01-19 DIAGNOSIS — R05 Cough: Secondary | ICD-10-CM

## 2017-01-19 DIAGNOSIS — R197 Diarrhea, unspecified: Secondary | ICD-10-CM

## 2017-01-19 DIAGNOSIS — R059 Cough, unspecified: Secondary | ICD-10-CM

## 2017-01-19 DIAGNOSIS — R634 Abnormal weight loss: Secondary | ICD-10-CM

## 2017-01-19 NOTE — Progress Notes (Signed)
Patient denies any concerns today.  
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
## 2017-03-15 DIAGNOSIS — M79672 Pain in left foot: Secondary | ICD-10-CM

## 2017-03-15 DIAGNOSIS — M79671 Pain in right foot: Secondary | ICD-10-CM

## 2017-03-15 DIAGNOSIS — M79605 Pain in left leg: Secondary | ICD-10-CM

## 2017-03-15 DIAGNOSIS — R200 Anesthesia of skin: Secondary | ICD-10-CM | POA: Insufficient documentation

## 2017-03-15 DIAGNOSIS — R202 Paresthesia of skin: Secondary | ICD-10-CM

## 2017-03-15 DIAGNOSIS — M79604 Pain in right leg: Secondary | ICD-10-CM | POA: Insufficient documentation

## 2017-03-19 ENCOUNTER — Encounter (INDEPENDENT_AMBULATORY_CARE_PROVIDER_SITE_OTHER): Payer: Self-pay | Admitting: Vascular Surgery

## 2017-03-19 ENCOUNTER — Ambulatory Visit (INDEPENDENT_AMBULATORY_CARE_PROVIDER_SITE_OTHER): Payer: Medicare Other | Admitting: Vascular Surgery

## 2017-03-19 VITALS — BP 149/84 | HR 66 | Resp 16 | Ht 66.0 in | Wt 124.0 lb

## 2017-03-19 DIAGNOSIS — M79604 Pain in right leg: Secondary | ICD-10-CM

## 2017-03-19 DIAGNOSIS — E782 Mixed hyperlipidemia: Secondary | ICD-10-CM

## 2017-03-19 DIAGNOSIS — I10 Essential (primary) hypertension: Secondary | ICD-10-CM

## 2017-03-19 DIAGNOSIS — M4726 Other spondylosis with radiculopathy, lumbar region: Secondary | ICD-10-CM | POA: Diagnosis not present

## 2017-03-19 DIAGNOSIS — M79605 Pain in left leg: Secondary | ICD-10-CM

## 2017-03-19 DIAGNOSIS — M47816 Spondylosis without myelopathy or radiculopathy, lumbar region: Secondary | ICD-10-CM | POA: Insufficient documentation
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
## 2017-03-20 DIAGNOSIS — G608 Other hereditary and idiopathic neuropathies: Secondary | ICD-10-CM

## 2017-03-20 HISTORY — DX: Other hereditary and idiopathic neuropathies: G60.8

## 2017-04-06 ENCOUNTER — Other Ambulatory Visit: Payer: Self-pay | Admitting: Neurology

## 2017-04-06 DIAGNOSIS — M5416 Radiculopathy, lumbar region: Secondary | ICD-10-CM

## 2017-04-12 ENCOUNTER — Ambulatory Visit
Admission: RE | Admit: 2017-04-12 | Discharge: 2017-04-12 | Disposition: A | Discharge status: Home / Self Care | Payer: Medicare Other | Source: Ambulatory Visit | Attending: Neurology | Admitting: Neurology

## 2017-04-12 DIAGNOSIS — M5416 Radiculopathy, lumbar region: Secondary | ICD-10-CM | POA: Diagnosis not present

## 2017-04-12 DIAGNOSIS — M5126 Other intervertebral disc displacement, lumbar region: Secondary | ICD-10-CM | POA: Diagnosis not present

## 2017-04-17 ENCOUNTER — Other Ambulatory Visit (INDEPENDENT_AMBULATORY_CARE_PROVIDER_SITE_OTHER): Payer: Self-pay | Admitting: Vascular Surgery

## 2017-04-17 ENCOUNTER — Other Ambulatory Visit: Payer: Self-pay | Admitting: Oncology

## 2017-04-17 DIAGNOSIS — I739 Peripheral vascular disease, unspecified: Secondary | ICD-10-CM

## 2017-04-17 DIAGNOSIS — D509 Iron deficiency anemia, unspecified: Secondary | ICD-10-CM

## 2017-04-18 ENCOUNTER — Ambulatory Visit (INDEPENDENT_AMBULATORY_CARE_PROVIDER_SITE_OTHER): Payer: Medicare Other | Admitting: Vascular Surgery

## 2017-04-18 ENCOUNTER — Inpatient Hospital Stay: Payer: Medicare Other | Attending: Oncology

## 2017-04-18 ENCOUNTER — Ambulatory Visit (INDEPENDENT_AMBULATORY_CARE_PROVIDER_SITE_OTHER): Payer: Medicare Other

## 2017-04-18 ENCOUNTER — Encounter (INDEPENDENT_AMBULATORY_CARE_PROVIDER_SITE_OTHER): Payer: Self-pay | Admitting: Vascular Surgery

## 2017-04-18 VITALS — BP 162/86 | HR 58 | Resp 18 | Ht 67.0 in | Wt 127.0 lb

## 2017-04-18 DIAGNOSIS — F329 Major depressive disorder, single episode, unspecified: Secondary | ICD-10-CM | POA: Insufficient documentation

## 2017-04-18 DIAGNOSIS — I10 Essential (primary) hypertension: Secondary | ICD-10-CM | POA: Insufficient documentation

## 2017-04-18 DIAGNOSIS — D509 Iron deficiency anemia, unspecified: Secondary | ICD-10-CM | POA: Diagnosis present

## 2017-04-18 DIAGNOSIS — Z720 Tobacco use: Secondary | ICD-10-CM

## 2017-04-18 DIAGNOSIS — F17210 Nicotine dependence, cigarettes, uncomplicated: Secondary | ICD-10-CM | POA: Diagnosis not present

## 2017-04-18 DIAGNOSIS — M79604 Pain in right leg: Secondary | ICD-10-CM

## 2017-04-18 DIAGNOSIS — I739 Peripheral vascular disease, unspecified: Secondary | ICD-10-CM | POA: Diagnosis not present

## 2017-04-18 DIAGNOSIS — M79605 Pain in left leg: Secondary | ICD-10-CM | POA: Diagnosis not present

## 2017-04-18 DIAGNOSIS — Z7982 Long term (current) use of aspirin: Secondary | ICD-10-CM | POA: Diagnosis not present

## 2017-04-18 DIAGNOSIS — K219 Gastro-esophageal reflux disease without esophagitis: Secondary | ICD-10-CM | POA: Insufficient documentation

## 2017-04-18 DIAGNOSIS — G629 Polyneuropathy, unspecified: Secondary | ICD-10-CM | POA: Diagnosis not present

## 2017-04-18 DIAGNOSIS — D72819 Decreased white blood cell count, unspecified: Secondary | ICD-10-CM | POA: Diagnosis not present

## 2017-04-18 DIAGNOSIS — Z87442 Personal history of urinary calculi: Secondary | ICD-10-CM | POA: Diagnosis not present

## 2017-04-18 DIAGNOSIS — J449 Chronic obstructive pulmonary disease, unspecified: Secondary | ICD-10-CM | POA: Insufficient documentation

## 2017-04-18 DIAGNOSIS — I252 Old myocardial infarction: Secondary | ICD-10-CM | POA: Diagnosis not present

## 2017-04-18 DIAGNOSIS — Z79899 Other long term (current) drug therapy: Secondary | ICD-10-CM | POA: Diagnosis not present

## 2017-04-18 LAB — CBC WITH DIFFERENTIAL/PLATELET
Basophils Absolute: 0 10*3/uL (ref 0–0.1)
Basophils Relative: 1 %
Eosinophils Absolute: 0.2 10*3/uL (ref 0–0.7)
Eosinophils Relative: 2 %
HCT: 43.1 % (ref 40.0–52.0)
Hemoglobin: 14.3 g/dL (ref 13.0–18.0)
Lymphocytes Relative: 22 %
Lymphs Abs: 2.4 10*3/uL (ref 1.0–3.6)
MCH: 31.4 pg (ref 26.0–34.0)
MCHC: 33.3 g/dL (ref 32.0–36.0)
MCV: 94.4 fL (ref 80.0–100.0)
Monocytes Absolute: 0.7 10*3/uL (ref 0.2–1.0)
Monocytes Relative: 7 %
Neutro Abs: 7.4 10*3/uL — ABNORMAL HIGH (ref 1.4–6.5)
Neutrophils Relative %: 68 %
Platelets: 281 10*3/uL (ref 150–440)
RBC: 4.56 MIL/uL (ref 4.40–5.90)
RDW: 14.6 % — ABNORMAL HIGH (ref 11.5–14.5)
WBC: 10.9 10*3/uL — ABNORMAL HIGH (ref 3.8–10.6)

## 2017-04-18 LAB — COMPREHENSIVE METABOLIC PANEL
ALT: 15 U/L — ABNORMAL LOW (ref 17–63)
AST: 22 U/L (ref 15–41)
Albumin: 3.5 g/dL (ref 3.5–5.0)
Alkaline Phosphatase: 118 U/L (ref 38–126)
Anion gap: 8 (ref 5–15)
BUN: <5 mg/dL — ABNORMAL LOW (ref 6–20)
CO2: 26 mmol/L (ref 22–32)
Calcium: 8.6 mg/dL — ABNORMAL LOW (ref 8.9–10.3)
Chloride: 103 mmol/L (ref 101–111)
Creatinine, Ser: 0.8 mg/dL (ref 0.61–1.24)
GFR calc Af Amer: >60 mL/min (ref >60)
GFR calc non Af Amer: >60 mL/min (ref >60)
Glucose, Bld: 173 mg/dL — ABNORMAL HIGH (ref 65–99)
Potassium: 3.5 mmol/L (ref 3.5–5.1)
Sodium: 137 mmol/L (ref 135–145)
Total Bilirubin: 0.4 mg/dL (ref 0.3–1.2)
Total Protein: 6.8 g/dL (ref 6.5–8.1)

## 2017-04-18 LAB — IRON AND TIBC
Iron: 37 ug/dL — ABNORMAL LOW (ref 45–182)
Saturation Ratios: 22 % (ref 17.9–39.5)
TIBC: 170 ug/dL — ABNORMAL LOW (ref 250–450)
UIBC: 133 ug/dL
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
## 2017-04-20 ENCOUNTER — Inpatient Hospital Stay: Payer: Medicare Other

## 2017-04-20 ENCOUNTER — Inpatient Hospital Stay: Payer: Medicare Other | Attending: Oncology | Admitting: Oncology

## 2017-04-20 VITALS — BP 157/87 | HR 60 | Temp 97.5°F | Resp 20 | Wt 129.4 lb

## 2017-04-20 DIAGNOSIS — M5116 Intervertebral disc disorders with radiculopathy, lumbar region: Secondary | ICD-10-CM | POA: Diagnosis not present

## 2017-04-20 DIAGNOSIS — G629 Polyneuropathy, unspecified: Secondary | ICD-10-CM | POA: Insufficient documentation

## 2017-04-20 DIAGNOSIS — Z7982 Long term (current) use of aspirin: Secondary | ICD-10-CM | POA: Insufficient documentation

## 2017-04-20 DIAGNOSIS — R591 Generalized enlarged lymph nodes: Secondary | ICD-10-CM | POA: Diagnosis not present

## 2017-04-20 DIAGNOSIS — D508 Other iron deficiency anemias: Secondary | ICD-10-CM | POA: Diagnosis not present

## 2017-04-20 DIAGNOSIS — F17210 Nicotine dependence, cigarettes, uncomplicated: Secondary | ICD-10-CM | POA: Insufficient documentation

## 2017-04-20 DIAGNOSIS — Z87442 Personal history of urinary calculi: Secondary | ICD-10-CM | POA: Diagnosis not present

## 2017-04-20 DIAGNOSIS — Z79899 Other long term (current) drug therapy: Secondary | ICD-10-CM

## 2017-04-20 DIAGNOSIS — I10 Essential (primary) hypertension: Secondary | ICD-10-CM | POA: Insufficient documentation

## 2017-04-20 DIAGNOSIS — J449 Chronic obstructive pulmonary disease, unspecified: Secondary | ICD-10-CM | POA: Insufficient documentation

## 2017-04-20 DIAGNOSIS — F329 Major depressive disorder, single episode, unspecified: Secondary | ICD-10-CM | POA: Insufficient documentation

## 2017-04-20 DIAGNOSIS — K219 Gastro-esophageal reflux disease without esophagitis: Secondary | ICD-10-CM | POA: Diagnosis not present

## 2017-04-20 DIAGNOSIS — I252 Old myocardial infarction: Secondary | ICD-10-CM | POA: Diagnosis not present

## 2017-04-20 DIAGNOSIS — D72829 Elevated white blood cell count, unspecified: Secondary | ICD-10-CM | POA: Insufficient documentation

## 2017-04-20 DIAGNOSIS — D509 Iron deficiency anemia, unspecified: Secondary | ICD-10-CM | POA: Diagnosis not present

## 2017-04-20 NOTE — Progress Notes (Signed)
Patient denies any concerns today.  
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
## 2017-04-26 ENCOUNTER — Ambulatory Visit
Admission: RE | Admit: 2017-04-26 | Discharge: 2017-04-26 | Disposition: A | Discharge status: Home / Self Care | Payer: Medicare Other | Source: Ambulatory Visit | Attending: Oncology | Admitting: Oncology

## 2017-04-26 DIAGNOSIS — J449 Chronic obstructive pulmonary disease, unspecified: Secondary | ICD-10-CM | POA: Insufficient documentation

## 2017-04-26 DIAGNOSIS — R05 Cough: Secondary | ICD-10-CM

## 2017-04-26 DIAGNOSIS — R590 Localized enlarged lymph nodes: Secondary | ICD-10-CM | POA: Insufficient documentation

## 2017-04-26 DIAGNOSIS — R0602 Shortness of breath: Secondary | ICD-10-CM | POA: Insufficient documentation

## 2017-04-26 DIAGNOSIS — K449 Diaphragmatic hernia without obstruction or gangrene: Secondary | ICD-10-CM | POA: Insufficient documentation

## 2017-04-26 DIAGNOSIS — R059 Cough, unspecified: Secondary | ICD-10-CM

## 2017-04-26 MED ORDER — IOPAMIDOL (ISOVUE-300) INJECTION 61%
75.0000 mL | Freq: Once | INTRAVENOUS | Status: AC | PRN
  Administered 2017-04-26: 75 mL via INTRAVENOUS

## 2017-05-09 DIAGNOSIS — I351 Nonrheumatic aortic (valve) insufficiency: Secondary | ICD-10-CM

## 2017-05-09 HISTORY — DX: Nonrheumatic aortic (valve) insufficiency: I35.1

## 2017-07-05 ENCOUNTER — Ambulatory Visit: Payer: Medicare Other | Admitting: Student in an Organized Health Care Education/Training Program

## 2017-07-07 ENCOUNTER — Emergency Department
Admission: EM | Admit: 2017-07-07 | Discharge: 2017-07-07 | Disposition: A | Discharge status: Home / Self Care | Payer: Medicare Other | Attending: Emergency Medicine | Admitting: Emergency Medicine

## 2017-07-07 ENCOUNTER — Other Ambulatory Visit: Payer: Self-pay

## 2017-07-07 DIAGNOSIS — I10 Essential (primary) hypertension: Secondary | ICD-10-CM | POA: Diagnosis not present

## 2017-07-07 DIAGNOSIS — J45909 Unspecified asthma, uncomplicated: Secondary | ICD-10-CM | POA: Insufficient documentation

## 2017-07-07 DIAGNOSIS — R197 Diarrhea, unspecified: Secondary | ICD-10-CM | POA: Insufficient documentation

## 2017-07-07 DIAGNOSIS — Z96642 Presence of left artificial hip joint: Secondary | ICD-10-CM | POA: Diagnosis not present

## 2017-07-07 DIAGNOSIS — E876 Hypokalemia: Secondary | ICD-10-CM | POA: Diagnosis not present

## 2017-07-07 DIAGNOSIS — F329 Major depressive disorder, single episode, unspecified: Secondary | ICD-10-CM | POA: Diagnosis not present

## 2017-07-07 DIAGNOSIS — F17210 Nicotine dependence, cigarettes, uncomplicated: Secondary | ICD-10-CM | POA: Diagnosis not present

## 2017-07-07 DIAGNOSIS — I252 Old myocardial infarction: Secondary | ICD-10-CM | POA: Diagnosis not present

## 2017-07-07 DIAGNOSIS — J449 Chronic obstructive pulmonary disease, unspecified: Secondary | ICD-10-CM | POA: Insufficient documentation

## 2017-07-07 DIAGNOSIS — R112 Nausea with vomiting, unspecified: Secondary | ICD-10-CM | POA: Insufficient documentation

## 2017-07-07 LAB — COMPREHENSIVE METABOLIC PANEL
ALT: 14 U/L — ABNORMAL LOW (ref 17–63)
AST: 32 U/L (ref 15–41)
Albumin: 3.6 g/dL (ref 3.5–5.0)
Alkaline Phosphatase: 126 U/L (ref 38–126)
Anion gap: 12 (ref 5–15)
BUN: 5 mg/dL — ABNORMAL LOW (ref 6–20)
CO2: 26 mmol/L (ref 22–32)
Calcium: 9.2 mg/dL (ref 8.9–10.3)
Chloride: 101 mmol/L (ref 101–111)
Creatinine, Ser: 1 mg/dL (ref 0.61–1.24)
GFR calc Af Amer: >60 mL/min (ref >60)
GFR calc non Af Amer: >60 mL/min (ref >60)
Glucose, Bld: 132 mg/dL — ABNORMAL HIGH (ref 65–99)
Potassium: 2.9 mmol/L — ABNORMAL LOW (ref 3.5–5.1)
Sodium: 139 mmol/L (ref 135–145)
Total Bilirubin: 0.7 mg/dL (ref 0.3–1.2)
Total Protein: 7 g/dL (ref 6.5–8.1)

## 2017-07-07 LAB — LIPASE, BLOOD: Lipase: 36 U/L (ref 11–51)

## 2017-07-07 LAB — CBC
HCT: 47.3 % (ref 40.0–52.0)
Hemoglobin: 15.8 g/dL (ref 13.0–18.0)
MCH: 31.1 pg (ref 26.0–34.0)
MCHC: 33.4 g/dL (ref 32.0–36.0)
MCV: 93.1 fL (ref 80.0–100.0)
Platelets: 354 10*3/uL (ref 150–440)
RBC: 5.08 MIL/uL (ref 4.40–5.90)
RDW: 13.5 % (ref 11.5–14.5)
WBC: 10 10*3/uL (ref 3.8–10.6)

## 2017-07-07 LAB — ETHANOL: Alcohol, Ethyl (B): <10 mg/dL (ref <10)

## 2017-07-07 MED ORDER — PROMETHAZINE HCL 25 MG/ML IJ SOLN
INTRAMUSCULAR | Status: AC
  Filled 2017-07-07: qty 1

## 2017-07-07 MED ORDER — SODIUM CHLORIDE 0.9 % IV SOLN
Freq: Once | INTRAVENOUS | Status: AC
  Administered 2017-07-07: 18:00:00 via INTRAVENOUS

## 2017-07-07 MED ORDER — SODIUM CHLORIDE 0.9 % IV SOLN: Freq: Once | INTRAVENOUS | Status: DC

## 2017-07-07 MED ORDER — POTASSIUM CHLORIDE IN NACL 20-0.9 MEQ/L-% IV SOLN
Freq: Once | INTRAVENOUS | Status: AC
  Administered 2017-07-07: 18:00:00 via INTRAVENOUS
  Filled 2017-07-07: qty 1000

## 2017-07-07 MED ORDER — PROMETHAZINE HCL 25 MG/ML IJ SOLN
25.0000 mg | Freq: Once | INTRAMUSCULAR | Status: AC
  Administered 2017-07-07: 25 mg via INTRAVENOUS

## 2017-07-07 MED ORDER — PROMETHAZINE HCL 25 MG PO TABS: 25.0000 mg | ORAL_TABLET | ORAL | 1 refills | Status: DC | PRN

## 2017-07-07 MED ORDER — ONDANSETRON HCL 4 MG/2ML IJ SOLN
4.0000 mg | Freq: Once | INTRAMUSCULAR | Status: AC
Start: 2017-07-07 — End: 2017-07-07
  Administered 2017-07-07: 4 mg via INTRAVENOUS
  Filled 2017-07-07: qty 2

## 2017-07-07 MED ORDER — SUCRALFATE 1 G PO TABS: 1.0000 g | ORAL_TABLET | Freq: Four times a day (QID) | ORAL | 1 refills | Status: DC

## 2017-07-07 NOTE — ED Triage Notes (Signed)
Pt came to ED via pov c/o n/v/d for 1 week. Denies abdominal pain. VS stable.

## 2017-07-07 NOTE — ED Notes (Signed)
Pt reports no c/o N/V following Phenergan adminstration at 19:11.
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
## 2017-07-09 ENCOUNTER — Other Ambulatory Visit: Payer: Self-pay | Admitting: *Deleted

## 2017-07-09 DIAGNOSIS — D649 Anemia, unspecified: Secondary | ICD-10-CM
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
## 2017-07-16 ENCOUNTER — Inpatient Hospital Stay: Payer: Medicare Other

## 2017-07-20 ENCOUNTER — Ambulatory Visit: Payer: Medicare Other

## 2017-07-20 ENCOUNTER — Ambulatory Visit: Payer: Medicare Other | Admitting: Oncology

## 2017-07-25 ENCOUNTER — Encounter: Payer: Self-pay | Admitting: Student in an Organized Health Care Education/Training Program

## 2017-07-25 ENCOUNTER — Ambulatory Visit: Payer: Medicare Other | Admitting: Student in an Organized Health Care Education/Training Program

## 2017-07-25 ENCOUNTER — Ambulatory Visit
Payer: Medicare Other | Attending: Student in an Organized Health Care Education/Training Program | Admitting: Student in an Organized Health Care Education/Training Program

## 2017-07-25 VITALS — BP 147/81 | HR 63 | Temp 98.2°F | Resp 16 | Ht 67.0 in | Wt 120.0 lb

## 2017-07-25 DIAGNOSIS — H9190 Unspecified hearing loss, unspecified ear: Secondary | ICD-10-CM | POA: Insufficient documentation

## 2017-07-25 DIAGNOSIS — F1290 Cannabis use, unspecified, uncomplicated: Secondary | ICD-10-CM | POA: Insufficient documentation

## 2017-07-25 DIAGNOSIS — Z7982 Long term (current) use of aspirin: Secondary | ICD-10-CM | POA: Insufficient documentation

## 2017-07-25 DIAGNOSIS — Z888 Allergy status to other drugs, medicaments and biological substances status: Secondary | ICD-10-CM | POA: Diagnosis not present

## 2017-07-25 DIAGNOSIS — Z969 Presence of functional implant, unspecified: Secondary | ICD-10-CM | POA: Diagnosis not present

## 2017-07-25 DIAGNOSIS — M1610 Unilateral primary osteoarthritis, unspecified hip: Secondary | ICD-10-CM | POA: Diagnosis not present

## 2017-07-25 DIAGNOSIS — M5116 Intervertebral disc disorders with radiculopathy, lumbar region: Secondary | ICD-10-CM | POA: Insufficient documentation

## 2017-07-25 DIAGNOSIS — J449 Chronic obstructive pulmonary disease, unspecified: Secondary | ICD-10-CM | POA: Insufficient documentation

## 2017-07-25 DIAGNOSIS — M79672 Pain in left foot: Secondary | ICD-10-CM

## 2017-07-25 DIAGNOSIS — M79671 Pain in right foot: Secondary | ICD-10-CM | POA: Diagnosis not present

## 2017-07-25 DIAGNOSIS — E785 Hyperlipidemia, unspecified: Secondary | ICD-10-CM | POA: Diagnosis not present

## 2017-07-25 DIAGNOSIS — I252 Old myocardial infarction: Secondary | ICD-10-CM | POA: Insufficient documentation

## 2017-07-25 DIAGNOSIS — G894 Chronic pain syndrome: Secondary | ICD-10-CM

## 2017-07-25 DIAGNOSIS — F329 Major depressive disorder, single episode, unspecified: Secondary | ICD-10-CM | POA: Diagnosis not present

## 2017-07-25 DIAGNOSIS — Z79899 Other long term (current) drug therapy: Secondary | ICD-10-CM | POA: Diagnosis not present

## 2017-07-25 DIAGNOSIS — Z96642 Presence of left artificial hip joint: Secondary | ICD-10-CM | POA: Insufficient documentation

## 2017-07-25 DIAGNOSIS — G2581 Restless legs syndrome: Secondary | ICD-10-CM | POA: Diagnosis not present

## 2017-07-25 DIAGNOSIS — I10 Essential (primary) hypertension: Secondary | ICD-10-CM | POA: Insufficient documentation

## 2017-07-25 DIAGNOSIS — Z883 Allergy status to other anti-infective agents status: Secondary | ICD-10-CM | POA: Diagnosis not present

## 2017-07-25 DIAGNOSIS — D509 Iron deficiency anemia, unspecified: Secondary | ICD-10-CM | POA: Insufficient documentation

## 2017-07-25 DIAGNOSIS — F17210 Nicotine dependence, cigarettes, uncomplicated: Secondary | ICD-10-CM | POA: Insufficient documentation

## 2017-07-25 DIAGNOSIS — K219 Gastro-esophageal reflux disease without esophagitis: Secondary | ICD-10-CM | POA: Diagnosis not present

## 2017-07-25 DIAGNOSIS — G629 Polyneuropathy, unspecified: Secondary | ICD-10-CM | POA: Diagnosis not present

## 2017-07-25 DIAGNOSIS — Z9889 Other specified postprocedural states: Secondary | ICD-10-CM | POA: Diagnosis not present

## 2017-07-25 MED ORDER — TIZANIDINE HCL 4 MG PO TABS: 4.0000 mg | ORAL_TABLET | Freq: Three times a day (TID) | ORAL | 1 refills | Status: DC | PRN

## 2017-07-25 MED ORDER — AMITRIPTYLINE HCL 25 MG PO TABS: ORAL_TABLET | ORAL | 1 refills | Status: DC

## 2017-07-25 NOTE — Patient Instructions (Signed)
1.  Start amitriptyline 50 mg nightly.  If no side effects after 2 weeks can increase to 75 mill grams nightly. 2.  Tizanidine 4 mg 3 times daily as needed muscle spasms.  This medication may also help with sleep. 3.  Please discuss EMG studies/nerve conduction velocity tests with your neurologist to better understand your burning and tingling in her feet. 4.  Follow-up in 1 month.

## 2017-07-25 NOTE — Progress Notes (Signed)
Safety precautions to be maintained throughout the outpatient stay will include: orient to surroundings, keep bed in low position, maintain call bell within reach at all times, provide assistance with transfer out of bed and ambulation.  

## 2017-07-25 NOTE — Progress Notes (Signed)
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 venlafaxine, Effexor, alpha lipoic acid, compounded  ketamine cream.   Pharmacotherapy (current): Medications ordered:  Meds ordered this encounter  Medications  . amitriptyline (ELAVIL) 25 MG tablet    Sig: 50 mg nightly for 2 weeks, then 75 mg thereafter    Dispense:  90 tablet    Refill:  1  . tiZANidine (ZANAFLEX) 4 MG tablet    Sig: Take 1 tablet (4 mg total) by mouth every 8 (eight) hours as needed for muscle spasms.    Dispense:  90 tablet    Refill:  1   Medications administered during this visit: Pearline Cables. Kevin Alexander "Kevin Alexander" had no medications administered during this visit.   Provider-requested follow-up: Return in about 4 weeks (around 08/22/2017) for Medication Management.  Future Appointments  Date Time Provider [place]  08/20/2017 12:15 PM Gillis Santa, MD [facility] None  09/04/2017  1:30 PM Lucilla Lame, MD [facility] None    Primary Care Physician: Sallee Lange, NP Location: [facility] Note by: Gillis Santa, M.D, Date: 07/25/2017; Time: 8:19  AM  Patient Instructions  1.  Start amitriptyline 50 mg nightly.  If no side effects after 2 weeks can increase to 75 mill grams nightly. 2.  Tizanidine 4 mg 3 times daily as needed muscle spasms.  This medication may also help with sleep. 3.  Please discuss EMG studies/nerve conduction velocity tests with your neurologist to better understand your burning and tingling in her feet. 4.  Follow-up in 1 month.
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
## 2017-07-30 ENCOUNTER — Inpatient Hospital Stay: Payer: Medicare Other | Attending: Oncology

## 2017-07-30 DIAGNOSIS — D509 Iron deficiency anemia, unspecified: Secondary | ICD-10-CM | POA: Insufficient documentation

## 2017-07-30 DIAGNOSIS — D649 Anemia, unspecified: Secondary | ICD-10-CM

## 2017-07-30 LAB — CBC WITH DIFFERENTIAL/PLATELET
Basophils Absolute: 0 10*3/uL (ref 0–0.1)
Basophils Relative: 0 %
Eosinophils Absolute: 0.2 10*3/uL (ref 0–0.7)
Eosinophils Relative: 2 %
HCT: 42.4 % (ref 40.0–52.0)
Hemoglobin: 14.5 g/dL (ref 13.0–18.0)
Lymphocytes Relative: 24 %
Lymphs Abs: 2.3 10*3/uL (ref 1.0–3.6)
MCH: 31.3 pg (ref 26.0–34.0)
MCHC: 34.1 g/dL (ref 32.0–36.0)
MCV: 91.7 fL (ref 80.0–100.0)
Monocytes Absolute: 0.8 10*3/uL (ref 0.2–1.0)
Monocytes Relative: 9 %
Neutro Abs: 6.3 10*3/uL (ref 1.4–6.5)
Neutrophils Relative %: 65 %
Platelets: 294 10*3/uL (ref 150–440)
RBC: 4.62 MIL/uL (ref 4.40–5.90)
RDW: 13.7 % (ref 11.5–14.5)
WBC: 9.6 10*3/uL (ref 3.8–10.6)

## 2017-07-31 LAB — IRON AND TIBC
Iron: 42 ug/dL — ABNORMAL LOW (ref 45–182)
Saturation Ratios: 36 % (ref 17.9–39.5)
TIBC: 132 ug/dL — ABNORMAL LOW (ref 250–450)
UIBC: 76 ug/dL

## 2017-07-31 LAB — FERRITIN: Ferritin: 369 ng/mL — ABNORMAL HIGH (ref 24–336)

## 2017-08-01 ENCOUNTER — Ambulatory Visit: Payer: Medicare Other | Admitting: Student in an Organized Health Care Education/Training Program

## 2017-08-02 ENCOUNTER — Ambulatory Visit: Payer: Medicare Other | Admitting: Student in an Organized Health Care Education/Training Program

## 2017-08-02 ENCOUNTER — Inpatient Hospital Stay: Payer: Medicare Other

## 2017-08-02 ENCOUNTER — Inpatient Hospital Stay: Payer: Medicare Other | Admitting: Oncology

## 2017-08-03 ENCOUNTER — Telehealth: Payer: Self-pay | Admitting: Student in an Organized Health Care Education/Training Program

## 2017-08-03 NOTE — Telephone Encounter (Signed)
Spoke with Ms. Kevin Alexander, advised to have patient stop taking Amitriptyline. Advised to go to ED if continues to have concerns about mental status change.
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
## 2017-08-08 ENCOUNTER — Inpatient Hospital Stay: Payer: Medicare Other

## 2017-08-08 ENCOUNTER — Encounter: Payer: Self-pay | Admitting: Oncology

## 2017-08-08 ENCOUNTER — Inpatient Hospital Stay (HOSPITAL_BASED_OUTPATIENT_CLINIC_OR_DEPARTMENT_OTHER): Payer: Medicare Other | Admitting: Oncology

## 2017-08-08 VITALS — BP 137/90 | HR 90 | Temp 97.8°F | Resp 16 | Wt 124.0 lb

## 2017-08-08 DIAGNOSIS — D509 Iron deficiency anemia, unspecified: Secondary | ICD-10-CM

## 2017-08-08 DIAGNOSIS — D508 Other iron deficiency anemias: Secondary | ICD-10-CM

## 2017-08-09 ENCOUNTER — Telehealth: Payer: Self-pay | Admitting: *Deleted

## 2017-08-09 NOTE — Telephone Encounter (Signed)
Called into pharmacy Zanaflex 4mg  1-2 every 8 hours prn muscle spasms #180 with 1 refill per Dr. Holley Raring order- [place]

## 2017-08-09 NOTE — Telephone Encounter (Signed)
Patient called stating he has been taking Zanaflex 8mg  TID and it is helping with his spasms/pain. Wants to increase dose to 8mg  tid if ok with Dr. Holley Raring. I will ask Dr. Holley Raring and call patient back.  1240 Called patient back at the number he gave me and there was no answer  LVM for patient to return call. It is ok for patient to increase Zanaflex to 8mg  tid per Dr. Holley Raring. Patient needs to return call to give pharmacy and to make sure he understands instructions.

## 2017-08-20 ENCOUNTER — Ambulatory Visit
Payer: Medicare Other | Attending: Student in an Organized Health Care Education/Training Program | Admitting: Student in an Organized Health Care Education/Training Program

## 2017-08-20 ENCOUNTER — Other Ambulatory Visit: Payer: Self-pay

## 2017-08-20 ENCOUNTER — Encounter: Payer: Self-pay | Admitting: Student in an Organized Health Care Education/Training Program

## 2017-08-20 VITALS — BP 134/81 | HR 69 | Temp 98.7°F | Resp 18 | Ht 68.0 in | Wt 125.0 lb

## 2017-08-20 DIAGNOSIS — Z888 Allergy status to other drugs, medicaments and biological substances status: Secondary | ICD-10-CM | POA: Diagnosis not present

## 2017-08-20 DIAGNOSIS — E785 Hyperlipidemia, unspecified: Secondary | ICD-10-CM | POA: Insufficient documentation

## 2017-08-20 DIAGNOSIS — F329 Major depressive disorder, single episode, unspecified: Secondary | ICD-10-CM | POA: Insufficient documentation

## 2017-08-20 DIAGNOSIS — J342 Deviated nasal septum: Secondary | ICD-10-CM | POA: Insufficient documentation

## 2017-08-20 DIAGNOSIS — M79671 Pain in right foot: Secondary | ICD-10-CM | POA: Diagnosis not present

## 2017-08-20 DIAGNOSIS — I252 Old myocardial infarction: Secondary | ICD-10-CM | POA: Insufficient documentation

## 2017-08-20 DIAGNOSIS — Z79899 Other long term (current) drug therapy: Secondary | ICD-10-CM | POA: Diagnosis not present

## 2017-08-20 DIAGNOSIS — E871 Hypo-osmolality and hyponatremia: Secondary | ICD-10-CM | POA: Insufficient documentation

## 2017-08-20 DIAGNOSIS — Z969 Presence of functional implant, unspecified: Secondary | ICD-10-CM | POA: Diagnosis not present

## 2017-08-20 DIAGNOSIS — Z5181 Encounter for therapeutic drug level monitoring: Secondary | ICD-10-CM | POA: Insufficient documentation

## 2017-08-20 DIAGNOSIS — I10 Essential (primary) hypertension: Secondary | ICD-10-CM | POA: Diagnosis not present

## 2017-08-20 DIAGNOSIS — J449 Chronic obstructive pulmonary disease, unspecified: Secondary | ICD-10-CM | POA: Diagnosis not present

## 2017-08-20 DIAGNOSIS — G629 Polyneuropathy, unspecified: Secondary | ICD-10-CM | POA: Diagnosis not present

## 2017-08-20 DIAGNOSIS — M79672 Pain in left foot: Secondary | ICD-10-CM | POA: Diagnosis not present

## 2017-08-20 DIAGNOSIS — K219 Gastro-esophageal reflux disease without esophagitis: Secondary | ICD-10-CM | POA: Diagnosis not present

## 2017-08-20 DIAGNOSIS — E43 Unspecified severe protein-calorie malnutrition: Secondary | ICD-10-CM | POA: Insufficient documentation

## 2017-08-20 DIAGNOSIS — Z9889 Other specified postprocedural states: Secondary | ICD-10-CM | POA: Diagnosis not present

## 2017-08-20 DIAGNOSIS — Z883 Allergy status to other anti-infective agents status: Secondary | ICD-10-CM | POA: Insufficient documentation

## 2017-08-20 DIAGNOSIS — F17210 Nicotine dependence, cigarettes, uncomplicated: Secondary | ICD-10-CM | POA: Insufficient documentation

## 2017-08-20 DIAGNOSIS — G894 Chronic pain syndrome: Secondary | ICD-10-CM | POA: Diagnosis not present

## 2017-08-20 DIAGNOSIS — Z7982 Long term (current) use of aspirin: Secondary | ICD-10-CM | POA: Insufficient documentation

## 2017-08-20 DIAGNOSIS — D509 Iron deficiency anemia, unspecified: Secondary | ICD-10-CM | POA: Diagnosis not present

## 2017-08-20 DIAGNOSIS — G2581 Restless legs syndrome: Secondary | ICD-10-CM | POA: Diagnosis not present

## 2017-08-20 MED ORDER — TIZANIDINE HCL 4 MG PO TABS: 8.0000 mg | ORAL_TABLET | Freq: Three times a day (TID) | ORAL | 3 refills | Status: DC | PRN
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
## 2017-08-20 NOTE — Progress Notes (Signed)
Safety precautions to be maintained throughout the outpatient stay will include: orient to surroundings, keep bed in low position, maintain call bell within reach at all times, provide assistance with transfer out of bed and ambulation.  

## 2017-08-29 ENCOUNTER — Telehealth: Payer: Self-pay | Admitting: Student in an Organized Health Care Education/Training Program

## 2017-08-29 NOTE — Telephone Encounter (Signed)
Patient is having a few problems with Tizanidine. Please call to discuss

## 2017-08-29 NOTE — Telephone Encounter (Signed)
Patient states that Zanaflex is working good, but is having pounding feeling in his foot. Instructed that he would need to come in for a appointment to be checked. He could also she his PCP or go to the ER for any complications

## 2017-09-03 ENCOUNTER — Encounter: Payer: Self-pay | Admitting: Student in an Organized Health Care Education/Training Program

## 2017-09-03 ENCOUNTER — Ambulatory Visit
Payer: Medicare Other | Attending: Student in an Organized Health Care Education/Training Program | Admitting: Student in an Organized Health Care Education/Training Program

## 2017-09-03 VITALS — BP 137/78 | HR 73 | Temp 98.4°F | Resp 16 | Ht 68.0 in | Wt 125.0 lb

## 2017-09-03 DIAGNOSIS — Z7982 Long term (current) use of aspirin: Secondary | ICD-10-CM | POA: Diagnosis not present

## 2017-09-03 DIAGNOSIS — G894 Chronic pain syndrome: Secondary | ICD-10-CM | POA: Insufficient documentation

## 2017-09-03 DIAGNOSIS — M542 Cervicalgia: Secondary | ICD-10-CM | POA: Insufficient documentation

## 2017-09-03 DIAGNOSIS — S0990XA Unspecified injury of head, initial encounter: Secondary | ICD-10-CM | POA: Insufficient documentation

## 2017-09-03 DIAGNOSIS — Z969 Presence of functional implant, unspecified: Secondary | ICD-10-CM

## 2017-09-03 DIAGNOSIS — M79671 Pain in right foot: Secondary | ICD-10-CM

## 2017-09-03 DIAGNOSIS — M1610 Unilateral primary osteoarthritis, unspecified hip: Secondary | ICD-10-CM

## 2017-09-03 DIAGNOSIS — E871 Hypo-osmolality and hyponatremia: Secondary | ICD-10-CM | POA: Insufficient documentation

## 2017-09-03 DIAGNOSIS — G2581 Restless legs syndrome: Secondary | ICD-10-CM | POA: Insufficient documentation

## 2017-09-03 DIAGNOSIS — G629 Polyneuropathy, unspecified: Secondary | ICD-10-CM | POA: Diagnosis not present

## 2017-09-03 DIAGNOSIS — X58XXXA Exposure to other specified factors, initial encounter: Secondary | ICD-10-CM | POA: Diagnosis not present

## 2017-09-03 DIAGNOSIS — J449 Chronic obstructive pulmonary disease, unspecified: Secondary | ICD-10-CM | POA: Insufficient documentation

## 2017-09-03 DIAGNOSIS — Z883 Allergy status to other anti-infective agents status: Secondary | ICD-10-CM | POA: Diagnosis not present

## 2017-09-03 DIAGNOSIS — Z888 Allergy status to other drugs, medicaments and biological substances status: Secondary | ICD-10-CM | POA: Diagnosis not present

## 2017-09-03 DIAGNOSIS — E43 Unspecified severe protein-calorie malnutrition: Secondary | ICD-10-CM | POA: Insufficient documentation

## 2017-09-03 DIAGNOSIS — I252 Old myocardial infarction: Secondary | ICD-10-CM | POA: Diagnosis not present

## 2017-09-03 DIAGNOSIS — Z87442 Personal history of urinary calculi: Secondary | ICD-10-CM | POA: Insufficient documentation

## 2017-09-03 DIAGNOSIS — M79672 Pain in left foot: Secondary | ICD-10-CM | POA: Diagnosis not present

## 2017-09-03 DIAGNOSIS — I10 Essential (primary) hypertension: Secondary | ICD-10-CM | POA: Diagnosis not present

## 2017-09-03 DIAGNOSIS — F329 Major depressive disorder, single episode, unspecified: Secondary | ICD-10-CM | POA: Insufficient documentation

## 2017-09-03 DIAGNOSIS — Z9889 Other specified postprocedural states: Secondary | ICD-10-CM | POA: Insufficient documentation

## 2017-09-03 DIAGNOSIS — E785 Hyperlipidemia, unspecified: Secondary | ICD-10-CM | POA: Insufficient documentation

## 2017-09-03 DIAGNOSIS — F17210 Nicotine dependence, cigarettes, uncomplicated: Secondary | ICD-10-CM | POA: Insufficient documentation

## 2017-09-03 DIAGNOSIS — H9190 Unspecified hearing loss, unspecified ear: Secondary | ICD-10-CM | POA: Insufficient documentation

## 2017-09-03 DIAGNOSIS — J342 Deviated nasal septum: Secondary | ICD-10-CM | POA: Diagnosis not present

## 2017-09-03 DIAGNOSIS — D509 Iron deficiency anemia, unspecified: Secondary | ICD-10-CM | POA: Insufficient documentation

## 2017-09-03 DIAGNOSIS — R112 Nausea with vomiting, unspecified: Secondary | ICD-10-CM | POA: Diagnosis not present

## 2017-09-03 DIAGNOSIS — K219 Gastro-esophageal reflux disease without esophagitis: Secondary | ICD-10-CM | POA: Insufficient documentation

## 2017-09-03 DIAGNOSIS — I208 Other forms of angina pectoris: Secondary | ICD-10-CM | POA: Insufficient documentation

## 2017-09-03 DIAGNOSIS — Z79899 Other long term (current) drug therapy: Secondary | ICD-10-CM | POA: Diagnosis not present

## 2017-09-03 MED ORDER — ALPHA-LIPOIC ACID 600 MG PO CAPS: 600.0000 mg | ORAL_CAPSULE | Freq: Every day | ORAL | 2 refills | Status: DC

## 2017-09-03 NOTE — Progress Notes (Signed)
Safety precautions to be maintained throughout the outpatient stay will include: orient to surroundings, keep bed in low position, maintain call bell within reach at all times, provide assistance with transfer out of bed and ambulation.  
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
## 2017-09-04 ENCOUNTER — Other Ambulatory Visit: Payer: Self-pay

## 2017-09-04 ENCOUNTER — Ambulatory Visit (INDEPENDENT_AMBULATORY_CARE_PROVIDER_SITE_OTHER): Payer: Medicare Other | Admitting: Gastroenterology

## 2017-09-04 ENCOUNTER — Other Ambulatory Visit: Payer: Self-pay | Admitting: Gastroenterology

## 2017-09-04 ENCOUNTER — Encounter: Payer: Self-pay | Admitting: Gastroenterology

## 2017-09-04 DIAGNOSIS — R112 Nausea with vomiting, unspecified: Secondary | ICD-10-CM | POA: Diagnosis not present

## 2017-09-04 NOTE — Patient Instructions (Addendum)
You are scheduled for a RUQ abdominal US/HIDA scan at [facility] on April 2nd @ 11:00am. Please arrive and check in at the [facility] at 10:45am. You cannot have anything to eat or drink 6 hours prior to this scan.    If you need to reschedule this appointment for any reason, please contact central scheduling at [phone].

## 2017-09-04 NOTE — Progress Notes (Signed)
Primary Care Physician: Sallee Lange, NP  Primary Gastroenterologist:  Dr. Lucilla Lame  Chief Complaint  Patient presents with  . ER follow up  . Diarrhea  . Choking    HPI: Kevin Alexander is a 52 y.o. male here who was recently in emergency room for nausea and vomiting and diarrhea.  The patient states the diarrhea has completely resolved. He reports that he continues to have intermittent bouts of nausea.  The patient has had a history of candidal esophagitis. The patient also reports that his mother had similar symptoms before she was diagnosed with gallbladder disease.  The patient also states that he smokes marijuana regularly.  He thinks this may be helping his nausea.  Current Outpatient Medications  Medication Sig Dispense Refill  . Alpha-Lipoic Acid 600 MG CAPS Take 1 capsule (600 mg total) by mouth daily. 30 each 2  . aspirin EC 81 MG tablet Take 81 mg by mouth daily.     Marland Kitchen atorvastatin (LIPITOR) 40 MG tablet Take 40 mg by mouth every morning.     . carvedilol (COREG) 12.5 MG tablet Take 12.5 mg by mouth daily.    . DULoxetine (CYMBALTA) 60 MG capsule Take 60 mg by mouth daily.     . Ferrous Gluconate (IRON) 240 (27 Fe) MG TABS Take 240 mg by mouth daily.    . fexofenadine (ALLEGRA) 180 MG tablet Take 180 mg by mouth every morning.  0  . lisinopril (PRINIVIL,ZESTRIL) 40 MG tablet Take 40 mg by mouth daily.     . metoCLOPramide (REGLAN) 10 MG tablet Take 10 mg by mouth 3 (three) times daily.    Marland Kitchen omeprazole (PRILOSEC) 40 MG capsule Take 40 mg by mouth 2 (two) times daily.   3  . ondansetron (ZOFRAN) 4 MG tablet Take 4 mg by mouth 3 (three) times daily as needed for nausea or vomiting.     . promethazine (PHENERGAN) 25 MG tablet Take 1 tablet (25 mg total) by mouth every 4 (four) hours as needed for nausea or vomiting. 30 tablet 1  . sucralfate (CARAFATE) 1 g tablet Take 1 tablet (1 g total) by mouth 4 (four) times daily. 120 tablet 1  . tiZANidine (ZANAFLEX) 4 MG  tablet Take 2 tablets (8 mg total) by mouth every 8 (eight) hours as needed for muscle spasms. 180 tablet 3   No current facility-administered medications for this visit.     Allergies as of 09/04/2017 - Review Complete 09/04/2017  Allergen Reaction Noted  . Fluconazole Rash 09/22/2016  . Amitriptyline  08/20/2017  . Gabapentin Other (See Comments) 11/03/2016  . Pantoprazole Other (See Comments) 08/24/2015  . Amlodipine Itching and Rash 05/22/2013    ROS:  General: Negative for anorexia, weight loss, fever, chills, fatigue, weakness. ENT: Negative for hoarseness, difficulty swallowing , nasal congestion. CV: Negative for chest pain, angina, palpitations, dyspnea on exertion, peripheral edema.  Respiratory: Negative for dyspnea at rest, dyspnea on exertion, cough, sputum, wheezing.  GI: See history of present illness. GU:  Negative for dysuria, hematuria, urinary incontinence, urinary frequency, nocturnal urination.  Endo: Negative for unusual weight change.    Physical Examination:   BP (!) 158/87   Pulse 67   Temp (!) 97.3 F (36.3 C) (Oral)   Ht 5\' 7"  (1.702 m)   Wt 129 lb 9.6 oz (58.8 kg)   BMI 20.30 kg/m   General: Well-nourished, well-developed in no acute distress.  Eyes: No icterus. Conjunctivae pink. Mouth: Oropharyngeal mucosa  moist and pink , no lesions erythema or exudate. Lungs: Clear to auscultation bilaterally. Non-labored. Heart: Regular rate and rhythm, no murmurs rubs or gallops.  Abdomen: Bowel sounds are normal, nontender, nondistended, no hepatosplenomegaly or masses, no abdominal bruits or hernia , no rebound or guarding.   Extremities: No lower extremity edema. No clubbing or deformities. Neuro: Alert and oriented x 3.  Grossly intact. Skin: Warm and dry, no jaundice.   Psych: Alert and cooperative, normal mood and affect.  Labs:    Imaging Studies: No results found.  Assessment and Plan:   Kevin Alexander is a 52 y.o. y/o male with a  history of chronic intermittent nausea and vomiting.  The patient has gained weight recently and states he has been doing well.  The patient has been explained that marijuana can be associated with cyclic vomiting. The patient has also been set up for repeat EGD because of his history of esophageal candidiasis.  The patient will also have a right upper quadrant ultrasound with gallbladder emptying studies to see if there is a gallbladder cause of his nausea and vomiting.  The patient has been explained the plan and agrees with it.    Lucilla Lame, MD. Marval Regal   Note: This dictation was prepared with Dragon dictation along with smaller phrase technology. Any transcriptional errors that result from this process are unintentional.

## 2017-09-04 NOTE — H&P (View-Only) (Signed)
Primary Care Physician: Sallee Lange, NP  Primary Gastroenterologist:  Dr. Lucilla Lame  Chief Complaint  Patient presents with  . ER follow up  . Diarrhea  . Choking    HPI: Kevin Alexander is a 52 y.o. male here who was recently in emergency room for nausea and vomiting and diarrhea.  The patient states the diarrhea has completely resolved. He reports that he continues to have intermittent bouts of nausea.  The patient has had a history of candidal esophagitis. The patient also reports that his mother had similar symptoms before she was diagnosed with gallbladder disease.  The patient also states that he smokes marijuana regularly.  He thinks this may be helping his nausea.  Current Outpatient Medications  Medication Sig Dispense Refill  . Alpha-Lipoic Acid 600 MG CAPS Take 1 capsule (600 mg total) by mouth daily. 30 each 2  . aspirin EC 81 MG tablet Take 81 mg by mouth daily.     Marland Kitchen atorvastatin (LIPITOR) 40 MG tablet Take 40 mg by mouth every morning.     . carvedilol (COREG) 12.5 MG tablet Take 12.5 mg by mouth daily.    . DULoxetine (CYMBALTA) 60 MG capsule Take 60 mg by mouth daily.     . Ferrous Gluconate (IRON) 240 (27 Fe) MG TABS Take 240 mg by mouth daily.    . fexofenadine (ALLEGRA) 180 MG tablet Take 180 mg by mouth every morning.  0  . lisinopril (PRINIVIL,ZESTRIL) 40 MG tablet Take 40 mg by mouth daily.     . metoCLOPramide (REGLAN) 10 MG tablet Take 10 mg by mouth 3 (three) times daily.    Marland Kitchen omeprazole (PRILOSEC) 40 MG capsule Take 40 mg by mouth 2 (two) times daily.   3  . ondansetron (ZOFRAN) 4 MG tablet Take 4 mg by mouth 3 (three) times daily as needed for nausea or vomiting.     . promethazine (PHENERGAN) 25 MG tablet Take 1 tablet (25 mg total) by mouth every 4 (four) hours as needed for nausea or vomiting. 30 tablet 1  . sucralfate (CARAFATE) 1 g tablet Take 1 tablet (1 g total) by mouth 4 (four) times daily. 120 tablet 1  . tiZANidine (ZANAFLEX) 4 MG  tablet Take 2 tablets (8 mg total) by mouth every 8 (eight) hours as needed for muscle spasms. 180 tablet 3   No current facility-administered medications for this visit.     Allergies as of 09/04/2017 - Review Complete 09/04/2017  Allergen Reaction Noted  . Fluconazole Rash 09/22/2016  . Amitriptyline  08/20/2017  . Gabapentin Other (See Comments) 11/03/2016  . Pantoprazole Other (See Comments) 08/24/2015  . Amlodipine Itching and Rash 05/22/2013    ROS:  General: Negative for anorexia, weight loss, fever, chills, fatigue, weakness. ENT: Negative for hoarseness, difficulty swallowing , nasal congestion. CV: Negative for chest pain, angina, palpitations, dyspnea on exertion, peripheral edema.  Respiratory: Negative for dyspnea at rest, dyspnea on exertion, cough, sputum, wheezing.  GI: See history of present illness. GU:  Negative for dysuria, hematuria, urinary incontinence, urinary frequency, nocturnal urination.  Endo: Negative for unusual weight change.    Physical Examination:   BP (!) 158/87   Pulse 67   Temp (!) 97.3 F (36.3 C) (Oral)   Ht 5\' 7"  (1.702 m)   Wt 129 lb 9.6 oz (58.8 kg)   BMI 20.30 kg/m   General: Well-nourished, well-developed in no acute distress.  Eyes: No icterus. Conjunctivae pink. Mouth: Oropharyngeal mucosa  moist and pink , no lesions erythema or exudate. Lungs: Clear to auscultation bilaterally. Non-labored. Heart: Regular rate and rhythm, no murmurs rubs or gallops.  Abdomen: Bowel sounds are normal, nontender, nondistended, no hepatosplenomegaly or masses, no abdominal bruits or hernia , no rebound or guarding.   Extremities: No lower extremity edema. No clubbing or deformities. Neuro: Alert and oriented x 3.  Grossly intact. Skin: Warm and dry, no jaundice.   Psych: Alert and cooperative, normal mood and affect.  Labs:    Imaging Studies: No results found.  Assessment and Plan:   Kevin Alexander is a 52 y.o. y/o male with a  history of chronic intermittent nausea and vomiting.  The patient has gained weight recently and states he has been doing well.  The patient has been explained that marijuana can be associated with cyclic vomiting. The patient has also been set up for repeat EGD because of his history of esophageal candidiasis.  The patient will also have a right upper quadrant ultrasound with gallbladder emptying studies to see if there is a gallbladder cause of his nausea and vomiting.  The patient has been explained the plan and agrees with it.    Lucilla Lame, MD. Marval Regal   Note: This dictation was prepared with Dragon dictation along with smaller phrase technology. Any transcriptional errors that result from this process are unintentional.

## 2017-09-05 ENCOUNTER — Other Ambulatory Visit: Payer: Self-pay

## 2017-09-05 DIAGNOSIS — R112 Nausea with vomiting, unspecified: Secondary | ICD-10-CM

## 2017-09-06 NOTE — Discharge Instructions (Signed)
General Anesthesia, Adult, Care After °These instructions provide you with information about caring for yourself after your procedure. Your health care provider may also give you more specific instructions. Your treatment has been planned according to current medical practices, but problems sometimes occur. Call your health care provider if you have any problems or questions after your procedure. °What can I expect after the procedure? °After the procedure, it is common to have: °· Vomiting. °· A sore throat. °· Mental slowness. ° °It is common to feel: °· Nauseous. °· Cold or shivery. °· Sleepy. °· Tired. °· Sore or achy, even in parts of your body where you did not have surgery. ° °Follow these instructions at home: °For at least 24 hours after the procedure: °· Do not: °? Participate in activities where you could fall or become injured. °? Drive. °? Use heavy machinery. °? Drink alcohol. °? Take sleeping pills or medicines that cause drowsiness. °? Make important decisions or sign legal documents. °? Take care of children on your own. °· Rest. °Eating and drinking °· If you vomit, drink water, juice, or soup when you can drink without vomiting. °· Drink enough fluid to keep your urine clear or pale yellow. °· Make sure you have little or no nausea before eating solid foods. °· Follow the diet recommended by your health care provider. °General instructions °· Have a responsible adult stay with you until you are awake and alert. °· Return to your normal activities as told by your health care provider. Ask your health care provider what activities are safe for you. °· Take over-the-counter and prescription medicines only as told by your health care provider. °· If you smoke, do not smoke without supervision. °· Keep all follow-up visits as told by your health care provider. This is important. °Contact a health care provider if: °· You continue to have nausea or vomiting at home, and medicines are not helpful. °· You  cannot drink fluids or start eating again. °· You cannot urinate after 8-12 hours. °· You develop a skin rash. °· You have fever. °· You have increasing redness at the site of your procedure. °Get help right away if: °· You have difficulty breathing. °· You have chest pain. °· You have unexpected bleeding. °· You feel that you are having a life-threatening or urgent problem. °This information is not intended to replace advice given to you by your health care provider. Make sure you discuss any questions you have with your health care provider. °Document Released: 09/11/2000 Document Revised: 11/08/2015 Document Reviewed: 05/20/2015 °Elsevier Interactive Patient Education © 2018 [employer] ° °

## 2017-09-07 ENCOUNTER — Ambulatory Visit
Admission: RE | Admit: 2017-09-07 | Discharge: 2017-09-07 | Disposition: A | Discharge status: Home / Self Care | Payer: Medicare Other | Source: Ambulatory Visit | Attending: Gastroenterology | Admitting: Gastroenterology

## 2017-09-07 ENCOUNTER — Other Ambulatory Visit
Admission: RE | Admit: 2017-09-07 | Discharge: 2017-09-07 | Disposition: A | Discharge status: Home / Self Care | Payer: Medicare Other | Source: Ambulatory Visit | Attending: Gastroenterology | Admitting: Gastroenterology

## 2017-09-07 ENCOUNTER — Ambulatory Visit: Payer: Medicare Other | Admitting: Anesthesiology

## 2017-09-07 ENCOUNTER — Encounter
Admission: RE | Disposition: A | Discharge status: Home / Self Care | Payer: Self-pay | Source: Ambulatory Visit | Attending: Gastroenterology

## 2017-09-07 DIAGNOSIS — K222 Esophageal obstruction: Secondary | ICD-10-CM | POA: Diagnosis not present

## 2017-09-07 DIAGNOSIS — I10 Essential (primary) hypertension: Secondary | ICD-10-CM | POA: Diagnosis not present

## 2017-09-07 DIAGNOSIS — K2970 Gastritis, unspecified, without bleeding: Secondary | ICD-10-CM

## 2017-09-07 DIAGNOSIS — J449 Chronic obstructive pulmonary disease, unspecified: Secondary | ICD-10-CM | POA: Insufficient documentation

## 2017-09-07 DIAGNOSIS — Z888 Allergy status to other drugs, medicaments and biological substances status: Secondary | ICD-10-CM | POA: Diagnosis not present

## 2017-09-07 DIAGNOSIS — F1290 Cannabis use, unspecified, uncomplicated: Secondary | ICD-10-CM | POA: Insufficient documentation

## 2017-09-07 DIAGNOSIS — R1310 Dysphagia, unspecified: Secondary | ICD-10-CM | POA: Diagnosis not present

## 2017-09-07 DIAGNOSIS — E876 Hypokalemia: Secondary | ICD-10-CM | POA: Insufficient documentation

## 2017-09-07 DIAGNOSIS — K219 Gastro-esophageal reflux disease without esophagitis: Secondary | ICD-10-CM | POA: Diagnosis not present

## 2017-09-07 DIAGNOSIS — K449 Diaphragmatic hernia without obstruction or gangrene: Secondary | ICD-10-CM | POA: Insufficient documentation

## 2017-09-07 DIAGNOSIS — R112 Nausea with vomiting, unspecified: Secondary | ICD-10-CM | POA: Diagnosis not present

## 2017-09-07 DIAGNOSIS — Z79899 Other long term (current) drug therapy: Secondary | ICD-10-CM | POA: Diagnosis not present

## 2017-09-07 DIAGNOSIS — K2950 Unspecified chronic gastritis without bleeding: Secondary | ICD-10-CM | POA: Diagnosis not present

## 2017-09-07 DIAGNOSIS — F17200 Nicotine dependence, unspecified, uncomplicated: Secondary | ICD-10-CM | POA: Insufficient documentation

## 2017-09-07 DIAGNOSIS — Z7982 Long term (current) use of aspirin: Secondary | ICD-10-CM | POA: Diagnosis not present

## 2017-09-07 HISTORY — PX: ESOPHAGEAL DILATION: SHX303

## 2017-09-07 HISTORY — PX: ESOPHAGOGASTRODUODENOSCOPY (EGD) WITH PROPOFOL: SHX5813

## 2017-09-07 HISTORY — DX: Sleep apnea, unspecified: G47.30

## 2017-09-07 LAB — POTASSIUM: Potassium: 3 mmol/L — ABNORMAL LOW (ref 3.5–5.1)

## 2017-09-07 SURGERY — ESOPHAGOGASTRODUODENOSCOPY (EGD) WITH PROPOFOL
Anesthesia: General | Wound class: Clean Contaminated

## 2017-09-07 MED ORDER — LIDOCAINE HCL (CARDIAC) 20 MG/ML IV SOLN
INTRAVENOUS | Status: DC | PRN
Start: 2017-09-07 — End: 2017-09-07
  Administered 2017-09-07: 30 mg via INTRAVENOUS

## 2017-09-07 MED ORDER — PROPOFOL 10 MG/ML IV BOLUS
INTRAVENOUS | Status: DC | PRN
  Administered 2017-09-07: 50 mg via INTRAVENOUS
  Administered 2017-09-07: 40 mg via INTRAVENOUS
  Administered 2017-09-07: 100 mg via INTRAVENOUS

## 2017-09-07 MED ORDER — LACTATED RINGERS IV SOLN
INTRAVENOUS | Status: DC | PRN
  Administered 2017-09-07: 11:00:00 via INTRAVENOUS

## 2017-09-07 SURGICAL SUPPLY — 33 items
BALLN DILATOR 10-12 8 (BALLOONS)
BALLN DILATOR 12-15 8 (BALLOONS)
BALLN DILATOR 15-18 8 (BALLOONS) ×4
BALLN DILATOR CRE 0-12 8 (BALLOONS)
BALLN DILATOR ESOPH 8 10 CRE (MISCELLANEOUS) IMPLANT
BALLOON DILATOR 12-15 8 (BALLOONS) IMPLANT
BALLOON DILATOR 15-18 8 (BALLOONS) IMPLANT
BALLOON DILATOR CRE 0-12 8 (BALLOONS) IMPLANT
BLOCK BITE 60FR ADLT L/F GRN (MISCELLANEOUS) ×4 IMPLANT
CANISTER SUCT 1200ML W/VALVE (MISCELLANEOUS) ×4 IMPLANT
CLIP HMST 235XBRD CATH ROT (MISCELLANEOUS) IMPLANT
CLIP RESOLUTION 360 11X235 (MISCELLANEOUS)
ELECT REM PT RETURN 9FT ADLT (ELECTROSURGICAL)
ELECTRODE REM PT RTRN 9FT ADLT (ELECTROSURGICAL) IMPLANT
FCP ESCP3.2XJMB 240X2.8X (MISCELLANEOUS)
FORCEPS BIOP RAD 4 LRG CAP 4 (CUTTING FORCEPS) ×2 IMPLANT
FORCEPS BIOP RJ4 240 W/NDL (MISCELLANEOUS)
FORCEPS ESCP3.2XJMB 240X2.8X (MISCELLANEOUS) IMPLANT
GOWN CVR UNV OPN BCK APRN NK (MISCELLANEOUS) ×4 IMPLANT
GOWN ISOL THUMB LOOP REG UNIV (MISCELLANEOUS) ×8
INJECTOR VARIJECT VIN23 (MISCELLANEOUS) IMPLANT
KIT DEFENDO VALVE AND CONN (KITS) IMPLANT
KIT ENDO PROCEDURE OLY (KITS) ×4 IMPLANT
MARKER SPOT ENDO TATTOO 5ML (MISCELLANEOUS) IMPLANT
RETRIEVER NET PLAT FOOD (MISCELLANEOUS) IMPLANT
SNARE SHORT THROW 13M SML OVAL (MISCELLANEOUS) IMPLANT
SNARE SHORT THROW 30M LRG OVAL (MISCELLANEOUS) IMPLANT
SPOT EX ENDOSCOPIC TATTOO (MISCELLANEOUS)
SYR INFLATION 60ML (SYRINGE) IMPLANT
TRAP ETRAP POLY (MISCELLANEOUS) IMPLANT
VARIJECT INJECTOR VIN23 (MISCELLANEOUS)
WATER STERILE IRR 250ML POUR (IV SOLUTION) ×4 IMPLANT
WIRE CRE 18-20MM 8CM F G (MISCELLANEOUS) IMPLANT

## 2017-09-07 NOTE — Anesthesia Procedure Notes (Signed)
Date/Time: 09/07/2017 11:01 AM Performed by: Cameron Ali, CRNA Pre-anesthesia Checklist: Patient identified, Emergency Drugs available, Suction available, Timeout performed and Patient being monitored Patient Re-evaluated:Patient Re-evaluated prior to induction Oxygen Delivery Method: Nasal cannula Placement Confirmation: positive ETCO2

## 2017-09-07 NOTE — Op Note (Signed)
[facility]astroenterology Patient Name: Kevin Alexander Procedure Date: 09/07/2017 10:57 AM MRN: [id number] Account #: [id number] Date of Birth: 1965/11/06 Admit Type: Outpatient Age: 52 Room: [facility] OR ROOM 01 Gender: Male Note Status: Finalized Procedure:            Upper GI endoscopy Indications:          Dysphagia Providers:            Lucilla Lame MD, MD Referring MD:         Juluis Rainier (Referring MD) Medicines:            Propofol per Anesthesia Complications:        No immediate complications. Procedure:            Pre-Anesthesia Assessment:                       - Prior to the procedure, a History and Physical was                        performed, and patient medications and allergies were                        reviewed. The patient's tolerance of previous                        anesthesia was also reviewed. The risks and benefits of                        the procedure and the sedation options and risks were                        discussed with the patient. All questions were                        answered, and informed consent was obtained. Prior                        Anticoagulants: The patient has taken no previous                        anticoagulant or antiplatelet agents. ASA Grade                        Assessment: II - A patient with mild systemic disease.                        After reviewing the risks and benefits, the patient was                        deemed in satisfactory condition to undergo the                        procedure.                       After obtaining informed consent, the endoscope was                        passed under direct vision. Throughout the procedure,  the patient's blood pressure, pulse, and oxygen                        saturations were monitored continuously. The Olympus                        [id number]-[id number] Endoscope (S#. [id number]) was introduced                        through the  mouth, and advanced to the second part of                        duodenum. The upper GI endoscopy was accomplished                        without difficulty. The patient tolerated the procedure                        well. Findings:      One moderate benign-appearing, intrinsic stenosis was found in the upper       third of the esophagus. And was traversed. A TTS dilator was passed       through the scope. Dilation with a 15-16.5-18 mm balloon dilator was       performed to 16.5 mm. The dilation site was examined following endoscope       reinsertion and showed complete resolution of luminal narrowing.      One mild benign-appearing, intrinsic stenosis was found at the       gastroesophageal junction. And was traversed. A TTS dilator was passed       through the scope. Dilation with a 15-16.5-18 mm balloon dilator was       performed to 18 mm.      A small hiatal hernia was present.      Localized moderate inflammation characterized by erythema was found in       the gastric antrum. Biopsies were taken with a cold forceps for       histology.      The examined duodenum was normal. Impression:           - Benign-appearing esophageal stenosis. Dilated.                       - Benign-appearing esophageal stenosis. Dilated.                       - Small hiatal hernia.                       - Gastritis. Biopsied.                       - Normal examined duodenum. Recommendation:       - Discharge patient to home.                       - Resume previous diet.                       - Continue present medications.                       - Await pathology results. Procedure Code(s):    --- Professional ---  [phone], Esophagogastroduodenoscopy, flexible, transoral;                        with transendoscopic balloon dilation of esophagus                        (less than 30 mm diameter)                       [id number], Esophagogastroduodenoscopy, flexible, transoral;                         with biopsy, single or multiple Diagnosis Code(s):    --- Professional ---                       R13.10, Dysphagia, unspecified                       K22.2, Esophageal obstruction                       K44.9, Diaphragmatic hernia without obstruction or                        gangrene                       K29.70, Gastritis, unspecified, without bleeding CPT copyright 2016 American Medical Association. All rights reserved. The codes documented in this report are preliminary and upon coder review may  be revised to meet current compliance requirements. Lucilla Lame MD, MD 09/07/2017 11:14:37 AM This report has been signed electronically. Number of Addenda: 0 Note Initiated On: 09/07/2017 10:57 AM Total Procedure Duration: 0 hours 5 minutes 30 seconds       [facility]

## 2017-09-07 NOTE — Transfer of Care (Signed)
Immediate Anesthesia Transfer of Care Note  Patient: Kevin Alexander  Procedure(s) Performed: ESOPHAGOGASTRODUODENOSCOPY (EGD) WITH PROPOFOL (N/A )  Patient Location: PACU  Anesthesia Type: General  Level of Consciousness: awake, alert  and patient cooperative  Airway and Oxygen Therapy: Patient Spontanous Breathing and Patient connected to supplemental oxygen  Post-op Assessment: Post-op Vital signs reviewed, Patient's Cardiovascular Status Stable, Respiratory Function Stable, Patent Airway and No signs of Nausea or vomiting  Post-op Vital Signs: Reviewed and stable  Complications: No apparent anesthesia complications

## 2017-09-07 NOTE — Anesthesia Postprocedure Evaluation (Signed)
Anesthesia Post Note  Patient: Kevin Alexander  Procedure(s) Performed: ESOPHAGOGASTRODUODENOSCOPY (EGD) WITH PROPOFOL (N/A ) ESOPHAGEAL DILATION  Patient location during evaluation: PACU Anesthesia Type: General Level of consciousness: awake and alert and oriented Pain management: satisfactory to patient Vital Signs Assessment: post-procedure vital signs reviewed and stable Respiratory status: spontaneous breathing, nonlabored ventilation and respiratory function stable Cardiovascular status: blood pressure returned to baseline and stable Postop Assessment: Adequate PO intake and No signs of nausea or vomiting Anesthetic complications: no    Raliegh Ip

## 2017-09-07 NOTE — Anesthesia Preprocedure Evaluation (Signed)
Anesthesia Evaluation  Patient identified by MRN, date of birth, ID band Patient awake    Reviewed: Allergy & Precautions, H&P , NPO status , Patient's Chart, lab work & pertinent test results  Airway Mallampati: II  TM Distance: >3 FB Neck ROM: full    Dental  (+) Edentulous Upper, Edentulous Lower   Pulmonary shortness of breath and with exertion, COPD, Current Smoker,    Pulmonary exam normal breath sounds clear to auscultation       Cardiovascular hypertension, Normal cardiovascular exam Rhythm:regular Rate:Normal     Neuro/Psych PSYCHIATRIC DISORDERS    GI/Hepatic GERD  ,  Endo/Other    Renal/GU      Musculoskeletal   Abdominal   Peds  Hematology   Anesthesia Other Findings Chronic hypokalemia  Reproductive/Obstetrics                             Anesthesia Physical Anesthesia Plan  ASA: III  Anesthesia Plan: General   Post-op Pain Management:    Induction: Intravenous  PONV Risk Score and Plan: 2 and Propofol infusion and Treatment may vary due to age or medical condition  Airway Management Planned: Natural Airway  Additional Equipment:   Intra-op Plan:   Post-operative Plan:   Informed Consent: I have reviewed the patients History and Physical, chart, labs and discussed the procedure including the risks, benefits and alternatives for the proposed anesthesia with the patient or authorized representative who has indicated his/her understanding and acceptance.     Plan Discussed with: CRNA  Anesthesia Plan Comments:         Anesthesia Quick Evaluation

## 2017-09-07 NOTE — Interval H&P Note (Signed)
History and Physical Interval Note:  09/07/2017 10:53 AM  Kevin Alexander  has presented today for surgery, with the diagnosis of Nausea and vomiting R11.2  The various methods of treatment have been discussed with the patient and family. After consideration of risks, benefits and other options for treatment, the patient has consented to  Procedure(s): ESOPHAGOGASTRODUODENOSCOPY (EGD) WITH PROPOFOL (N/A) as a surgical intervention .  The patient's history has been reviewed, patient examined, no change in status, stable for surgery.  I have reviewed the patient's chart and labs.  Questions were answered to the patient's satisfaction.     [name] [employer]

## 2017-09-10 ENCOUNTER — Encounter: Payer: Self-pay | Admitting: Gastroenterology

## 2017-09-12 ENCOUNTER — Encounter: Payer: Self-pay | Admitting: Gastroenterology

## 2017-09-14 ENCOUNTER — Encounter: Payer: Self-pay | Admitting: Gastroenterology

## 2017-09-15 IMAGING — RF DG ESOPHAGUS
7 of 9 series · 14 of 22 positions shown · non-contrast
Comparison: Acute abdominal series of July 13, 2015

CLINICAL DATA: Decreased appetite for the past 4 weeks since a
fall. The patient O awakened from the fall having vomited. The
patient has had follow up episodes. Patient has experienced 20 pound
weight loss over the past month. Patient reports dysphagia with food
becoming stuck. The patient also reports vomiting of undigested food
eaten yesterday.

EXAM:
ESOPHOGRAM / BARIUM SWALLOW / BARIUM TABLET STUDY
TECHNIQUE: Combined double contrast and single contrast examination performed
using effervescent crystals, thick barium liquid, and thin barium
liquid. The patient was observed with fluoroscopy swallowing a 13 mm
barium sulphate tablet.
FLUOROSCOPY TIME:  Fluoroscopy Time:  1 minutes, 36 seconds
Radiation Exposure Index (if provided by the fluoroscopic device):
7258 micro Gy per meters squared
Number of Acquired Spot Images: 5 + 4 video loops.

[Series 1: fluoro_barium 2fps_bw · 0.18mm/px · 3 of 10 frames shown (1 of 7)]
[frame 2/10]
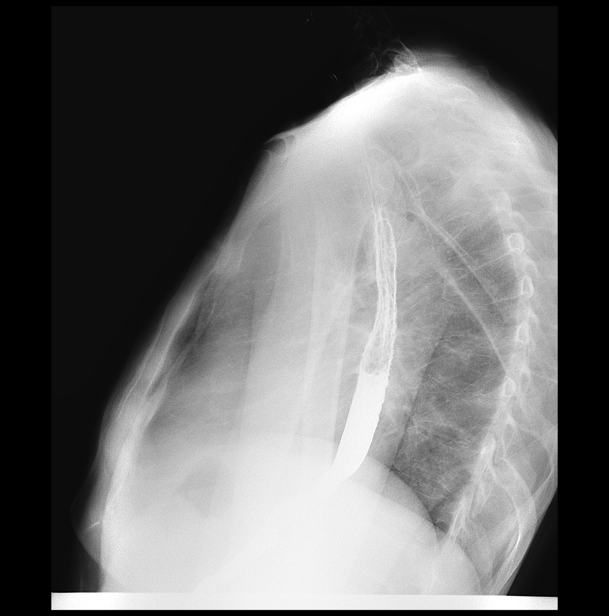
[frame 9/10]
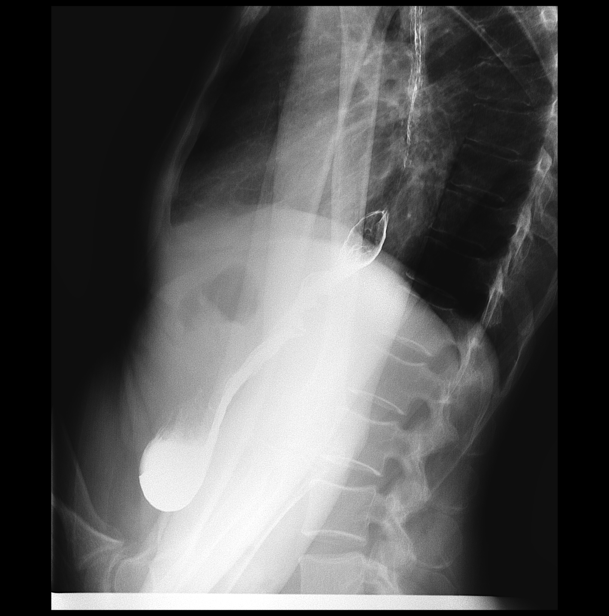
[frame 10/10]
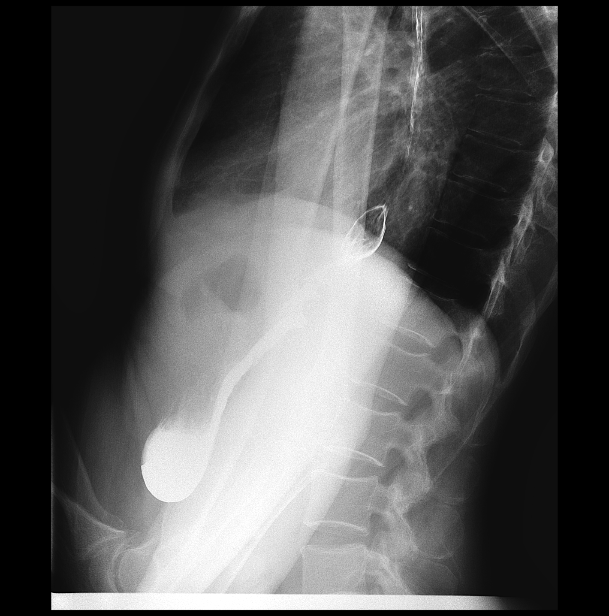

[Series 3: fluoro_barium 2fps_bw · 0.18mm/px · 3 of 11 frames shown (2 of 7)]
[frame 2/11]
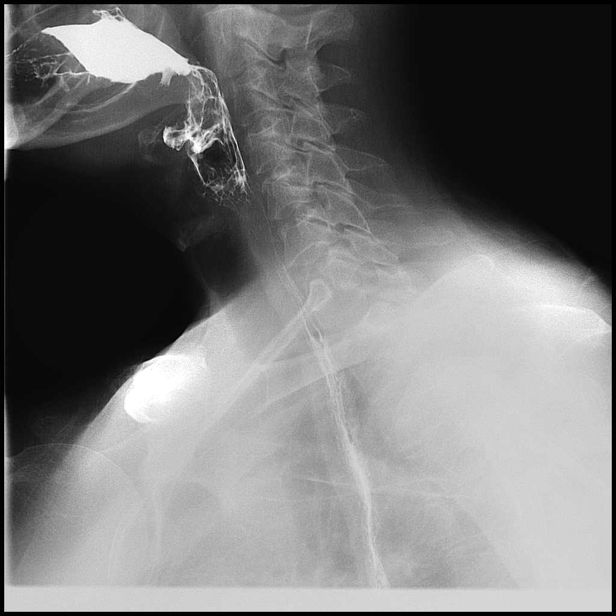
[frame 6/11]
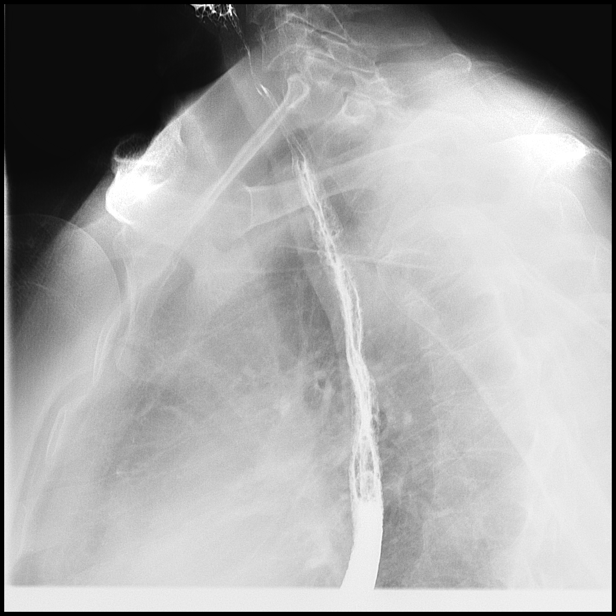
[frame 10/11]
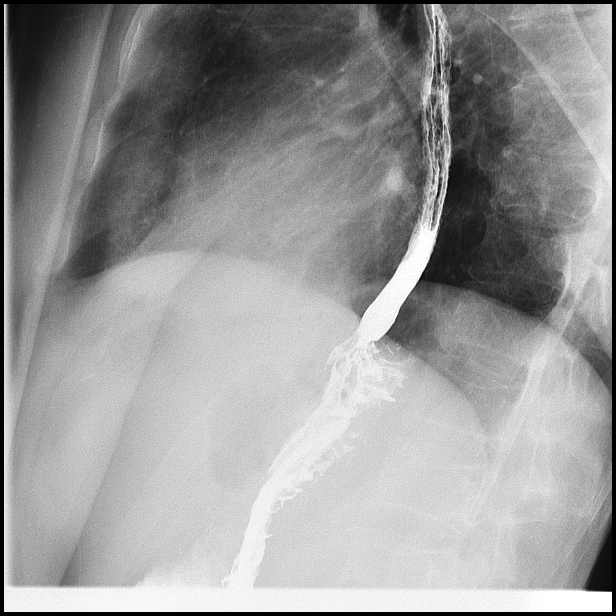

[Series 4: fluoro_barium 2fps_bw · 0.18mm/px · 2 of 20 frames shown (3 of 7)]
[frame 4/20]
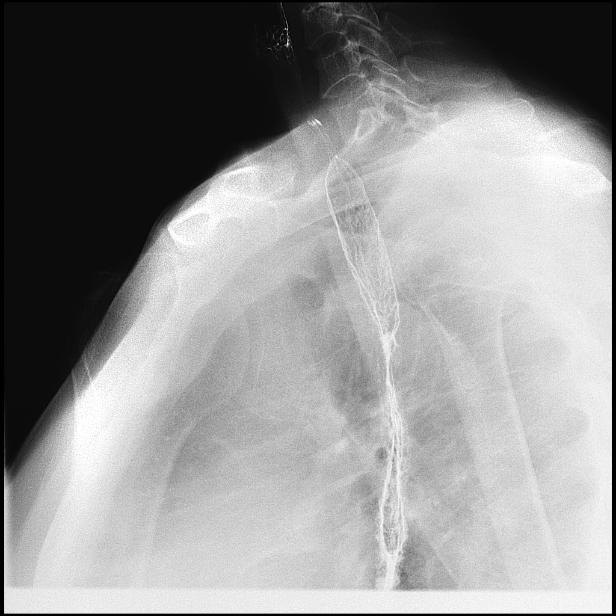
[frame 11/20]
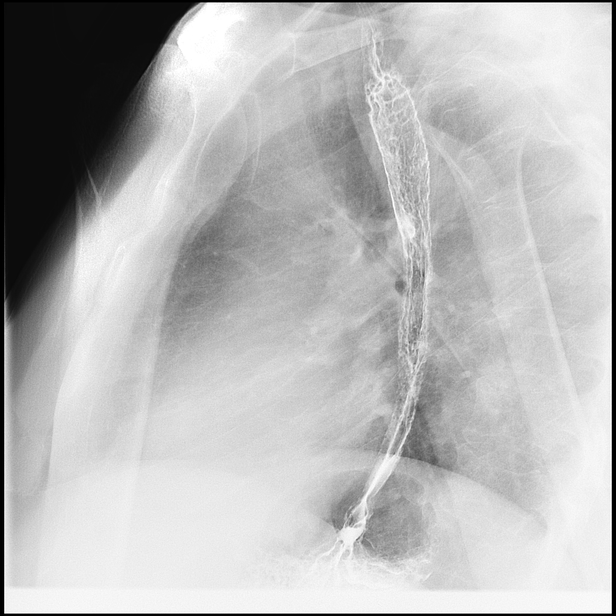

[Series 5: fluoro_barium 2fps_bw · 0.18mm/px · 3 of 15 frames shown (4 of 7)]
[frame 1/15]
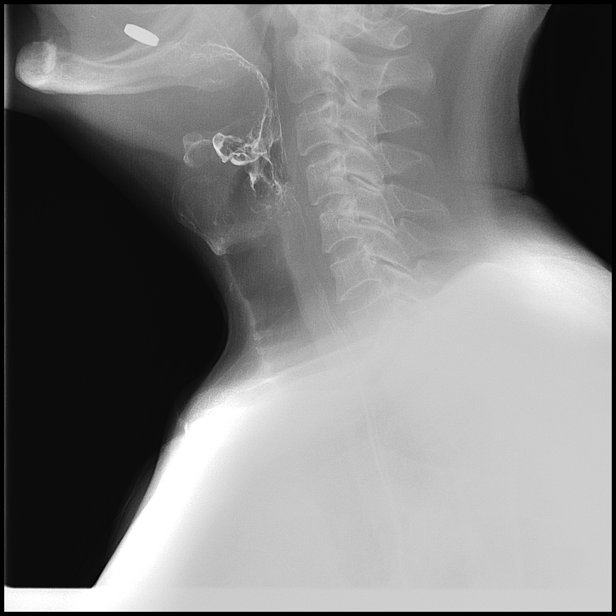
[frame 3/15]
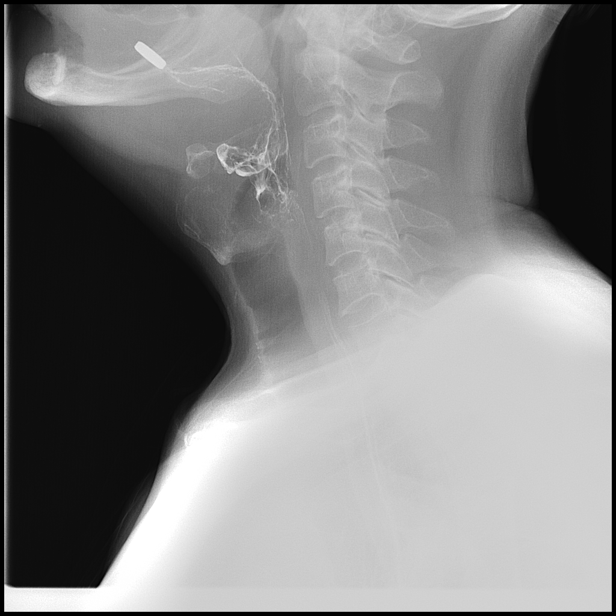
[frame 13/15]
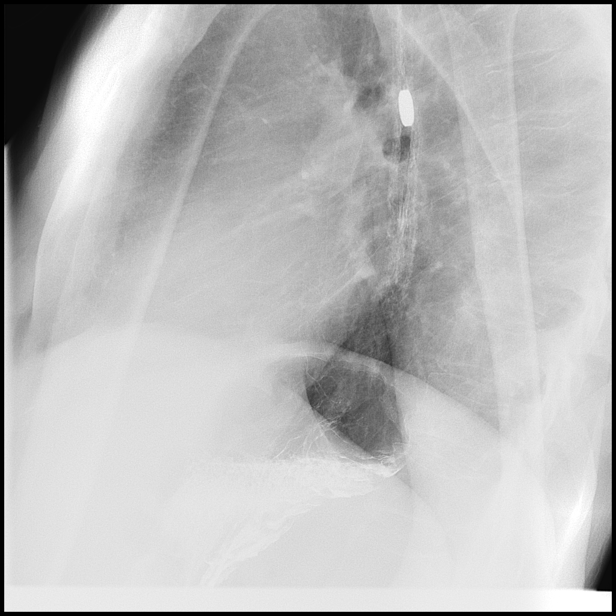

[Series 7: fluoro_barium 2fps_bw · 0.18mm/px · 1 of 1 slices shown (5 of 7)]
[im 1/1]
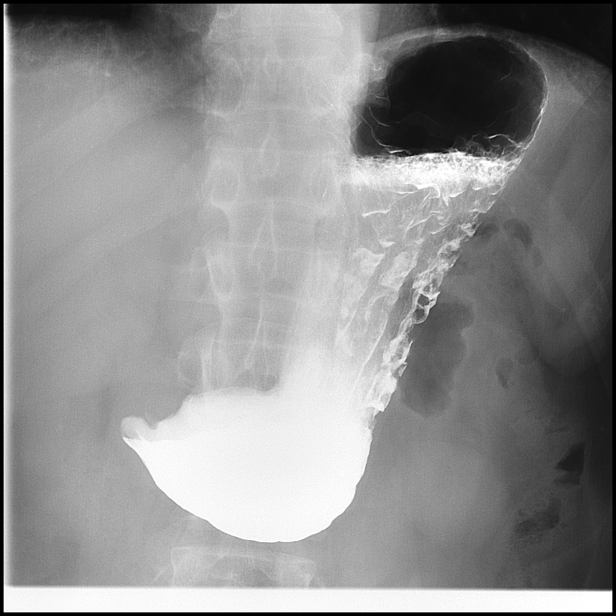

[Series 8: fluoro_barium 2fps_bw · 0.18mm/px · 1 of 2 frames shown (6 of 7)]
[frame 1/2]
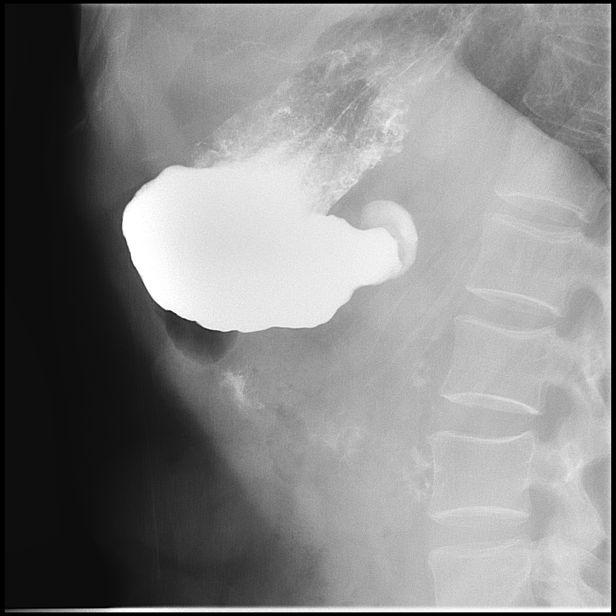

[Series 9: fluoro_barium 2fps_bw · 0.18mm/px · 1 of 1 slices shown (7 of 7)]
[im 1/1]
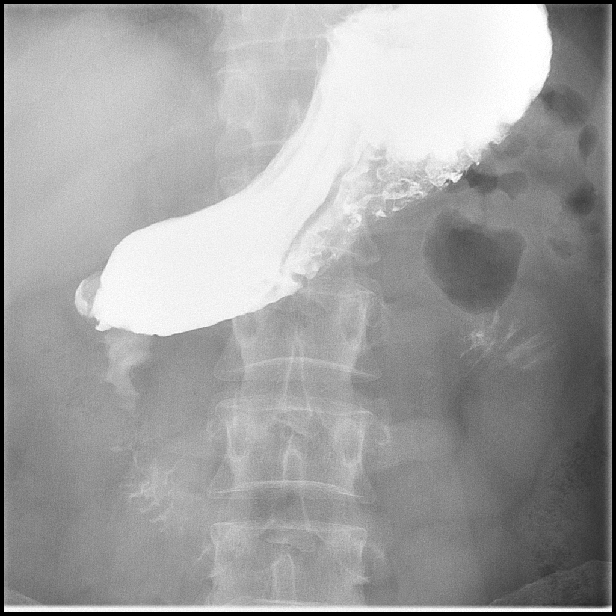

[14 of 22 positions shown; findings below may reference images not displayed]

FINDINGS: The study is limited due to the patient's incipient nausea. The
study was performed using cine loops as well as a few static images.
Both thick and thin barium with gas-forming crystals were employed
as well as a barium tablet.

The cervical esophagus distended well. There was no laryngeal
penetration of the barium. The thoracic esophagus distended
reasonably well but the mucosal surface was irregular and the fold
thickened folds thickened. No discrete ulcer was observed. The
barium tablet hung in the mid esophagus but there was no obstruction
to passage of liquid barium. The barium tablet did pass with further
administration of water and liquid barium. Limited visualization of
the stomach reveal the presence of a moderate amount of fluid with a
small amount of administered barium. Gastric emptying was quite
delayed.
IMPRESSION: Abnormal appearance of the thoracic esophageal mucosa worrisome for
esophagitis. The esophagus is itself was not quite is distensible as
would be liked. The barium tablet hung transiently in the mid
esophagus but then passed into the stomach. No high-grade esophageal
stricture was observed.

Subjective delayed emptying of the stomach. Given the reported
vomiting of undigested food hours after ingestion may also indicate
gastric outlet obstruction.

Upper endoscopy to evaluate the esophagus and stomach would be
useful. A nuclear medicine gastric emptying study may also be needed
to further assess the stomach.

## 2017-09-18 ENCOUNTER — Ambulatory Visit
Admission: RE | Admit: 2017-09-18 | Discharge: 2017-09-18 | Disposition: A | Discharge status: Home / Self Care | Payer: Medicare Other | Source: Ambulatory Visit | Attending: Gastroenterology | Admitting: Gastroenterology

## 2017-09-18 ENCOUNTER — Ambulatory Visit: Payer: Medicare Other

## 2017-09-18 DIAGNOSIS — K760 Fatty (change of) liver, not elsewhere classified: Secondary | ICD-10-CM | POA: Insufficient documentation

## 2017-09-18 DIAGNOSIS — R112 Nausea with vomiting, unspecified: Secondary | ICD-10-CM | POA: Diagnosis present

## 2017-09-20 ENCOUNTER — Telehealth: Payer: Self-pay

## 2017-09-20 ENCOUNTER — Other Ambulatory Visit: Payer: Self-pay

## 2017-09-20 DIAGNOSIS — R112 Nausea with vomiting, unspecified: Secondary | ICD-10-CM

## 2017-09-20 NOTE — Telephone Encounter (Signed)
Pt advised of [place] results. Asked pt about HIDA scan as it wasn't done. Pt stated he wasn't aware of the appt. Rescheduled appt to Wednesday, April 17th @ 11:00am. He is to arrive at 10:30am, be NPO 6 hours prior and to not take opiate medication 6 hours prior as well.  Pt's friend, Marcie Bal was advised of this appt information.

## 2017-09-20 NOTE — Telephone Encounter (Signed)
LVM for pt to return my call.

## 2017-09-20 NOTE — Telephone Encounter (Signed)
-----   Message from Lucilla Lame, MD sent at 09/19/2017  8:01 AM EDT ----- The patient know that his ultrasound showed fatty liver but no other abnormalities.  We are waiting for the gallbladder emptying study results.

## 2017-10-03 ENCOUNTER — Encounter
Admission: RE | Admit: 2017-10-03 | Discharge: 2017-10-03 | Disposition: A | Discharge status: Home / Self Care | Payer: Medicare Other | Source: Ambulatory Visit | Attending: Gastroenterology | Admitting: Gastroenterology

## 2017-10-03 DIAGNOSIS — R112 Nausea with vomiting, unspecified: Secondary | ICD-10-CM

## 2017-10-03 MED ORDER — TECHNETIUM TC 99M MEBROFENIN IV KIT
5.4500 | PACK | Freq: Once | INTRAVENOUS | Status: AC | PRN
  Administered 2017-10-03: 5.45 via INTRAVENOUS

## 2017-10-15 ENCOUNTER — Encounter: Payer: Self-pay | Admitting: Student in an Organized Health Care Education/Training Program

## 2017-10-15 ENCOUNTER — Ambulatory Visit
Payer: Medicare Other | Attending: Student in an Organized Health Care Education/Training Program | Admitting: Student in an Organized Health Care Education/Training Program

## 2017-10-15 VITALS — BP 151/81 | HR 69 | Temp 98.4°F | Resp 18 | Ht 67.0 in | Wt 125.0 lb

## 2017-10-15 DIAGNOSIS — G2581 Restless legs syndrome: Secondary | ICD-10-CM | POA: Diagnosis not present

## 2017-10-15 DIAGNOSIS — G4730 Sleep apnea, unspecified: Secondary | ICD-10-CM | POA: Diagnosis not present

## 2017-10-15 DIAGNOSIS — Z681 Body mass index (BMI) 19 or less, adult: Secondary | ICD-10-CM | POA: Diagnosis not present

## 2017-10-15 DIAGNOSIS — H9190 Unspecified hearing loss, unspecified ear: Secondary | ICD-10-CM | POA: Diagnosis not present

## 2017-10-15 DIAGNOSIS — Z9889 Other specified postprocedural states: Secondary | ICD-10-CM

## 2017-10-15 DIAGNOSIS — F329 Major depressive disorder, single episode, unspecified: Secondary | ICD-10-CM | POA: Diagnosis not present

## 2017-10-15 DIAGNOSIS — J449 Chronic obstructive pulmonary disease, unspecified: Secondary | ICD-10-CM | POA: Diagnosis not present

## 2017-10-15 DIAGNOSIS — J342 Deviated nasal septum: Secondary | ICD-10-CM | POA: Insufficient documentation

## 2017-10-15 DIAGNOSIS — Z883 Allergy status to other anti-infective agents status: Secondary | ICD-10-CM | POA: Insufficient documentation

## 2017-10-15 DIAGNOSIS — M79672 Pain in left foot: Secondary | ICD-10-CM | POA: Diagnosis not present

## 2017-10-15 DIAGNOSIS — M79671 Pain in right foot: Secondary | ICD-10-CM | POA: Insufficient documentation

## 2017-10-15 DIAGNOSIS — Z7982 Long term (current) use of aspirin: Secondary | ICD-10-CM | POA: Diagnosis not present

## 2017-10-15 DIAGNOSIS — Z8711 Personal history of peptic ulcer disease: Secondary | ICD-10-CM | POA: Diagnosis not present

## 2017-10-15 DIAGNOSIS — E43 Unspecified severe protein-calorie malnutrition: Secondary | ICD-10-CM | POA: Diagnosis not present

## 2017-10-15 DIAGNOSIS — E785 Hyperlipidemia, unspecified: Secondary | ICD-10-CM | POA: Insufficient documentation

## 2017-10-15 DIAGNOSIS — M1610 Unilateral primary osteoarthritis, unspecified hip: Secondary | ICD-10-CM | POA: Diagnosis not present

## 2017-10-15 DIAGNOSIS — K219 Gastro-esophageal reflux disease without esophagitis: Secondary | ICD-10-CM | POA: Diagnosis not present

## 2017-10-15 DIAGNOSIS — Z888 Allergy status to other drugs, medicaments and biological substances status: Secondary | ICD-10-CM | POA: Insufficient documentation

## 2017-10-15 DIAGNOSIS — I10 Essential (primary) hypertension: Secondary | ICD-10-CM | POA: Diagnosis not present

## 2017-10-15 DIAGNOSIS — M47816 Spondylosis without myelopathy or radiculopathy, lumbar region: Secondary | ICD-10-CM | POA: Insufficient documentation

## 2017-10-15 DIAGNOSIS — G894 Chronic pain syndrome: Secondary | ICD-10-CM | POA: Diagnosis not present

## 2017-10-15 DIAGNOSIS — Z79899 Other long term (current) drug therapy: Secondary | ICD-10-CM | POA: Insufficient documentation

## 2017-10-15 DIAGNOSIS — E871 Hypo-osmolality and hyponatremia: Secondary | ICD-10-CM | POA: Insufficient documentation

## 2017-10-15 DIAGNOSIS — Z77090 Contact with and (suspected) exposure to asbestos: Secondary | ICD-10-CM | POA: Diagnosis not present

## 2017-10-15 DIAGNOSIS — Z969 Presence of functional implant, unspecified: Secondary | ICD-10-CM

## 2017-10-15 DIAGNOSIS — D509 Iron deficiency anemia, unspecified: Secondary | ICD-10-CM | POA: Diagnosis not present

## 2017-10-15 DIAGNOSIS — I252 Old myocardial infarction: Secondary | ICD-10-CM | POA: Insufficient documentation

## 2017-10-15 DIAGNOSIS — Z87442 Personal history of urinary calculi: Secondary | ICD-10-CM | POA: Diagnosis not present

## 2017-10-15 DIAGNOSIS — K222 Esophageal obstruction: Secondary | ICD-10-CM | POA: Insufficient documentation

## 2017-10-15 DIAGNOSIS — F17210 Nicotine dependence, cigarettes, uncomplicated: Secondary | ICD-10-CM | POA: Insufficient documentation

## 2017-10-15 DIAGNOSIS — G629 Polyneuropathy, unspecified: Secondary | ICD-10-CM | POA: Diagnosis not present

## 2017-10-15 DIAGNOSIS — M542 Cervicalgia: Secondary | ICD-10-CM | POA: Insufficient documentation

## 2017-10-15 MED ORDER — AMITRIPTYLINE HCL 25 MG PO TABS: 25.0000 mg | ORAL_TABLET | Freq: Every day | ORAL | 1 refills | Status: DC

## 2017-10-15 NOTE — Progress Notes (Signed)
Safety precautions to be maintained throughout the outpatient stay will include: orient to surroundings, keep bed in low position, maintain call bell within reach at all times, provide assistance with transfer out of bed and ambulation.  
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
## 2017-10-15 NOTE — Patient Instructions (Signed)
You were given one prescription for Amitriptyline today.

## 2017-10-16 ENCOUNTER — Telehealth: Payer: Self-pay

## 2017-10-16 NOTE — Telephone Encounter (Signed)
-----   Message from Lucilla Lame, MD sent at 10/04/2017  7:49 AM EDT ----- Let the patient know that his gallbladder emptying study showed his gallbladder to the empty normally.

## 2017-10-16 NOTE — Telephone Encounter (Signed)
Pt notified of HIDA scan results.

## 2017-11-26 ENCOUNTER — Encounter: Payer: Self-pay | Admitting: Nurse Practitioner

## 2017-11-26 ENCOUNTER — Ambulatory Visit
Payer: Medicare Other | Attending: Student in an Organized Health Care Education/Training Program | Admitting: Nurse Practitioner

## 2017-11-26 ENCOUNTER — Other Ambulatory Visit: Payer: Self-pay

## 2017-11-26 VITALS — BP 128/81 | HR 70 | Temp 98.3°F | Resp 18 | Ht 67.0 in | Wt 125.0 lb

## 2017-11-26 DIAGNOSIS — M4726 Other spondylosis with radiculopathy, lumbar region: Secondary | ICD-10-CM

## 2017-11-26 DIAGNOSIS — G608 Other hereditary and idiopathic neuropathies: Secondary | ICD-10-CM | POA: Diagnosis not present

## 2017-11-26 DIAGNOSIS — R200 Anesthesia of skin: Secondary | ICD-10-CM | POA: Diagnosis not present

## 2017-11-26 DIAGNOSIS — G894 Chronic pain syndrome: Secondary | ICD-10-CM | POA: Diagnosis not present

## 2017-11-26 DIAGNOSIS — G629 Polyneuropathy, unspecified: Secondary | ICD-10-CM

## 2017-11-26 DIAGNOSIS — R202 Paresthesia of skin: Secondary | ICD-10-CM | POA: Diagnosis not present

## 2017-11-26 MED ORDER — PREGABALIN 25 MG PO CAPS: 25.0000 mg | ORAL_CAPSULE | Freq: Three times a day (TID) | ORAL | 0 refills | Status: DC

## 2017-11-26 MED ORDER — TIZANIDINE HCL 4 MG PO TABS: 8.0000 mg | ORAL_TABLET | Freq: Three times a day (TID) | ORAL | 3 refills | Status: AC | PRN
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
## 2017-11-26 NOTE — Progress Notes (Signed)
Safety precautions to be maintained throughout the outpatient stay will include: orient to surroundings, keep bed in low position, maintain call bell within reach at all times, provide assistance with transfer out of bed and ambulation.  

## 2017-12-25 ENCOUNTER — Ambulatory Visit
Payer: Medicare Other | Attending: Student in an Organized Health Care Education/Training Program | Admitting: Student in an Organized Health Care Education/Training Program

## 2017-12-25 ENCOUNTER — Encounter: Payer: Self-pay | Admitting: Student in an Organized Health Care Education/Training Program

## 2017-12-25 ENCOUNTER — Other Ambulatory Visit: Payer: Self-pay

## 2017-12-25 VITALS — BP 163/82 | HR 56 | Temp 98.3°F | Resp 16 | Ht 67.0 in | Wt 130.0 lb

## 2017-12-25 DIAGNOSIS — J342 Deviated nasal septum: Secondary | ICD-10-CM | POA: Diagnosis not present

## 2017-12-25 DIAGNOSIS — K2970 Gastritis, unspecified, without bleeding: Secondary | ICD-10-CM | POA: Diagnosis not present

## 2017-12-25 DIAGNOSIS — G629 Polyneuropathy, unspecified: Secondary | ICD-10-CM

## 2017-12-25 DIAGNOSIS — J449 Chronic obstructive pulmonary disease, unspecified: Secondary | ICD-10-CM | POA: Diagnosis not present

## 2017-12-25 DIAGNOSIS — Z87442 Personal history of urinary calculi: Secondary | ICD-10-CM | POA: Insufficient documentation

## 2017-12-25 DIAGNOSIS — M4726 Other spondylosis with radiculopathy, lumbar region: Secondary | ICD-10-CM

## 2017-12-25 DIAGNOSIS — Z7982 Long term (current) use of aspirin: Secondary | ICD-10-CM | POA: Insufficient documentation

## 2017-12-25 DIAGNOSIS — F17210 Nicotine dependence, cigarettes, uncomplicated: Secondary | ICD-10-CM | POA: Insufficient documentation

## 2017-12-25 DIAGNOSIS — Z77090 Contact with and (suspected) exposure to asbestos: Secondary | ICD-10-CM | POA: Diagnosis not present

## 2017-12-25 DIAGNOSIS — G608 Other hereditary and idiopathic neuropathies: Secondary | ICD-10-CM

## 2017-12-25 DIAGNOSIS — Z969 Presence of functional implant, unspecified: Secondary | ICD-10-CM | POA: Diagnosis not present

## 2017-12-25 DIAGNOSIS — M1610 Unilateral primary osteoarthritis, unspecified hip: Secondary | ICD-10-CM | POA: Diagnosis not present

## 2017-12-25 DIAGNOSIS — Z96642 Presence of left artificial hip joint: Secondary | ICD-10-CM | POA: Diagnosis not present

## 2017-12-25 DIAGNOSIS — D509 Iron deficiency anemia, unspecified: Secondary | ICD-10-CM | POA: Insufficient documentation

## 2017-12-25 DIAGNOSIS — I10 Essential (primary) hypertension: Secondary | ICD-10-CM | POA: Insufficient documentation

## 2017-12-25 DIAGNOSIS — M79605 Pain in left leg: Secondary | ICD-10-CM | POA: Insufficient documentation

## 2017-12-25 DIAGNOSIS — K219 Gastro-esophageal reflux disease without esophagitis: Secondary | ICD-10-CM | POA: Diagnosis not present

## 2017-12-25 DIAGNOSIS — E871 Hypo-osmolality and hyponatremia: Secondary | ICD-10-CM | POA: Insufficient documentation

## 2017-12-25 DIAGNOSIS — M479 Spondylosis, unspecified: Secondary | ICD-10-CM | POA: Insufficient documentation

## 2017-12-25 DIAGNOSIS — Z79899 Other long term (current) drug therapy: Secondary | ICD-10-CM | POA: Insufficient documentation

## 2017-12-25 DIAGNOSIS — Z9889 Other specified postprocedural states: Secondary | ICD-10-CM

## 2017-12-25 DIAGNOSIS — M79672 Pain in left foot: Secondary | ICD-10-CM

## 2017-12-25 DIAGNOSIS — R200 Anesthesia of skin: Secondary | ICD-10-CM | POA: Diagnosis not present

## 2017-12-25 DIAGNOSIS — G2581 Restless legs syndrome: Secondary | ICD-10-CM | POA: Insufficient documentation

## 2017-12-25 DIAGNOSIS — Z5181 Encounter for therapeutic drug level monitoring: Secondary | ICD-10-CM | POA: Insufficient documentation

## 2017-12-25 DIAGNOSIS — K222 Esophageal obstruction: Secondary | ICD-10-CM | POA: Insufficient documentation

## 2017-12-25 DIAGNOSIS — E782 Mixed hyperlipidemia: Secondary | ICD-10-CM | POA: Insufficient documentation

## 2017-12-25 DIAGNOSIS — H9190 Unspecified hearing loss, unspecified ear: Secondary | ICD-10-CM | POA: Diagnosis not present

## 2017-12-25 DIAGNOSIS — M79604 Pain in right leg: Secondary | ICD-10-CM | POA: Insufficient documentation

## 2017-12-25 DIAGNOSIS — M79671 Pain in right foot: Secondary | ICD-10-CM

## 2017-12-25 DIAGNOSIS — E43 Unspecified severe protein-calorie malnutrition: Secondary | ICD-10-CM | POA: Diagnosis not present

## 2017-12-25 DIAGNOSIS — G894 Chronic pain syndrome: Secondary | ICD-10-CM | POA: Diagnosis not present

## 2017-12-25 DIAGNOSIS — M542 Cervicalgia: Secondary | ICD-10-CM | POA: Insufficient documentation

## 2017-12-25 DIAGNOSIS — R202 Paresthesia of skin: Secondary | ICD-10-CM

## 2017-12-25 MED ORDER — AMITRIPTYLINE HCL 50 MG PO TABS: 50.0000 mg | ORAL_TABLET | Freq: Every day | ORAL | 3 refills | Status: DC

## 2017-12-25 MED ORDER — PREGABALIN 50 MG PO CAPS: 50.0000 mg | ORAL_CAPSULE | Freq: Three times a day (TID) | ORAL | 3 refills | Status: DC

## 2017-12-25 NOTE — Progress Notes (Signed)
Nursing Pain Medication Assessment:  Safety precautions to be maintained throughout the outpatient stay will include: orient to surroundings, keep bed in low position, maintain call bell within reach at all times, provide assistance with transfer out of bed and ambulation.  Medication Inspection Compliance: Kevin Alexander did not comply with our request to bring his pills to be counted. He was reminded that bringing the medication bottles, even when empty, is a requirement.  Medication: None brought in. Pill/Patch Count: None available to be counted. Bottle Appearance: No container available. Did not bring bottle(s) to appointment. Filled Date: N/A Last Medication intake:  Today
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
## 2017-12-31 DIAGNOSIS — Q231 Congenital insufficiency of aortic valve: Secondary | ICD-10-CM | POA: Insufficient documentation

## 2018-02-05 ENCOUNTER — Inpatient Hospital Stay: Payer: Medicare Other | Attending: Oncology

## 2018-02-05 ENCOUNTER — Other Ambulatory Visit: Payer: Self-pay | Admitting: *Deleted

## 2018-02-05 DIAGNOSIS — R590 Localized enlarged lymph nodes: Secondary | ICD-10-CM | POA: Diagnosis not present

## 2018-02-05 DIAGNOSIS — D72829 Elevated white blood cell count, unspecified: Secondary | ICD-10-CM | POA: Diagnosis not present

## 2018-02-05 DIAGNOSIS — D509 Iron deficiency anemia, unspecified: Secondary | ICD-10-CM | POA: Insufficient documentation

## 2018-02-05 DIAGNOSIS — D649 Anemia, unspecified: Secondary | ICD-10-CM

## 2018-02-05 LAB — CBC WITH DIFFERENTIAL/PLATELET
Basophils Absolute: 0.1 10*3/uL (ref 0–0.1)
Basophils Relative: 1 %
Eosinophils Absolute: 0.3 10*3/uL (ref 0–0.7)
Eosinophils Relative: 2 %
HCT: 42.1 % (ref 40.0–52.0)
Hemoglobin: 14.2 g/dL (ref 13.0–18.0)
Lymphocytes Relative: 19 %
Lymphs Abs: 2.6 10*3/uL (ref 1.0–3.6)
MCH: 31.4 pg (ref 26.0–34.0)
MCHC: 33.7 g/dL (ref 32.0–36.0)
MCV: 92.9 fL (ref 80.0–100.0)
Monocytes Absolute: 0.8 10*3/uL (ref 0.2–1.0)
Monocytes Relative: 6 %
Neutro Abs: 10.3 10*3/uL — ABNORMAL HIGH (ref 1.4–6.5)
Neutrophils Relative %: 72 %
Platelets: 288 10*3/uL (ref 150–440)
RBC: 4.53 MIL/uL (ref 4.40–5.90)
RDW: 13.5 % (ref 11.5–14.5)
WBC: 14 10*3/uL — ABNORMAL HIGH (ref 3.8–10.6)

## 2018-02-05 LAB — FERRITIN: Ferritin: 293 ng/mL (ref 24–336)

## 2018-02-05 LAB — IRON AND TIBC
Iron: 52 ug/dL (ref 45–182)
Saturation Ratios: 32 % (ref 17.9–39.5)
TIBC: 164 ug/dL — ABNORMAL LOW (ref 250–450)
UIBC: 112 ug/dL
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
## 2018-02-07 ENCOUNTER — Inpatient Hospital Stay (HOSPITAL_BASED_OUTPATIENT_CLINIC_OR_DEPARTMENT_OTHER): Payer: Medicare Other | Admitting: Oncology

## 2018-02-07 ENCOUNTER — Other Ambulatory Visit: Payer: Self-pay

## 2018-02-07 ENCOUNTER — Inpatient Hospital Stay: Payer: Medicare Other

## 2018-02-07 VITALS — BP 131/80 | HR 69 | Temp 95.2°F | Resp 18 | Wt 138.0 lb

## 2018-02-07 DIAGNOSIS — D508 Other iron deficiency anemias: Secondary | ICD-10-CM

## 2018-02-07 DIAGNOSIS — D509 Iron deficiency anemia, unspecified: Secondary | ICD-10-CM | POA: Diagnosis not present

## 2018-02-07 DIAGNOSIS — R590 Localized enlarged lymph nodes: Secondary | ICD-10-CM

## 2018-02-07 DIAGNOSIS — D72829 Elevated white blood cell count, unspecified: Secondary | ICD-10-CM | POA: Diagnosis not present

## 2018-02-07 NOTE — Progress Notes (Signed)
Here for follow up. Stated overall feeling better w improved energy

## 2018-03-27 ENCOUNTER — Encounter: Payer: Self-pay | Admitting: Student in an Organized Health Care Education/Training Program

## 2018-03-27 ENCOUNTER — Other Ambulatory Visit: Payer: Self-pay

## 2018-03-27 ENCOUNTER — Ambulatory Visit
Payer: Medicare Other | Attending: Student in an Organized Health Care Education/Training Program | Admitting: Student in an Organized Health Care Education/Training Program

## 2018-03-27 VITALS — BP 148/81 | HR 65 | Temp 98.2°F | Resp 16 | Ht 67.0 in | Wt 145.0 lb

## 2018-03-27 DIAGNOSIS — R1310 Dysphagia, unspecified: Secondary | ICD-10-CM | POA: Insufficient documentation

## 2018-03-27 DIAGNOSIS — Z96642 Presence of left artificial hip joint: Secondary | ICD-10-CM | POA: Insufficient documentation

## 2018-03-27 DIAGNOSIS — G894 Chronic pain syndrome: Secondary | ICD-10-CM

## 2018-03-27 DIAGNOSIS — J449 Chronic obstructive pulmonary disease, unspecified: Secondary | ICD-10-CM | POA: Insufficient documentation

## 2018-03-27 DIAGNOSIS — E782 Mixed hyperlipidemia: Secondary | ICD-10-CM | POA: Diagnosis not present

## 2018-03-27 DIAGNOSIS — I10 Essential (primary) hypertension: Secondary | ICD-10-CM | POA: Diagnosis not present

## 2018-03-27 DIAGNOSIS — R202 Paresthesia of skin: Secondary | ICD-10-CM

## 2018-03-27 DIAGNOSIS — J342 Deviated nasal septum: Secondary | ICD-10-CM | POA: Insufficient documentation

## 2018-03-27 DIAGNOSIS — G608 Other hereditary and idiopathic neuropathies: Secondary | ICD-10-CM | POA: Diagnosis not present

## 2018-03-27 DIAGNOSIS — R1011 Right upper quadrant pain: Secondary | ICD-10-CM | POA: Diagnosis not present

## 2018-03-27 DIAGNOSIS — D509 Iron deficiency anemia, unspecified: Secondary | ICD-10-CM | POA: Insufficient documentation

## 2018-03-27 DIAGNOSIS — G629 Polyneuropathy, unspecified: Secondary | ICD-10-CM

## 2018-03-27 DIAGNOSIS — M79641 Pain in right hand: Secondary | ICD-10-CM | POA: Insufficient documentation

## 2018-03-27 DIAGNOSIS — R200 Anesthesia of skin: Secondary | ICD-10-CM | POA: Diagnosis not present

## 2018-03-27 DIAGNOSIS — Z7982 Long term (current) use of aspirin: Secondary | ICD-10-CM | POA: Diagnosis not present

## 2018-03-27 DIAGNOSIS — M79672 Pain in left foot: Secondary | ICD-10-CM

## 2018-03-27 DIAGNOSIS — M25562 Pain in left knee: Secondary | ICD-10-CM | POA: Diagnosis not present

## 2018-03-27 DIAGNOSIS — R112 Nausea with vomiting, unspecified: Secondary | ICD-10-CM | POA: Diagnosis not present

## 2018-03-27 DIAGNOSIS — M79604 Pain in right leg: Secondary | ICD-10-CM | POA: Diagnosis not present

## 2018-03-27 DIAGNOSIS — M4726 Other spondylosis with radiculopathy, lumbar region: Secondary | ICD-10-CM | POA: Insufficient documentation

## 2018-03-27 DIAGNOSIS — Z883 Allergy status to other anti-infective agents status: Secondary | ICD-10-CM | POA: Insufficient documentation

## 2018-03-27 DIAGNOSIS — M79642 Pain in left hand: Secondary | ICD-10-CM | POA: Insufficient documentation

## 2018-03-27 DIAGNOSIS — K219 Gastro-esophageal reflux disease without esophagitis: Secondary | ICD-10-CM | POA: Diagnosis not present

## 2018-03-27 DIAGNOSIS — Z888 Allergy status to other drugs, medicaments and biological substances status: Secondary | ICD-10-CM | POA: Insufficient documentation

## 2018-03-27 DIAGNOSIS — M542 Cervicalgia: Secondary | ICD-10-CM | POA: Diagnosis not present

## 2018-03-27 DIAGNOSIS — M79605 Pain in left leg: Secondary | ICD-10-CM | POA: Insufficient documentation

## 2018-03-27 DIAGNOSIS — M79671 Pain in right foot: Secondary | ICD-10-CM | POA: Diagnosis present

## 2018-03-27 DIAGNOSIS — G2581 Restless legs syndrome: Secondary | ICD-10-CM | POA: Insufficient documentation

## 2018-03-27 DIAGNOSIS — Z77090 Contact with and (suspected) exposure to asbestos: Secondary | ICD-10-CM | POA: Diagnosis not present

## 2018-03-27 MED ORDER — TIZANIDINE HCL 4 MG PO TABS: 4.0000 mg | ORAL_TABLET | Freq: Every evening | ORAL | 2 refills | Status: DC | PRN

## 2018-03-27 MED ORDER — AMITRIPTYLINE HCL 50 MG PO TABS: 50.0000 mg | ORAL_TABLET | Freq: Every day | ORAL | 2 refills | Status: DC

## 2018-03-27 MED ORDER — PREGABALIN 75 MG PO CAPS: 75.0000 mg | ORAL_CAPSULE | Freq: Three times a day (TID) | ORAL | 2 refills | Status: DC

## 2018-03-27 NOTE — Patient Instructions (Signed)
Rx for Elavil, Lyrica and Tizanidine has been escribed to your pharmacy.
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
## 2018-03-27 NOTE — Progress Notes (Signed)
Safety precautions to be maintained throughout the outpatient stay will include: orient to surroundings, keep bed in low position, maintain call bell within reach at all times, provide assistance with transfer out of bed and ambulation.  

## 2018-05-28 ENCOUNTER — Other Ambulatory Visit: Payer: Self-pay | Admitting: General Surgery

## 2018-05-28 DIAGNOSIS — R1032 Left lower quadrant pain: Secondary | ICD-10-CM

## 2018-05-31 ENCOUNTER — Encounter (INDEPENDENT_AMBULATORY_CARE_PROVIDER_SITE_OTHER): Payer: Self-pay

## 2018-05-31 ENCOUNTER — Ambulatory Visit
Admission: RE | Admit: 2018-05-31 | Discharge: 2018-05-31 | Disposition: A | Discharge status: Home / Self Care | Payer: Medicare Other | Source: Ambulatory Visit | Attending: General Surgery | Admitting: General Surgery

## 2018-05-31 DIAGNOSIS — R1032 Left lower quadrant pain: Secondary | ICD-10-CM | POA: Diagnosis present

## 2018-06-27 ENCOUNTER — Encounter: Payer: Medicare Other | Admitting: Student in an Organized Health Care Education/Training Program

## 2018-07-02 ENCOUNTER — Encounter: Payer: Self-pay | Admitting: Student in an Organized Health Care Education/Training Program

## 2018-07-02 ENCOUNTER — Other Ambulatory Visit: Payer: Self-pay

## 2018-07-02 ENCOUNTER — Ambulatory Visit
Payer: Medicare Other | Attending: Student in an Organized Health Care Education/Training Program | Admitting: Student in an Organized Health Care Education/Training Program

## 2018-07-02 VITALS — BP 125/78 | HR 78 | Temp 98.5°F | Resp 16 | Ht 67.0 in | Wt 145.0 lb

## 2018-07-02 DIAGNOSIS — G629 Polyneuropathy, unspecified: Secondary | ICD-10-CM | POA: Diagnosis not present

## 2018-07-02 DIAGNOSIS — R202 Paresthesia of skin: Secondary | ICD-10-CM | POA: Diagnosis present

## 2018-07-02 DIAGNOSIS — R200 Anesthesia of skin: Secondary | ICD-10-CM | POA: Diagnosis not present

## 2018-07-02 DIAGNOSIS — M4726 Other spondylosis with radiculopathy, lumbar region: Secondary | ICD-10-CM | POA: Diagnosis not present

## 2018-07-02 DIAGNOSIS — G894 Chronic pain syndrome: Secondary | ICD-10-CM | POA: Diagnosis present

## 2018-07-02 DIAGNOSIS — G608 Other hereditary and idiopathic neuropathies: Secondary | ICD-10-CM | POA: Insufficient documentation

## 2018-07-02 MED ORDER — PREGABALIN 100 MG PO CAPS: 100.0000 mg | ORAL_CAPSULE | Freq: Three times a day (TID) | ORAL | 4 refills | Status: DC

## 2018-07-02 MED ORDER — TIZANIDINE HCL 4 MG PO TABS: 4.0000 mg | ORAL_TABLET | Freq: Three times a day (TID) | ORAL | 4 refills | Status: DC | PRN

## 2018-07-02 NOTE — Progress Notes (Signed)
Safety precautions to be maintained throughout the outpatient stay will include: orient to surroundings, keep bed in low position, maintain call bell within reach at all times, provide assistance with transfer out of bed and ambulation.  
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
## 2018-07-03 LAB — COMPREHENSIVE METABOLIC PANEL
ALT: 7 IU/L (ref 0–44)
AST: 12 IU/L (ref 0–40)
Albumin/Globulin Ratio: 1.4 (ref 1.2–2.2)
Albumin: 3.7 g/dL (ref 3.5–5.5)
Alkaline Phosphatase: 133 IU/L — ABNORMAL HIGH (ref 39–117)
BUN/Creatinine Ratio: 6 — ABNORMAL LOW (ref 9–20)
BUN: 5 mg/dL — ABNORMAL LOW (ref 6–24)
Bilirubin Total: <0.2 mg/dL (ref 0.0–1.2)
CO2: 20 mmol/L (ref 20–29)
Calcium: 9.3 mg/dL (ref 8.7–10.2)
Chloride: 106 mmol/L (ref 96–106)
Creatinine, Ser: 0.89 mg/dL (ref 0.76–1.27)
GFR calc Af Amer: 114 mL/min/{1.73_m2} (ref >59)
GFR calc non Af Amer: 98 mL/min/{1.73_m2} (ref >59)
Globulin, Total: 2.7 g/dL (ref 1.5–4.5)
Glucose: 110 mg/dL — ABNORMAL HIGH (ref 65–99)
Potassium: 3.3 mmol/L — ABNORMAL LOW (ref 3.5–5.2)
Sodium: 142 mmol/L (ref 134–144)
Total Protein: 6.4 g/dL (ref 6.0–8.5)

## 2018-07-19 ENCOUNTER — Other Ambulatory Visit: Payer: Self-pay | Admitting: Student in an Organized Health Care Education/Training Program

## 2018-10-24 ENCOUNTER — Encounter: Payer: Self-pay | Admitting: Student in an Organized Health Care Education/Training Program

## 2018-10-25 ENCOUNTER — Other Ambulatory Visit: Payer: Self-pay | Admitting: Student in an Organized Health Care Education/Training Program

## 2018-10-28 ENCOUNTER — Ambulatory Visit
Payer: Medicare Other | Attending: Student in an Organized Health Care Education/Training Program | Admitting: Student in an Organized Health Care Education/Training Program

## 2018-10-28 ENCOUNTER — Other Ambulatory Visit: Payer: Self-pay

## 2018-10-28 DIAGNOSIS — M4726 Other spondylosis with radiculopathy, lumbar region: Secondary | ICD-10-CM | POA: Diagnosis not present

## 2018-10-28 DIAGNOSIS — Z969 Presence of functional implant, unspecified: Secondary | ICD-10-CM

## 2018-10-28 DIAGNOSIS — M79672 Pain in left foot: Secondary | ICD-10-CM

## 2018-10-28 DIAGNOSIS — R200 Anesthesia of skin: Secondary | ICD-10-CM

## 2018-10-28 DIAGNOSIS — M79671 Pain in right foot: Secondary | ICD-10-CM

## 2018-10-28 DIAGNOSIS — G629 Polyneuropathy, unspecified: Secondary | ICD-10-CM | POA: Diagnosis not present

## 2018-10-28 DIAGNOSIS — M1610 Unilateral primary osteoarthritis, unspecified hip: Secondary | ICD-10-CM

## 2018-10-28 DIAGNOSIS — Z9889 Other specified postprocedural states: Secondary | ICD-10-CM

## 2018-10-28 DIAGNOSIS — R202 Paresthesia of skin: Secondary | ICD-10-CM

## 2018-10-28 DIAGNOSIS — G608 Other hereditary and idiopathic neuropathies: Secondary | ICD-10-CM | POA: Diagnosis not present

## 2018-10-28 DIAGNOSIS — G894 Chronic pain syndrome: Secondary | ICD-10-CM

## 2018-10-28 MED ORDER — PREGABALIN 100 MG PO CAPS: 100.0000 mg | ORAL_CAPSULE | Freq: Three times a day (TID) | ORAL | 4 refills | Status: DC

## 2018-10-28 MED ORDER — TIZANIDINE HCL 4 MG PO TABS: 2.0000 mg | ORAL_TABLET | Freq: Three times a day (TID) | ORAL | 4 refills | Status: DC | PRN
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
## 2018-12-09 ENCOUNTER — Telehealth: Payer: Self-pay | Admitting: Gastroenterology

## 2018-12-09 NOTE — Telephone Encounter (Signed)
Patient is having severe nausea, loss of appetite and diarrhea. Made an upcoming appointment. Would like Zofran called into [facility].

## 2018-12-09 NOTE — Telephone Encounter (Signed)
Zofran 4mg  q8 hours would be fine. Dispense 40. No refills.

## 2018-12-10 ENCOUNTER — Other Ambulatory Visit: Payer: Self-pay

## 2018-12-10 MED ORDER — ONDANSETRON HCL 4 MG PO TABS: 4.0000 mg | ORAL_TABLET | Freq: Three times a day (TID) | ORAL | 0 refills | Status: DC | PRN

## 2018-12-10 NOTE — Telephone Encounter (Signed)
LVM to notify patient that a prescription has been sent to [facility] for him.  Asked patient to call the office if he has any questions.  Thanks [employer]

## 2018-12-11 ENCOUNTER — Emergency Department
Admission: EM | Admit: 2018-12-11 | Discharge: 2018-12-11 | Disposition: A | Discharge status: Home / Self Care | Payer: Medicare Other | Attending: Emergency Medicine | Admitting: Emergency Medicine

## 2018-12-11 ENCOUNTER — Other Ambulatory Visit: Payer: Self-pay

## 2018-12-11 DIAGNOSIS — I10 Essential (primary) hypertension: Secondary | ICD-10-CM | POA: Insufficient documentation

## 2018-12-11 DIAGNOSIS — R112 Nausea with vomiting, unspecified: Secondary | ICD-10-CM | POA: Diagnosis not present

## 2018-12-11 DIAGNOSIS — Z79899 Other long term (current) drug therapy: Secondary | ICD-10-CM | POA: Diagnosis not present

## 2018-12-11 DIAGNOSIS — E860 Dehydration: Secondary | ICD-10-CM | POA: Diagnosis not present

## 2018-12-11 DIAGNOSIS — F17210 Nicotine dependence, cigarettes, uncomplicated: Secondary | ICD-10-CM | POA: Insufficient documentation

## 2018-12-11 DIAGNOSIS — R197 Diarrhea, unspecified: Secondary | ICD-10-CM | POA: Insufficient documentation

## 2018-12-11 DIAGNOSIS — Z7982 Long term (current) use of aspirin: Secondary | ICD-10-CM | POA: Diagnosis not present

## 2018-12-11 DIAGNOSIS — R1110 Vomiting, unspecified: Secondary | ICD-10-CM | POA: Diagnosis present

## 2018-12-11 DIAGNOSIS — E876 Hypokalemia: Secondary | ICD-10-CM | POA: Insufficient documentation

## 2018-12-11 LAB — COMPREHENSIVE METABOLIC PANEL
ALT: 18 U/L (ref 0–44)
AST: 25 U/L (ref 15–41)
Albumin: 2.4 g/dL — ABNORMAL LOW (ref 3.5–5.0)
Alkaline Phosphatase: 268 U/L — ABNORMAL HIGH (ref 38–126)
Anion gap: 11 (ref 5–15)
BUN: 7 mg/dL (ref 6–20)
CO2: 26 mmol/L (ref 22–32)
Calcium: 7.6 mg/dL — ABNORMAL LOW (ref 8.9–10.3)
Chloride: 100 mmol/L (ref 98–111)
Creatinine, Ser: 1.48 mg/dL — ABNORMAL HIGH (ref 0.61–1.24)
GFR calc Af Amer: >60 mL/min (ref >60)
GFR calc non Af Amer: 53 mL/min — ABNORMAL LOW (ref >60)
Glucose, Bld: 108 mg/dL — ABNORMAL HIGH (ref 70–99)
Potassium: 2.4 mmol/L — CL (ref 3.5–5.1)
Sodium: 137 mmol/L (ref 135–145)
Total Bilirubin: 0.7 mg/dL (ref 0.3–1.2)
Total Protein: 6.2 g/dL — ABNORMAL LOW (ref 6.5–8.1)

## 2018-12-11 LAB — CBC
HCT: 37.5 % — ABNORMAL LOW (ref 39.0–52.0)
Hemoglobin: 12.6 g/dL — ABNORMAL LOW (ref 13.0–17.0)
MCH: 30.1 pg (ref 26.0–34.0)
MCHC: 33.6 g/dL (ref 30.0–36.0)
MCV: 89.7 fL (ref 80.0–100.0)
Platelets: 376 10*3/uL (ref 150–400)
RBC: 4.18 MIL/uL — ABNORMAL LOW (ref 4.22–5.81)
RDW: 14.3 % (ref 11.5–15.5)
WBC: 14.4 10*3/uL — ABNORMAL HIGH (ref 4.0–10.5)
nRBC: 0 % (ref 0.0–0.2)

## 2018-12-11 LAB — LIPASE, BLOOD: Lipase: 36 U/L (ref 11–51)

## 2018-12-11 MED ORDER — SODIUM CHLORIDE 0.9 % IV BOLUS
1000.0000 mL | Freq: Once | INTRAVENOUS | Status: AC
  Administered 2018-12-11: 1000 mL via INTRAVENOUS

## 2018-12-11 MED ORDER — SODIUM CHLORIDE 0.9% FLUSH: 3.0000 mL | Freq: Once | INTRAVENOUS | Status: DC

## 2018-12-11 MED ORDER — POTASSIUM CHLORIDE ER 10 MEQ PO TBCR: 10.0000 meq | EXTENDED_RELEASE_TABLET | Freq: Every day | ORAL | 0 refills | Status: DC

## 2018-12-11 MED ORDER — POTASSIUM CHLORIDE CRYS ER 20 MEQ PO TBCR
40.0000 meq | EXTENDED_RELEASE_TABLET | Freq: Once | ORAL | Status: AC
  Administered 2018-12-11: 40 meq via ORAL
  Filled 2018-12-11: qty 2

## 2018-12-11 MED ORDER — METOCLOPRAMIDE HCL 10 MG PO TABS: 10.0000 mg | ORAL_TABLET | Freq: Three times a day (TID) | ORAL | 0 refills | Status: DC | PRN

## 2018-12-11 NOTE — ED Triage Notes (Addendum)
Pt comes into the ED via EMS from home with c/o abd pain with N/V/D for the past 2 weeks, gave pt 253ml bolus PTA. Pt states he passed out Monday and saturday

## 2018-12-11 NOTE — ED Notes (Signed)
Pt given 2 cans fo ginger ale and drank both without nausea or vomiting

## 2018-12-11 NOTE — ED Notes (Signed)
Dr Archie Balboa notified of potassium 2.4

## 2018-12-11 NOTE — ED Notes (Signed)
N/v/d x2 weeks- states his cousin had the same symptoms and he was helping take care of her

## 2018-12-11 NOTE — Discharge Instructions (Addendum)
As we discussed please talk to your doctors about ordering a stool sample test as an outpatient. Please seek medical attention for any high fevers, chest pain, shortness of breath, change in behavior, persistent vomiting, bloody stool or any other new or concerning symptoms.
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
## 2018-12-11 NOTE — ED Notes (Signed)
[place] sent to lab

## 2018-12-16 ENCOUNTER — Ambulatory Visit: Payer: Medicare Other | Admitting: Gastroenterology

## 2018-12-16 NOTE — Progress Notes (Deleted)
Primary Care Physician: Sallee Lange, NP  Primary Gastroenterologist:  Dr. Lucilla Lame  No chief complaint on file.   HPI: Kevin Alexander is a 53 y.o. male here Nausea, Diarrhea and loss of appetite. This patient has been seen in the past by myself and Dr. Vira Agar.  The patient has had upper endoscopies with dilation and has had a diagnosis of candidiasis with treatment of this in the past. The patient recently called my office because he was having a lot of nausea and was asking for some Zofran.  The patient was given Zofran for his nausea and is now here to follow-up for his symptoms. Two days after given a Zofran the patient went to the emergency room and was evaluated there.The patient was recommended to have stool samples and was kept in the ER for some time waiting for the patient to have an episode of diarrhea which she never did have therefore they sent the patient home with a follow-up with me.  Current Outpatient Medications  Medication Sig Dispense Refill  . amitriptyline (ELAVIL) 50 MG tablet Take 1 tablet (50 mg total) by mouth at bedtime. 30 tablet 2  . aspirin EC 81 MG tablet Take 81 mg by mouth daily.     Marland Kitchen atorvastatin (LIPITOR) 40 MG tablet Take 40 mg by mouth every morning.     . carvedilol (COREG) 12.5 MG tablet Take 12.5 mg by mouth daily.    . DULoxetine (CYMBALTA) 60 MG capsule Take 60 mg by mouth daily.     . Ferrous Gluconate (IRON) 240 (27 Fe) MG TABS Take 240 mg by mouth daily.    . fexofenadine (ALLEGRA) 180 MG tablet Take 180 mg by mouth every morning.  0  . lisinopril (PRINIVIL,ZESTRIL) 40 MG tablet Take 40 mg by mouth daily.     . metoCLOPramide (REGLAN) 10 MG tablet Take 10 mg by mouth 3 (three) times daily.    . metoCLOPramide (REGLAN) 10 MG tablet Take 1 tablet (10 mg total) by mouth every 8 (eight) hours as needed for nausea or vomiting. 15 tablet 0  . omeprazole (PRILOSEC) 40 MG capsule Take 40 mg by mouth 2 (two) times daily.   3  .  ondansetron (ZOFRAN) 4 MG tablet Take 1 tablet (4 mg total) by mouth every 8 (eight) hours as needed for nausea or vomiting. 40 tablet 0  . potassium chloride (K-DUR) 10 MEQ tablet Take 1 tablet (10 mEq total) by mouth daily for 5 days. 5 tablet 0  . pregabalin (LYRICA) 100 MG capsule Take 1 capsule (100 mg total) by mouth 3 (three) times daily. 90 capsule 4  . promethazine (PHENERGAN) 25 MG tablet Take 1 tablet (25 mg total) by mouth every 4 (four) hours as needed for nausea or vomiting. 30 tablet 1  . sucralfate (CARAFATE) 1 g tablet Take 1 tablet (1 g total) by mouth 4 (four) times daily. 120 tablet 1  . tiZANidine (ZANAFLEX) 4 MG tablet Take 0.5 tablets (2 mg total) by mouth every 8 (eight) hours as needed for muscle spasms. 75 tablet 4   No current facility-administered medications for this visit.     Allergies as of 12/16/2018 - Review Complete 12/11/2018  Allergen Reaction Noted  . Fluconazole Rash 09/22/2016  . Gabapentin Other (See Comments) 11/03/2016  . Pantoprazole Other (See Comments) 08/24/2015  . Amlodipine Itching and Rash 05/22/2013    ROS:  General: Negative for anorexia, weight loss, fever, chills, fatigue, weakness. ENT: Negative  for hoarseness, difficulty swallowing , nasal congestion. CV: Negative for chest pain, angina, palpitations, dyspnea on exertion, peripheral edema.  Respiratory: Negative for dyspnea at rest, dyspnea on exertion, cough, sputum, wheezing.  GI: See history of present illness. GU:  Negative for dysuria, hematuria, urinary incontinence, urinary frequency, nocturnal urination.  Endo: Negative for unusual weight change.    Physical Examination:   There were no vitals taken for this visit.  General: Well-nourished, well-developed in no acute distress.  Eyes: No icterus. Conjunctivae pink. Mouth: Oropharyngeal mucosa moist and pink , no lesions erythema or exudate. Lungs: Clear to auscultation bilaterally. Non-labored. Heart: Regular rate and  rhythm, no murmurs rubs or gallops.  Abdomen: Bowel sounds are normal, nontender, nondistended, no hepatosplenomegaly or masses, no abdominal bruits or hernia , no rebound or guarding.   Extremities: No lower extremity edema. No clubbing or deformities. Neuro: Alert and oriented x 3.  Grossly intact. Skin: Warm and dry, no jaundice.   Psych: Alert and cooperative, normal mood and affect.  Labs:  ***  Imaging Studies: No results found.  Assessment and Plan:   Kevin Alexander is a 53 y.o. y/o male ***    Lucilla Lame, MD. Marval Regal   Note: This dictation was prepared with Dragon dictation along with smaller phrase technology. Any transcriptional errors that result from this process are unintentional.

## 2018-12-31 ENCOUNTER — Other Ambulatory Visit: Payer: Self-pay

## 2018-12-31 ENCOUNTER — Encounter: Payer: Self-pay | Admitting: Gastroenterology

## 2018-12-31 ENCOUNTER — Ambulatory Visit (INDEPENDENT_AMBULATORY_CARE_PROVIDER_SITE_OTHER): Payer: Medicare Other | Admitting: Gastroenterology

## 2018-12-31 VITALS — BP 103/64 | HR 101 | Temp 99.0°F | Ht 67.0 in | Wt 126.0 lb

## 2018-12-31 DIAGNOSIS — R112 Nausea with vomiting, unspecified: Secondary | ICD-10-CM | POA: Diagnosis not present

## 2018-12-31 DIAGNOSIS — R197 Diarrhea, unspecified: Secondary | ICD-10-CM | POA: Diagnosis not present

## 2018-12-31 MED ORDER — PEG 3350-KCL-NA BICARB-NACL 420 G PO SOLR: ORAL | 0 refills | Status: DC

## 2018-12-31 NOTE — Progress Notes (Signed)
Primary Care Physician: Sallee Lange, NP  Primary Gastroenterologist:  Dr. Lucilla Lame  Chief Complaint  Patient presents with  . Nausea and vomiting    HPI: Kevin Alexander is a 53 y.o. male here who is coming in today for a history of intermittent diarrhea nausea and vomiting.  The patient's weight has been up and down but he is not significantly lower today than he has been in the past see me.  The patient had been followed by Dr. Vira Agar for some time.  He has had an EGD with candidal esophagitis in the past with no cause seen.  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  He had a gastric emptying study done a few years ago that showed his 4-hour gastric emptying to be slightly low at 89% with normal is over 90%.  He now reports that the diarrhea has returned and he has been having worse nausea vomiting in the last month.  The patient was in the ER and was given some antiemetics and sent home.  The patient also reports that he has had a colonoscopy in the past with polyps but has not had a colonoscopy in the recent past. 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  Current Outpatient Medications  Medication Sig Dispense Refill  . amitriptyline (ELAVIL) 50 MG tablet Take 1 tablet (50 mg total) by mouth at bedtime. 30 tablet 2  . aspirin EC 81 MG tablet Take 81 mg by mouth daily.     Marland Kitchen atorvastatin (LIPITOR) 40 MG tablet Take 40 mg by mouth every morning.     . carvedilol (COREG) 12.5 MG tablet Take 12.5 mg by mouth daily.    . Ferrous Gluconate (IRON) 240 (27 Fe) MG TABS Take 240 mg by mouth daily.    . fexofenadine (ALLEGRA) 180 MG tablet Take 180 mg by mouth every morning.  0  . lisinopril (PRINIVIL,ZESTRIL) 40 MG tablet Take 40 mg by mouth daily.     . metoCLOPramide (REGLAN) 10 MG tablet Take 10 mg by mouth 3 (three) times daily.    . metoCLOPramide (REGLAN) 10 MG tablet Take 1 tablet (10 mg total) by mouth every 8  (eight) hours as needed for nausea or vomiting. 15 tablet 0  . omeprazole (PRILOSEC) 40 MG capsule Take 40 mg by mouth 2 (two) times daily.   3  . ondansetron (ZOFRAN) 4 MG tablet Take 1 tablet (4 mg total) by mouth every 8 (eight) hours as needed for nausea or vomiting. 40 tablet 0  . pregabalin (LYRICA) 100 MG capsule Take 1 capsule (100 mg total) by mouth 3 (three) times daily. 90 capsule 4  . promethazine (PHENERGAN) 25 MG tablet Take 1 tablet (25 mg total) by mouth every 4 (four) hours as needed for nausea or vomiting. 30 tablet 1  . tiZANidine (ZANAFLEX) 4 MG tablet Take 0.5 tablets (2 mg total) by mouth every 8 (eight) hours as needed for muscle spasms. 75 tablet 4  . DULoxetine (CYMBALTA) 60 MG capsule Take 60 mg by mouth daily.     . potassium chloride (K-DUR) 10 MEQ tablet Take 1 tablet (10 mEq total) by mouth daily for 5 days. 5 tablet 0  . sucralfate (CARAFATE) 1 g tablet Take 1 tablet (1 g total) by mouth 4 (four) times daily. 120 tablet 1   No current facility-administered medications for this visit.     Allergies as of  12/31/2018 - Review Complete 12/31/2018  Allergen Reaction Noted  . Fluconazole Rash 09/22/2016  . Gabapentin Other (See Comments) 11/03/2016  . Pantoprazole Other (See Comments) 08/24/2015  . Amlodipine Itching and Rash 05/22/2013    ROS:  General: Negative for anorexia, weight loss, fever, chills, fatigue, weakness. ENT: Negative for hoarseness, difficulty swallowing , nasal congestion. CV: Negative for chest pain, angina, palpitations, dyspnea on exertion, peripheral edema.  Respiratory: Negative for dyspnea at rest, dyspnea on exertion, cough, sputum, wheezing.  GI: See history of present illness. GU:  Negative for dysuria, hematuria, urinary incontinence, urinary frequency, nocturnal urination.  Endo: Negative for unusual weight change.    Physical Examination:   BP 103/64   Pulse (!) 101   Temp 99 F (37.2 C) (Oral)   Ht 5\' 7"  (1.702 m)   Wt  126 lb (57.2 kg)   BMI 19.73 kg/m   General: Well-nourished, well-developed in no acute distress.  Eyes: No icterus. Conjunctivae pink. Mouth: Oropharyngeal mucosa moist and pink , no lesions erythema or exudate. Lungs: Clear to auscultation bilaterally. Non-labored. Heart: Regular rate and rhythm, no murmurs rubs or gallops.  Abdomen: Bowel sounds are normal, nontender, nondistended, no hepatosplenomegaly or masses, no abdominal bruits or hernia , no rebound or guarding.   Extremities: No lower extremity edema. No clubbing or deformities. Neuro: Alert and oriented x 3.  Grossly intact. Skin: Warm and dry, no jaundice.   Psych: Alert and cooperative, normal mood and affect.  Labs:    Imaging Studies: No results found.  Assessment and Plan:   Kevin Alexander is a 53 y.o. y/o male who comes in with a history of colon polyps and is in need of a colonoscopy.  The patient also has had diarrhea and is not able to gain much weight.  The patient also reports that he has had worsening of his nausea and vomiting for the last year.  The patient did have his gallbladder ruled out the last time he saw me with a normal gallbladder ejection fraction.  The patient has had candidal esophagitis and now reports having dysphasia again and will have a repeat EGD in addition to his colonoscopy.  The patient will also be set up for a repeat gastric emptying study to see if the gastric emptying has gotten worse as the cause of his symptoms.  The patient will continue his antiemetic medication and try to take boost or Ensure to help keep his weight up.  The patient and his wife have been explained the plan and agree with it.    Lucilla Lame, MD. Marval Regal   Note: This dictation was prepared with Dragon dictation along with smaller phrase technology. Any transcriptional errors that result from this process are unintentional.

## 2018-12-31 NOTE — H&P (View-Only) (Signed)
Primary Care Physician: Sallee Lange, NP  Primary Gastroenterologist:  Dr. Lucilla Lame  Chief Complaint  Patient presents with  . Nausea and vomiting    HPI: Kevin Alexander is a 53 y.o. male here who is coming in today for a history of intermittent diarrhea nausea and vomiting.  The patient's weight has been up and down but he is not significantly lower today than he has been in the past see me.  The patient had been followed by Dr. Vira Agar for some time.  He has had an EGD with candidal esophagitis in the past with no cause seen.  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  He had a gastric emptying study done a few years ago that showed his 4-hour gastric emptying to be slightly low at 89% with normal is over 90%.  He now reports that the diarrhea has returned and he has been having worse nausea vomiting in the last month.  The patient was in the ER and was given some antiemetics and sent home.  The patient also reports that he has had a colonoscopy in the past with polyps but has not had a colonoscopy in the recent past. 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  Current Outpatient Medications  Medication Sig Dispense Refill  . amitriptyline (ELAVIL) 50 MG tablet Take 1 tablet (50 mg total) by mouth at bedtime. 30 tablet 2  . aspirin EC 81 MG tablet Take 81 mg by mouth daily.     Marland Kitchen atorvastatin (LIPITOR) 40 MG tablet Take 40 mg by mouth every morning.     . carvedilol (COREG) 12.5 MG tablet Take 12.5 mg by mouth daily.    . Ferrous Gluconate (IRON) 240 (27 Fe) MG TABS Take 240 mg by mouth daily.    . fexofenadine (ALLEGRA) 180 MG tablet Take 180 mg by mouth every morning.  0  . lisinopril (PRINIVIL,ZESTRIL) 40 MG tablet Take 40 mg by mouth daily.     . metoCLOPramide (REGLAN) 10 MG tablet Take 10 mg by mouth 3 (three) times daily.    . metoCLOPramide (REGLAN) 10 MG tablet Take 1 tablet (10 mg total) by mouth every 8  (eight) hours as needed for nausea or vomiting. 15 tablet 0  . omeprazole (PRILOSEC) 40 MG capsule Take 40 mg by mouth 2 (two) times daily.   3  . ondansetron (ZOFRAN) 4 MG tablet Take 1 tablet (4 mg total) by mouth every 8 (eight) hours as needed for nausea or vomiting. 40 tablet 0  . pregabalin (LYRICA) 100 MG capsule Take 1 capsule (100 mg total) by mouth 3 (three) times daily. 90 capsule 4  . promethazine (PHENERGAN) 25 MG tablet Take 1 tablet (25 mg total) by mouth every 4 (four) hours as needed for nausea or vomiting. 30 tablet 1  . tiZANidine (ZANAFLEX) 4 MG tablet Take 0.5 tablets (2 mg total) by mouth every 8 (eight) hours as needed for muscle spasms. 75 tablet 4  . DULoxetine (CYMBALTA) 60 MG capsule Take 60 mg by mouth daily.     . potassium chloride (K-DUR) 10 MEQ tablet Take 1 tablet (10 mEq total) by mouth daily for 5 days. 5 tablet 0  . sucralfate (CARAFATE) 1 g tablet Take 1 tablet (1 g total) by mouth 4 (four) times daily. 120 tablet 1   No current facility-administered medications for this visit.     Allergies as of  12/31/2018 - Review Complete 12/31/2018  Allergen Reaction Noted  . Fluconazole Rash 09/22/2016  . Gabapentin Other (See Comments) 11/03/2016  . Pantoprazole Other (See Comments) 08/24/2015  . Amlodipine Itching and Rash 05/22/2013    ROS:  General: Negative for anorexia, weight loss, fever, chills, fatigue, weakness. ENT: Negative for hoarseness, difficulty swallowing , nasal congestion. CV: Negative for chest pain, angina, palpitations, dyspnea on exertion, peripheral edema.  Respiratory: Negative for dyspnea at rest, dyspnea on exertion, cough, sputum, wheezing.  GI: See history of present illness. GU:  Negative for dysuria, hematuria, urinary incontinence, urinary frequency, nocturnal urination.  Endo: Negative for unusual weight change.    Physical Examination:   BP 103/64   Pulse (!) 101   Temp 99 F (37.2 C) (Oral)   Ht 5\' 7"  (1.702 m)   Wt  126 lb (57.2 kg)   BMI 19.73 kg/m   General: Well-nourished, well-developed in no acute distress.  Eyes: No icterus. Conjunctivae pink. Mouth: Oropharyngeal mucosa moist and pink , no lesions erythema or exudate. Lungs: Clear to auscultation bilaterally. Non-labored. Heart: Regular rate and rhythm, no murmurs rubs or gallops.  Abdomen: Bowel sounds are normal, nontender, nondistended, no hepatosplenomegaly or masses, no abdominal bruits or hernia , no rebound or guarding.   Extremities: No lower extremity edema. No clubbing or deformities. Neuro: Alert and oriented x 3.  Grossly intact. Skin: Warm and dry, no jaundice.   Psych: Alert and cooperative, normal mood and affect.  Labs:    Imaging Studies: No results found.  Assessment and Plan:   Kevin Alexander is a 53 y.o. y/o male who comes in with a history of colon polyps and is in need of a colonoscopy.  The patient also has had diarrhea and is not able to gain much weight.  The patient also reports that he has had worsening of his nausea and vomiting for the last year.  The patient did have his gallbladder ruled out the last time he saw me with a normal gallbladder ejection fraction.  The patient has had candidal esophagitis and now reports having dysphasia again and will have a repeat EGD in addition to his colonoscopy.  The patient will also be set up for a repeat gastric emptying study to see if the gastric emptying has gotten worse as the cause of his symptoms.  The patient will continue his antiemetic medication and try to take boost or Ensure to help keep his weight up.  The patient and his wife have been explained the plan and agree with it.    Lucilla Lame, MD. Marval Regal   Note: This dictation was prepared with Dragon dictation along with smaller phrase technology. Any transcriptional errors that result from this process are unintentional.

## 2018-12-31 NOTE — Patient Instructions (Signed)
You are scheduled for a gastric emptying study at [facility] on Monday, July 27th at 8:30am. Please arrive at the medical mall registration desk at 8:15am.  You cannot have anything to eat or drink after midnight.   You cannot take any stomach medications or any pain medications 8 hours prior to scan.  If you need to reschedule this appointment for any reason, please contact central scheduling at [phone].

## 2019-01-06 ENCOUNTER — Other Ambulatory Visit
Admission: RE | Admit: 2019-01-06 | Discharge: 2019-01-06 | Disposition: A | Discharge status: Home / Self Care | Payer: Medicare Other | Source: Ambulatory Visit | Attending: Gastroenterology | Admitting: Gastroenterology

## 2019-01-06 ENCOUNTER — Other Ambulatory Visit: Payer: Self-pay

## 2019-01-06 DIAGNOSIS — Z1159 Encounter for screening for other viral diseases: Secondary | ICD-10-CM | POA: Diagnosis present

## 2019-01-07 LAB — SARS CORONAVIRUS 2 (TAT 6-24 HRS): SARS Coronavirus 2: NEGATIVE

## 2019-01-08 ENCOUNTER — Telehealth: Payer: Self-pay | Admitting: Gastroenterology

## 2019-01-08 NOTE — Telephone Encounter (Signed)
Patient called & states he has a procedure on Friday & has questions about his prep. I tried to explain but he disagreed with me. He is stating the instructions say begin prep today.  Please call him with instructions.

## 2019-01-09 NOTE — Telephone Encounter (Signed)
Spoke with pt and he stated he got his days mixed up. Pt had no further questions.

## 2019-01-09 NOTE — Discharge Instructions (Signed)

## 2019-01-10 ENCOUNTER — Ambulatory Visit: Payer: Medicare Other | Admitting: Anesthesiology

## 2019-01-10 ENCOUNTER — Ambulatory Visit
Admission: RE | Admit: 2019-01-10 | Discharge: 2019-01-10 | Disposition: A | Discharge status: Home / Self Care | Payer: Medicare Other | Attending: Gastroenterology | Admitting: Gastroenterology

## 2019-01-10 ENCOUNTER — Encounter
Admission: RE | Disposition: A | Discharge status: Home / Self Care | Payer: Self-pay | Source: Home / Self Care | Attending: Gastroenterology

## 2019-01-10 ENCOUNTER — Other Ambulatory Visit: Payer: Self-pay

## 2019-01-10 DIAGNOSIS — G4730 Sleep apnea, unspecified: Secondary | ICD-10-CM | POA: Diagnosis not present

## 2019-01-10 DIAGNOSIS — R634 Abnormal weight loss: Secondary | ICD-10-CM | POA: Insufficient documentation

## 2019-01-10 DIAGNOSIS — F329 Major depressive disorder, single episode, unspecified: Secondary | ICD-10-CM | POA: Insufficient documentation

## 2019-01-10 DIAGNOSIS — Z7982 Long term (current) use of aspirin: Secondary | ICD-10-CM | POA: Diagnosis not present

## 2019-01-10 DIAGNOSIS — K635 Polyp of colon: Secondary | ICD-10-CM

## 2019-01-10 DIAGNOSIS — I10 Essential (primary) hypertension: Secondary | ICD-10-CM | POA: Insufficient documentation

## 2019-01-10 DIAGNOSIS — Z8719 Personal history of other diseases of the digestive system: Secondary | ICD-10-CM | POA: Diagnosis not present

## 2019-01-10 DIAGNOSIS — K219 Gastro-esophageal reflux disease without esophagitis: Secondary | ICD-10-CM | POA: Insufficient documentation

## 2019-01-10 DIAGNOSIS — K209 Esophagitis, unspecified: Secondary | ICD-10-CM | POA: Insufficient documentation

## 2019-01-10 DIAGNOSIS — J449 Chronic obstructive pulmonary disease, unspecified: Secondary | ICD-10-CM | POA: Insufficient documentation

## 2019-01-10 DIAGNOSIS — Z8711 Personal history of peptic ulcer disease: Secondary | ICD-10-CM | POA: Diagnosis not present

## 2019-01-10 DIAGNOSIS — D123 Benign neoplasm of transverse colon: Secondary | ICD-10-CM | POA: Diagnosis not present

## 2019-01-10 DIAGNOSIS — Z79899 Other long term (current) drug therapy: Secondary | ICD-10-CM | POA: Insufficient documentation

## 2019-01-10 DIAGNOSIS — R1310 Dysphagia, unspecified: Secondary | ICD-10-CM | POA: Insufficient documentation

## 2019-01-10 DIAGNOSIS — G629 Polyneuropathy, unspecified: Secondary | ICD-10-CM | POA: Diagnosis not present

## 2019-01-10 DIAGNOSIS — I252 Old myocardial infarction: Secondary | ICD-10-CM | POA: Insufficient documentation

## 2019-01-10 DIAGNOSIS — Z86010 Personal history of colonic polyps: Secondary | ICD-10-CM | POA: Insufficient documentation

## 2019-01-10 DIAGNOSIS — R197 Diarrhea, unspecified: Secondary | ICD-10-CM | POA: Insufficient documentation

## 2019-01-10 DIAGNOSIS — D125 Benign neoplasm of sigmoid colon: Secondary | ICD-10-CM | POA: Diagnosis not present

## 2019-01-10 DIAGNOSIS — F17200 Nicotine dependence, unspecified, uncomplicated: Secondary | ICD-10-CM | POA: Insufficient documentation

## 2019-01-10 DIAGNOSIS — E876 Hypokalemia: Secondary | ICD-10-CM | POA: Insufficient documentation

## 2019-01-10 DIAGNOSIS — D122 Benign neoplasm of ascending colon: Secondary | ICD-10-CM | POA: Insufficient documentation

## 2019-01-10 DIAGNOSIS — R112 Nausea with vomiting, unspecified: Secondary | ICD-10-CM | POA: Insufficient documentation

## 2019-01-10 HISTORY — PX: POLYPECTOMY: SHX5525

## 2019-01-10 HISTORY — PX: ESOPHAGOGASTRODUODENOSCOPY (EGD) WITH PROPOFOL: SHX5813

## 2019-01-10 HISTORY — PX: COLONOSCOPY WITH PROPOFOL: SHX5780

## 2019-01-10 HISTORY — DX: Dyspnea, unspecified: R06.00

## 2019-01-10 SURGERY — COLONOSCOPY WITH PROPOFOL
Anesthesia: General | Site: Throat

## 2019-01-10 MED ORDER — ONDANSETRON HCL 4 MG/2ML IJ SOLN: 4.0000 mg | Freq: Once | INTRAMUSCULAR | Status: DC | PRN

## 2019-01-10 MED ORDER — ACETAMINOPHEN 160 MG/5ML PO SOLN: 325.0000 mg | ORAL | Status: DC | PRN

## 2019-01-10 MED ORDER — SODIUM CHLORIDE 0.9 % IV SOLN: INTRAVENOUS | Status: DC

## 2019-01-10 MED ORDER — STERILE WATER FOR IRRIGATION IR SOLN
Status: DC | PRN
  Administered 2019-01-10: .05 mL

## 2019-01-10 MED ORDER — LIDOCAINE HCL (CARDIAC) PF 100 MG/5ML IV SOSY
PREFILLED_SYRINGE | INTRAVENOUS | Status: DC | PRN
  Administered 2019-01-10: 30 mg via INTRAVENOUS

## 2019-01-10 MED ORDER — GLYCOPYRROLATE 0.2 MG/ML IJ SOLN
INTRAMUSCULAR | Status: DC | PRN
  Administered 2019-01-10: 0.1 mg via INTRAVENOUS

## 2019-01-10 MED ORDER — ACETAMINOPHEN 325 MG PO TABS: 650.0000 mg | ORAL_TABLET | Freq: Once | ORAL | Status: DC | PRN

## 2019-01-10 MED ORDER — PROPOFOL 10 MG/ML IV BOLUS
INTRAVENOUS | Status: DC | PRN
  Administered 2019-01-10: 30 mg via INTRAVENOUS
  Administered 2019-01-10 (×3): 20 mg via INTRAVENOUS
  Administered 2019-01-10: 30 mg via INTRAVENOUS
  Administered 2019-01-10 (×2): 40 mg via INTRAVENOUS
  Administered 2019-01-10: 20 mg via INTRAVENOUS
  Administered 2019-01-10: 30 mg via INTRAVENOUS
  Administered 2019-01-10: 20 mg via INTRAVENOUS
  Administered 2019-01-10: 30 mg via INTRAVENOUS
  Administered 2019-01-10: 40 mg via INTRAVENOUS
  Administered 2019-01-10: 100 mg via INTRAVENOUS
  Administered 2019-01-10 (×2): 40 mg via INTRAVENOUS

## 2019-01-10 MED ORDER — LACTATED RINGERS IV SOLN
INTRAVENOUS | Status: DC
  Administered 2019-01-10: 08:00:00 via INTRAVENOUS

## 2019-01-10 SURGICAL SUPPLY — 10 items
BLOCK BITE 60FR ADLT L/F GRN (MISCELLANEOUS) ×5 IMPLANT
BRUSH CYTO GASTROSCOPE 3.0 (MISCELLANEOUS) ×1 IMPLANT
BRUSH CYTO GASTROSCOPE 3.0MM (MISCELLANEOUS) ×1
CANISTER SUCT 1200ML W/VALVE (MISCELLANEOUS) ×5 IMPLANT
GOWN CVR UNV OPN BCK APRN NK (MISCELLANEOUS) ×6 IMPLANT
GOWN ISOL THUMB LOOP REG UNIV (MISCELLANEOUS) ×10
KIT ENDO PROCEDURE OLY (KITS) ×5 IMPLANT
SNARE SHORT THROW 13M SML OVAL (MISCELLANEOUS) ×2 IMPLANT
TRAP ETRAP POLY (MISCELLANEOUS) ×2 IMPLANT
WATER STERILE IRR 250ML POUR (IV SOLUTION) ×5 IMPLANT

## 2019-01-10 NOTE — Anesthesia Postprocedure Evaluation (Signed)
Anesthesia Post Note  Patient: Kevin Alexander  Procedure(s) Performed: COLONOSCOPY WITH PROPOFOL (N/A Rectum) ESOPHAGOGASTRODUODENOSCOPY (EGD) WITH PROPOFOL (N/A Throat) POLYPECTOMY (Rectum)  Patient location during evaluation: PACU Anesthesia Type: General Level of consciousness: awake and alert Pain management: pain level controlled Vital Signs Assessment: post-procedure vital signs reviewed and stable Respiratory status: spontaneous breathing, nonlabored ventilation, respiratory function stable and patient connected to nasal cannula oxygen Cardiovascular status: blood pressure returned to baseline and stable Postop Assessment: no apparent nausea or vomiting Anesthetic complications: no    Camyla Camposano ELAINE

## 2019-01-10 NOTE — Anesthesia Preprocedure Evaluation (Signed)
Anesthesia Evaluation  Patient identified by MRN, date of birth, ID band Patient awake    Reviewed: Allergy & Precautions, H&P , NPO status , Patient's Chart, lab work & pertinent test results  Airway Mallampati: II  TM Distance: >3 FB Neck ROM: full    Dental  (+) Edentulous Upper, Edentulous Lower   Pulmonary shortness of breath and with exertion, asthma , sleep apnea , COPD, Current Smoker,    Pulmonary exam normal breath sounds clear to auscultation       Cardiovascular hypertension, + Past MI  Normal cardiovascular exam+ Valvular Problems/Murmurs  Rhythm:regular Rate:Normal     Neuro/Psych PSYCHIATRIC DISORDERS Depression    GI/Hepatic PUD, GERD  ,  Endo/Other    Renal/GU      Musculoskeletal   Abdominal   Peds  Hematology  (+) Blood dyscrasia, anemia ,   Anesthesia Other Findings Chronic hypokalemia  Reproductive/Obstetrics                             Anesthesia Physical  Anesthesia Plan  ASA: III  Anesthesia Plan: General   Post-op Pain Management:    Induction: Intravenous  PONV Risk Score and Plan: 2 and Propofol infusion and Treatment may vary due to age or medical condition  Airway Management Planned: Natural Airway  Additional Equipment:   Intra-op Plan:   Post-operative Plan:   Informed Consent: I have reviewed the patients History and Physical, chart, labs and discussed the procedure including the risks, benefits and alternatives for the proposed anesthesia with the patient or authorized representative who has indicated his/her understanding and acceptance.       Plan Discussed with: CRNA  Anesthesia Plan Comments:         Anesthesia Quick Evaluation

## 2019-01-10 NOTE — Transfer of Care (Signed)
Immediate Anesthesia Transfer of Care Note  Patient: Kevin Alexander  Procedure(s) Performed: COLONOSCOPY WITH PROPOFOL (N/A Rectum) ESOPHAGOGASTRODUODENOSCOPY (EGD) WITH PROPOFOL (N/A Throat) POLYPECTOMY (Rectum)  Patient Location: PACU  Anesthesia Type: General  Level of Consciousness: awake, alert  and patient cooperative  Airway and Oxygen Therapy: Patient Spontanous Breathing and Patient connected to supplemental oxygen  Post-op Assessment: Post-op Vital signs reviewed, Patient's Cardiovascular Status Stable, Respiratory Function Stable, Patent Airway and No signs of Nausea or vomiting  Post-op Vital Signs: Reviewed and stable  Complications: No apparent anesthesia complications

## 2019-01-10 NOTE — Anesthesia Procedure Notes (Signed)
Date/Time: 01/10/2019 8:43 AM Performed by: Cameron Ali, CRNA Pre-anesthesia Checklist: Patient identified, Emergency Drugs available, Suction available, Timeout performed and Patient being monitored Patient Re-evaluated:Patient Re-evaluated prior to induction Oxygen Delivery Method: Nasal cannula Placement Confirmation: positive ETCO2

## 2019-01-10 NOTE — Op Note (Signed)
[facility] Gastroenterology Patient Name: Kevin Alexander Procedure Date: 01/10/2019 9:05 AM MRN: [id number] Account #: [id number] Date of Birth: [birth year] Admit Type: Outpatient Age: 53 Room: [facility] OR ROOM 01 Gender: Male Note Status: Finalized Procedure:            Colonoscopy Indications:          High risk colon cancer surveillance: Personal history                        of colonic polyps Providers:            Lucilla Lame MD, MD Medicines:            Propofol per Anesthesia Complications:        No immediate complications. Procedure:            Pre-Anesthesia Assessment:                       - Prior to the procedure, a History and Physical was                        performed, and patient medications and allergies were                        reviewed. The patient's tolerance of previous                        anesthesia was also reviewed. The risks and benefits of                        the procedure and the sedation options and risks were                        discussed with the patient. All questions were                        answered, and informed consent was obtained. Prior                        Anticoagulants: The patient has taken no previous                        anticoagulant or antiplatelet agents. ASA Grade                        Assessment: II - A patient with mild systemic disease.                        After reviewing the risks and benefits, the patient was                        deemed in satisfactory condition to undergo the                        procedure.                       After obtaining informed consent, the colonoscope was                        passed under direct vision. Throughout the  procedure,                        the patient's blood pressure, pulse, and oxygen                        saturations were monitored continuously. The was                        introduced through the anus and advanced to the the           cecum, identified by appendiceal orifice and ileocecal                        valve. The colonoscopy was performed without                        difficulty. The patient tolerated the procedure well.                        The quality of the bowel preparation was excellent. Findings:      The perianal and digital rectal examinations were normal.      Three sessile polyps were found in the ascending colon. The polyps were       3 to 5 mm in size. These polyps were removed with a cold snare.       Resection and retrieval were complete.      Two sessile polyps were found in the transverse colon. The polyps were 3       to 4 mm in size. These polyps were removed with a cold snare. Resection       and retrieval were complete.      A 2 mm polyp was found in the sigmoid colon. The polyp was sessile. The       polyp was removed with a cold snare. Resection and retrieval were       complete. Impression:           - Three 3 to 5 mm polyps in the ascending colon,                        removed with a cold snare. Resected and retrieved.                       - Two 3 to 4 mm polyps in the transverse colon, removed                        with a cold snare. Resected and retrieved.                       - One 2 mm polyp in the sigmoid colon, removed with a                        cold snare. Resected and retrieved. Recommendation:       - Discharge patient to home.                       - Resume previous diet.                       - Continue present medications.                       -  Await pathology results.                       - Repeat colonoscopy in 5 years for surveillance. Procedure Code(s):    --- Professional ---                       [phone], Colonoscopy, flexible; with removal of tumor(s),                        polyp(s), or other lesion(s) by snare technique Diagnosis Code(s):    --- Professional ---                       Z86.010, Personal history of colonic polyps                        K63.5, Polyp of colon CPT copyright 2019 American Medical Association. All rights reserved. The codes documented in this report are preliminary and upon coder review may  be revised to meet current compliance requirements. Lucilla Lame MD, MD 01/10/2019 9:24:06 AM This report has been signed electronically. Number of Addenda: 0 Note Initiated On: 01/10/2019 9:05 AM Scope Withdrawal Time: 0 hours 10 minutes 23 seconds  Total Procedure Duration: 0 hours 13 minutes 6 seconds  Estimated Blood Loss: Estimated blood loss: none.      [facility]

## 2019-01-10 NOTE — Op Note (Signed)
[facility] Gastroenterology Patient Name: Kevin Alexander Procedure Date: 01/10/2019 8:39 AM MRN: [id number] Account #: [id number] Date of Birth: 22-Oct-1965 Admit Type: Outpatient Age: 53 Room: [facility] OR ROOM 01 Gender: Male Note Status: Finalized Procedure:            Upper GI endoscopy Indications:          Nausea, Weight loss Providers:            Lucilla Lame MD, MD Referring MD:         Juluis Rainier (Referring MD) Medicines:            Propofol per Anesthesia Complications:        No immediate complications. Procedure:            Pre-Anesthesia Assessment:                       - Prior to the procedure, a History and Physical was                        performed, and patient medications and allergies were                        reviewed. The patient's tolerance of previous                        anesthesia was also reviewed. The risks and benefits of                        the procedure and the sedation options and risks were                        discussed with the patient. All questions were                        answered, and informed consent was obtained. Prior                        Anticoagulants: The patient has taken no previous                        anticoagulant or antiplatelet agents. ASA Grade                        Assessment: II - A patient with mild systemic disease.                        After reviewing the risks and benefits, the patient was                        deemed in satisfactory condition to undergo the                        procedure.                       After obtaining informed consent, the endoscope was                        passed under direct vision. Throughout the procedure,  the patient's blood pressure, pulse, and oxygen                        saturations were monitored continuously. The                        Endosonoscope was introduced through the mouth, and                        advanced to  the second part of duodenum. The upper GI                        endoscopy was accomplished without difficulty. The                        patient tolerated the procedure well. Findings:      LA Grade C (one or more mucosal breaks continuous between tops of 2 or       more mucosal folds, less than 75% circumference) esophagitis with no       bleeding was found in the entire esophagus. Cells for cytology were       obtained by brushing.      The stomach was normal.      The examined duodenum was normal. Impression:           - LA Grade C esophagitis. Cells for cytology obtained.                       - Normal stomach.                       - Normal examined duodenum. Recommendation:       - Discharge patient to home.                       - Resume previous diet.                       - Continue present medications.                       - Await pathology results. Procedure Code(s):    --- Professional ---                       [phone], Esophagogastroduodenoscopy, flexible, transoral;                        diagnostic, including collection of specimen(s) by                        brushing or washing, when performed (separate procedure) Diagnosis Code(s):    --- Professional ---                       R63.4, Abnormal weight loss                       R11.0, Nausea                       K20.9, Esophagitis, unspecified CPT copyright 2019 American Medical Association. All rights reserved. The codes documented in this report are preliminary and upon coder review may  be revised to  meet current compliance requirements. Lucilla Lame MD, MD 01/10/2019 9:05:35 AM This report has been signed electronically. Number of Addenda: 0 Note Initiated On: 01/10/2019 8:39 AM Total Procedure Duration: 0 hours 4 minutes 11 seconds  Estimated Blood Loss: Estimated blood loss: none.      [facility]

## 2019-01-10 NOTE — Interval H&P Note (Signed)
History and Physical Interval Note:  01/10/2019 7:44 AM  Kevin Alexander  has presented today for surgery, with the diagnosis of Nausea and vomiting R11.2 Diarrhea R19.7.  The various methods of treatment have been discussed with the patient and family. After consideration of risks, benefits and other options for treatment, the patient has consented to  Procedure(s): COLONOSCOPY WITH PROPOFOL (N/A) ESOPHAGOGASTRODUODENOSCOPY (EGD) WITH PROPOFOL (N/A) as a surgical intervention.  The patient's history has been reviewed, patient examined, no change in status, stable for surgery.  I have reviewed the patient's chart and labs.  Questions were answered to the patient's satisfaction.     [name] [employer]

## 2019-01-13 ENCOUNTER — Ambulatory Visit
Admission: RE | Admit: 2019-01-13 | Discharge: 2019-01-13 | Disposition: A | Discharge status: Home / Self Care | Payer: Medicare Other | Source: Ambulatory Visit | Attending: Gastroenterology | Admitting: Gastroenterology

## 2019-01-13 ENCOUNTER — Other Ambulatory Visit: Payer: Self-pay

## 2019-01-13 DIAGNOSIS — R112 Nausea with vomiting, unspecified: Secondary | ICD-10-CM | POA: Insufficient documentation

## 2019-01-13 MED ORDER — TECHNETIUM TC 99M SULFUR COLLOID
2.0000 | Freq: Once | INTRAVENOUS | Status: AC | PRN
  Administered 2019-01-13: 1.849 via ORAL

## 2019-01-13 MED ORDER — TECHNETIUM TC 99M SULFUR COLLOID: 2.0000 | Freq: Once | INTRAVENOUS | Status: DC | PRN

## 2019-01-14 ENCOUNTER — Encounter: Payer: Self-pay | Admitting: Gastroenterology

## 2019-01-14 ENCOUNTER — Telehealth: Payer: Self-pay

## 2019-01-14 NOTE — Telephone Encounter (Signed)
Pt notified of gastric emptying study results.  

## 2019-01-14 NOTE — Telephone Encounter (Signed)
-----   Message from Lucilla Lame, MD sent at 01/13/2019  6:37 PM EDT ----- Let the patient know that the gastric emptying study was normal. His stomach was emptying at a normal rate.

## 2019-01-20 ENCOUNTER — Encounter: Payer: Self-pay | Admitting: Gastroenterology

## 2019-01-21 ENCOUNTER — Telehealth: Payer: Self-pay

## 2019-01-21 NOTE — Telephone Encounter (Signed)
Pt's cousin, Marcie Bal has been notified of results.

## 2019-01-21 NOTE — Telephone Encounter (Signed)
-----   Message from Lucilla Lame, MD sent at 01/20/2019 10:16 AM EDT ----- Let the patient know that the esophageal brushings did not show any cancer or fungus.

## 2019-01-27 ENCOUNTER — Encounter: Payer: Self-pay | Admitting: Student in an Organized Health Care Education/Training Program

## 2019-01-27 NOTE — Progress Notes (Signed)
Patient would like me to report that the Lyrica is not working at all.  No  Negative affects but nothing positive either.

## 2019-01-28 ENCOUNTER — Ambulatory Visit
Payer: Medicare Other | Attending: Student in an Organized Health Care Education/Training Program | Admitting: Student in an Organized Health Care Education/Training Program

## 2019-01-28 ENCOUNTER — Encounter: Payer: Self-pay | Admitting: Student in an Organized Health Care Education/Training Program

## 2019-01-28 ENCOUNTER — Other Ambulatory Visit: Payer: Self-pay

## 2019-01-28 DIAGNOSIS — R200 Anesthesia of skin: Secondary | ICD-10-CM

## 2019-01-28 DIAGNOSIS — G629 Polyneuropathy, unspecified: Secondary | ICD-10-CM

## 2019-01-28 DIAGNOSIS — R202 Paresthesia of skin: Secondary | ICD-10-CM

## 2019-01-28 DIAGNOSIS — M4726 Other spondylosis with radiculopathy, lumbar region: Secondary | ICD-10-CM

## 2019-01-28 DIAGNOSIS — M79672 Pain in left foot: Secondary | ICD-10-CM

## 2019-01-28 DIAGNOSIS — M79671 Pain in right foot: Secondary | ICD-10-CM

## 2019-01-28 DIAGNOSIS — G894 Chronic pain syndrome: Secondary | ICD-10-CM

## 2019-01-28 DIAGNOSIS — G608 Other hereditary and idiopathic neuropathies: Secondary | ICD-10-CM

## 2019-01-28 MED ORDER — ALPHA-LIPOIC ACID 600 MG PO CAPS: 600.0000 mg | ORAL_CAPSULE | Freq: Every day | ORAL | 2 refills | Status: DC
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
## 2019-02-03 ENCOUNTER — Telehealth: Payer: Self-pay | Admitting: Student in an Organized Health Care Education/Training Program

## 2019-02-03 NOTE — Telephone Encounter (Signed)
Noted and will forward to him.

## 2019-02-03 NOTE — Telephone Encounter (Signed)
Patient saw Dr. Melrose Nakayama and was put on Tramadol 50mg  3x day. Dr. Melrose Nakayama told patient to be sure to notify Dr. Holley Raring.

## 2019-02-16 ENCOUNTER — Emergency Department: Payer: Medicare Other

## 2019-02-16 ENCOUNTER — Other Ambulatory Visit: Payer: Self-pay

## 2019-02-16 ENCOUNTER — Inpatient Hospital Stay
Admission: EM | Admit: 2019-02-16 | Discharge: 2019-02-20 | DRG: 640 | Disposition: A | Discharge status: Home Health | Payer: Medicare Other | Attending: Internal Medicine | Admitting: Internal Medicine

## 2019-02-16 DIAGNOSIS — I10 Essential (primary) hypertension: Secondary | ICD-10-CM | POA: Diagnosis present

## 2019-02-16 DIAGNOSIS — E538 Deficiency of other specified B group vitamins: Secondary | ICD-10-CM | POA: Diagnosis present

## 2019-02-16 DIAGNOSIS — R4182 Altered mental status, unspecified: Secondary | ICD-10-CM | POA: Diagnosis present

## 2019-02-16 DIAGNOSIS — L89152 Pressure ulcer of sacral region, stage 2: Secondary | ICD-10-CM | POA: Diagnosis present

## 2019-02-16 DIAGNOSIS — I252 Old myocardial infarction: Secondary | ICD-10-CM

## 2019-02-16 DIAGNOSIS — E785 Hyperlipidemia, unspecified: Secondary | ICD-10-CM | POA: Diagnosis present

## 2019-02-16 DIAGNOSIS — E43 Unspecified severe protein-calorie malnutrition: Secondary | ICD-10-CM | POA: Diagnosis present

## 2019-02-16 DIAGNOSIS — Z20828 Contact with and (suspected) exposure to other viral communicable diseases: Secondary | ICD-10-CM | POA: Diagnosis present

## 2019-02-16 DIAGNOSIS — R683 Clubbing of fingers: Secondary | ICD-10-CM | POA: Diagnosis not present

## 2019-02-16 DIAGNOSIS — Q231 Congenital insufficiency of aortic valve: Secondary | ICD-10-CM | POA: Diagnosis not present

## 2019-02-16 DIAGNOSIS — F17210 Nicotine dependence, cigarettes, uncomplicated: Secondary | ICD-10-CM | POA: Diagnosis present

## 2019-02-16 DIAGNOSIS — Z888 Allergy status to other drugs, medicaments and biological substances status: Secondary | ICD-10-CM

## 2019-02-16 DIAGNOSIS — F1290 Cannabis use, unspecified, uncomplicated: Secondary | ICD-10-CM | POA: Diagnosis present

## 2019-02-16 DIAGNOSIS — E871 Hypo-osmolality and hyponatremia: Secondary | ICD-10-CM | POA: Diagnosis present

## 2019-02-16 DIAGNOSIS — R531 Weakness: Secondary | ICD-10-CM | POA: Diagnosis not present

## 2019-02-16 DIAGNOSIS — D649 Anemia, unspecified: Secondary | ICD-10-CM | POA: Diagnosis not present

## 2019-02-16 DIAGNOSIS — J449 Chronic obstructive pulmonary disease, unspecified: Secondary | ICD-10-CM | POA: Diagnosis present

## 2019-02-16 DIAGNOSIS — E559 Vitamin D deficiency, unspecified: Secondary | ICD-10-CM | POA: Diagnosis present

## 2019-02-16 DIAGNOSIS — F329 Major depressive disorder, single episode, unspecified: Secondary | ICD-10-CM | POA: Diagnosis present

## 2019-02-16 DIAGNOSIS — R634 Abnormal weight loss: Secondary | ICD-10-CM | POA: Diagnosis present

## 2019-02-16 DIAGNOSIS — Z8711 Personal history of peptic ulcer disease: Secondary | ICD-10-CM

## 2019-02-16 DIAGNOSIS — I959 Hypotension, unspecified: Secondary | ICD-10-CM | POA: Diagnosis present

## 2019-02-16 DIAGNOSIS — Z7982 Long term (current) use of aspirin: Secondary | ICD-10-CM

## 2019-02-16 DIAGNOSIS — D72829 Elevated white blood cell count, unspecified: Secondary | ICD-10-CM

## 2019-02-16 DIAGNOSIS — Z96642 Presence of left artificial hip joint: Secondary | ICD-10-CM | POA: Diagnosis present

## 2019-02-16 DIAGNOSIS — Z79899 Other long term (current) drug therapy: Secondary | ICD-10-CM | POA: Diagnosis not present

## 2019-02-16 DIAGNOSIS — Z8614 Personal history of Methicillin resistant Staphylococcus aureus infection: Secondary | ICD-10-CM

## 2019-02-16 DIAGNOSIS — D638 Anemia in other chronic diseases classified elsewhere: Secondary | ICD-10-CM | POA: Diagnosis present

## 2019-02-16 DIAGNOSIS — E8809 Other disorders of plasma-protein metabolism, not elsewhere classified: Secondary | ICD-10-CM | POA: Diagnosis not present

## 2019-02-16 DIAGNOSIS — G4730 Sleep apnea, unspecified: Secondary | ICD-10-CM | POA: Diagnosis present

## 2019-02-16 DIAGNOSIS — L8990 Pressure ulcer of unspecified site, unspecified stage: Secondary | ICD-10-CM | POA: Insufficient documentation

## 2019-02-16 DIAGNOSIS — G629 Polyneuropathy, unspecified: Secondary | ICD-10-CM | POA: Diagnosis present

## 2019-02-16 DIAGNOSIS — Z87442 Personal history of urinary calculi: Secondary | ICD-10-CM

## 2019-02-16 DIAGNOSIS — K219 Gastro-esophageal reflux disease without esophagitis: Secondary | ICD-10-CM | POA: Diagnosis not present

## 2019-02-16 DIAGNOSIS — K210 Gastro-esophageal reflux disease with esophagitis: Secondary | ICD-10-CM | POA: Diagnosis present

## 2019-02-16 DIAGNOSIS — R112 Nausea with vomiting, unspecified: Secondary | ICD-10-CM | POA: Diagnosis not present

## 2019-02-16 DIAGNOSIS — R10819 Abdominal tenderness, unspecified site: Secondary | ICD-10-CM | POA: Diagnosis not present

## 2019-02-16 DIAGNOSIS — E876 Hypokalemia: Principal | ICD-10-CM

## 2019-02-16 DIAGNOSIS — R197 Diarrhea, unspecified: Secondary | ICD-10-CM | POA: Diagnosis not present

## 2019-02-16 DIAGNOSIS — Z681 Body mass index (BMI) 19 or less, adult: Secondary | ICD-10-CM | POA: Diagnosis not present

## 2019-02-16 DIAGNOSIS — K529 Noninfective gastroenteritis and colitis, unspecified: Secondary | ICD-10-CM | POA: Diagnosis present

## 2019-02-16 DIAGNOSIS — R109 Unspecified abdominal pain: Secondary | ICD-10-CM | POA: Diagnosis not present

## 2019-02-16 DIAGNOSIS — Z801 Family history of malignant neoplasm of trachea, bronchus and lung: Secondary | ICD-10-CM

## 2019-02-16 LAB — COMPREHENSIVE METABOLIC PANEL
ALT: 6 U/L (ref 0–44)
AST: 13 U/L — ABNORMAL LOW (ref 15–41)
Albumin: 2.5 g/dL — ABNORMAL LOW (ref 3.5–5.0)
Alkaline Phosphatase: 125 U/L (ref 38–126)
Anion gap: 16 — ABNORMAL HIGH (ref 5–15)
BUN: 8 mg/dL (ref 6–20)
CO2: 30 mmol/L (ref 22–32)
Calcium: 8.3 mg/dL — ABNORMAL LOW (ref 8.9–10.3)
Chloride: 81 mmol/L — ABNORMAL LOW (ref 98–111)
Creatinine, Ser: 0.97 mg/dL (ref 0.61–1.24)
GFR calc Af Amer: >60 mL/min (ref >60)
GFR calc non Af Amer: >60 mL/min (ref >60)
Glucose, Bld: 120 mg/dL — ABNORMAL HIGH (ref 70–99)
Potassium: <2 mmol/L — CL (ref 3.5–5.1)
Sodium: 127 mmol/L — ABNORMAL LOW (ref 135–145)
Total Bilirubin: 1 mg/dL (ref 0.3–1.2)
Total Protein: 7.2 g/dL (ref 6.5–8.1)

## 2019-02-16 LAB — CBC WITH DIFFERENTIAL/PLATELET
Abs Immature Granulocytes: 0.11 10*3/uL — ABNORMAL HIGH (ref 0.00–0.07)
Basophils Absolute: 0 10*3/uL (ref 0.0–0.1)
Basophils Relative: 0 %
Eosinophils Absolute: 0.1 10*3/uL (ref 0.0–0.5)
Eosinophils Relative: 0 %
HCT: 37.5 % — ABNORMAL LOW (ref 39.0–52.0)
Hemoglobin: 13 g/dL (ref 13.0–17.0)
Immature Granulocytes: 1 %
Lymphocytes Relative: 11 %
Lymphs Abs: 2.1 10*3/uL (ref 0.7–4.0)
MCH: 30.1 pg (ref 26.0–34.0)
MCHC: 34.7 g/dL (ref 30.0–36.0)
MCV: 86.8 fL (ref 80.0–100.0)
Monocytes Absolute: 0.9 10*3/uL (ref 0.1–1.0)
Monocytes Relative: 5 %
Neutro Abs: 15.9 10*3/uL — ABNORMAL HIGH (ref 1.7–7.7)
Neutrophils Relative %: 83 %
Platelets: 338 10*3/uL (ref 150–400)
RBC: 4.32 MIL/uL (ref 4.22–5.81)
RDW: 12.9 % (ref 11.5–15.5)
WBC: 19 10*3/uL — ABNORMAL HIGH (ref 4.0–10.5)
nRBC: 0 % (ref 0.0–0.2)

## 2019-02-16 LAB — LACTIC ACID, PLASMA
Lactic Acid, Venous: 1.7 mmol/L (ref 0.5–1.9)
Lactic Acid, Venous: 1.3 mmol/L (ref 0.5–1.9)

## 2019-02-16 LAB — TECHNOLOGIST SMEAR REVIEW: Plt Morphology: NORMAL

## 2019-02-16 LAB — PROTIME-INR
INR: 1.1 (ref 0.8–1.2)
Prothrombin Time: 14.2 seconds (ref 11.4–15.2)

## 2019-02-16 LAB — TSH: TSH: 1.422 u[IU]/mL (ref 0.350–4.500)

## 2019-02-16 LAB — MAGNESIUM: Magnesium: 1.8 mg/dL (ref 1.7–2.4)

## 2019-02-16 MED ORDER — LEVOCETIRIZINE DIHYDROCHLORIDE 5 MG PO TABS: 5.0000 mg | ORAL_TABLET | Freq: Every evening | ORAL | Status: DC

## 2019-02-16 MED ORDER — DULOXETINE HCL 30 MG PO CPEP
60.0000 mg | ORAL_CAPSULE | Freq: Every day | ORAL | Status: DC
  Administered 2019-02-16 – 2019-02-20 (×5): 60 mg via ORAL
  Filled 2019-02-16 (×5): qty 2

## 2019-02-16 MED ORDER — POTASSIUM CHLORIDE IN NACL 40-0.9 MEQ/L-% IV SOLN
INTRAVENOUS | Status: AC
  Administered 2019-02-16: 21:00:00 75 mL/h via INTRAVENOUS
  Filled 2019-02-16: qty 1000

## 2019-02-16 MED ORDER — ASPIRIN EC 81 MG PO TBEC
81.0000 mg | DELAYED_RELEASE_TABLET | Freq: Every day | ORAL | Status: DC
  Administered 2019-02-16 – 2019-02-20 (×5): 81 mg via ORAL
  Filled 2019-02-16 (×5): qty 1

## 2019-02-16 MED ORDER — METRONIDAZOLE IN NACL 5-0.79 MG/ML-% IV SOLN
500.0000 mg | Freq: Three times a day (TID) | INTRAVENOUS | Status: DC
  Administered 2019-02-16 – 2019-02-20 (×12): 500 mg via INTRAVENOUS
  Filled 2019-02-16 (×14): qty 100

## 2019-02-16 MED ORDER — ATORVASTATIN CALCIUM 20 MG PO TABS
20.0000 mg | ORAL_TABLET | Freq: Every day | ORAL | Status: DC
  Administered 2019-02-16 – 2019-02-20 (×5): 20 mg via ORAL
  Filled 2019-02-16 (×5): qty 1

## 2019-02-16 MED ORDER — LORATADINE 10 MG PO TABS: 10.0000 mg | ORAL_TABLET | Freq: Every day | ORAL | Status: DC

## 2019-02-16 MED ORDER — POTASSIUM CHLORIDE 10 MEQ/100ML IV SOLN
10.0000 meq | INTRAVENOUS | Status: AC
  Administered 2019-02-16 (×2): 10 meq via INTRAVENOUS
  Filled 2019-02-16 (×2): qty 100

## 2019-02-16 MED ORDER — SODIUM CHLORIDE 0.9 % IV BOLUS: 500.0000 mL | Freq: Once | INTRAVENOUS | Status: DC

## 2019-02-16 MED ORDER — PANTOPRAZOLE SODIUM 40 MG IV SOLR: 40.0000 mg | INTRAVENOUS | Status: DC

## 2019-02-16 MED ORDER — POTASSIUM CHLORIDE 20 MEQ/15ML (10%) PO SOLN
20.0000 meq | Freq: Once | ORAL | Status: AC
  Administered 2019-02-16: 16:00:00 20 meq via ORAL
  Filled 2019-02-16: qty 15

## 2019-02-16 MED ORDER — IOHEXOL 300 MG/ML  SOLN
100.0000 mL | Freq: Once | INTRAMUSCULAR | Status: AC | PRN
  Administered 2019-02-16: 17:00:00 100 mL via INTRAVENOUS

## 2019-02-16 MED ORDER — ONDANSETRON HCL 4 MG/2ML IJ SOLN: 4.0000 mg | Freq: Four times a day (QID) | INTRAMUSCULAR | Status: DC | PRN

## 2019-02-16 MED ORDER — SODIUM CHLORIDE 0.9 % IV SOLN
INTRAVENOUS | Status: DC | PRN
  Administered 2019-02-16 – 2019-02-17 (×3): 250 mL via INTRAVENOUS
  Administered 2019-02-19: 30 mL via INTRAVENOUS
  Administered 2019-02-19: 250 mL via INTRAVENOUS
  Administered 2019-02-19 – 2019-02-20 (×4): 30 mL via INTRAVENOUS

## 2019-02-16 MED ORDER — PREGABALIN 50 MG PO CAPS
100.0000 mg | ORAL_CAPSULE | Freq: Three times a day (TID) | ORAL | Status: DC
  Administered 2019-02-16 – 2019-02-20 (×11): 100 mg via ORAL
  Filled 2019-02-16 (×11): qty 2

## 2019-02-16 MED ORDER — CIPROFLOXACIN IN D5W 400 MG/200ML IV SOLN
400.0000 mg | Freq: Two times a day (BID) | INTRAVENOUS | Status: DC
  Administered 2019-02-16 – 2019-02-20 (×8): 400 mg via INTRAVENOUS
  Filled 2019-02-16 (×9): qty 200

## 2019-02-16 MED ORDER — SODIUM CHLORIDE 0.9 % IV BOLUS
500.0000 mL | Freq: Once | INTRAVENOUS | Status: AC
  Administered 2019-02-16: 16:00:00 500 mL via INTRAVENOUS

## 2019-02-16 MED ORDER — HEPARIN SODIUM (PORCINE) 5000 UNIT/ML IJ SOLN
5000.0000 [IU] | Freq: Three times a day (TID) | INTRAMUSCULAR | Status: DC
  Administered 2019-02-16 – 2019-02-20 (×12): 5000 [IU] via SUBCUTANEOUS
  Filled 2019-02-16 (×12): qty 1

## 2019-02-16 MED ORDER — POTASSIUM CHLORIDE CRYS ER 20 MEQ PO TBCR
20.0000 meq | EXTENDED_RELEASE_TABLET | Freq: Two times a day (BID) | ORAL | Status: DC
  Administered 2019-02-16: 21:00:00 20 meq via ORAL
  Filled 2019-02-16: qty 1

## 2019-02-16 MED ORDER — ONDANSETRON HCL 4 MG PO TABS: 4.0000 mg | ORAL_TABLET | Freq: Three times a day (TID) | ORAL | Status: DC | PRN

## 2019-02-16 MED ORDER — DOCUSATE SODIUM 100 MG PO CAPS: 100.0000 mg | ORAL_CAPSULE | Freq: Two times a day (BID) | ORAL | Status: DC | PRN

## 2019-02-16 MED ORDER — TIZANIDINE HCL 2 MG PO TABS
2.0000 mg | ORAL_TABLET | Freq: Three times a day (TID) | ORAL | Status: DC | PRN
  Administered 2019-02-16: 22:00:00 2 mg via ORAL
  Filled 2019-02-16 (×2): qty 1

## 2019-02-16 MED ORDER — FAMOTIDINE IN NACL 20-0.9 MG/50ML-% IV SOLN
20.0000 mg | INTRAVENOUS | Status: DC
  Administered 2019-02-16 – 2019-02-19 (×4): 20 mg via INTRAVENOUS
  Filled 2019-02-16 (×4): qty 50

## 2019-02-16 MED ORDER — PANTOPRAZOLE SODIUM 40 MG PO TBEC: 40.0000 mg | DELAYED_RELEASE_TABLET | Freq: Every day | ORAL | Status: DC
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
## 2019-02-16 NOTE — Plan of Care (Signed)
Patient is very weak and has not walked in a couple of weeks.

## 2019-02-16 NOTE — ED Notes (Signed)
1st set of blood cultures drawn in triage by this RN, second set will be obtain when patient is roomed shortly.

## 2019-02-16 NOTE — ED Notes (Signed)
Date and time results received: 02/16/19 1602  (use smartphrase ".now" to insert current time)  Test: K Critical Value: <2.0  Name of Provider Notified: Dr. Jacqualine Code  Orders Received? Or Actions Taken?: NA
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
## 2019-02-16 NOTE — Progress Notes (Signed)
Family Meeting Note  Advance Directive:yes  Today a meeting took place with the Patient and cousin ( POA).   The following clinical team members were present during this meeting:MD  The following were discussed:Patient's diagnosis: Generalized weakness, weight loss, elevated white blood cell count, colitis, diarrhea and vomiting, malnutrition, Patient's progosis: Unable to determine and Goals for treatment: Full Code  Additional follow-up to be provided: Oncology and infectious disease  Time spent during discussion:20 minutes  Vaughan Basta, MD
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
## 2019-02-16 NOTE — ED Notes (Signed)
Pt tried to urinate, no success. This RN  Performed in and out cath, no urine obtained from this procedure.

## 2019-02-16 NOTE — ED Triage Notes (Addendum)
Pt arrived via POV with wife, pt states "I feel like my insides are shutting down" pt states this is the worst he has felt in a month.   Wife with patient states he had some confusion last night as well.   Wife states they have seen PCP, Neuro, cardio and GI but cannot figure out what is wrong with him.   Pt does appear pale. Pt also noted his urine is dark in color.   Pt reports nausea and vomiting off and on for the past month as well as significant weight loss.

## 2019-02-17 DIAGNOSIS — R531 Weakness: Secondary | ICD-10-CM

## 2019-02-17 DIAGNOSIS — F17210 Nicotine dependence, cigarettes, uncomplicated: Secondary | ICD-10-CM

## 2019-02-17 DIAGNOSIS — E871 Hypo-osmolality and hyponatremia: Secondary | ICD-10-CM

## 2019-02-17 DIAGNOSIS — D649 Anemia, unspecified: Secondary | ICD-10-CM

## 2019-02-17 DIAGNOSIS — E876 Hypokalemia: Principal | ICD-10-CM

## 2019-02-17 DIAGNOSIS — F329 Major depressive disorder, single episode, unspecified: Secondary | ICD-10-CM

## 2019-02-17 DIAGNOSIS — R112 Nausea with vomiting, unspecified: Secondary | ICD-10-CM

## 2019-02-17 DIAGNOSIS — F419 Anxiety disorder, unspecified: Secondary | ICD-10-CM

## 2019-02-17 DIAGNOSIS — Z888 Allergy status to other drugs, medicaments and biological substances status: Secondary | ICD-10-CM

## 2019-02-17 DIAGNOSIS — K529 Noninfective gastroenteritis and colitis, unspecified: Secondary | ICD-10-CM

## 2019-02-17 DIAGNOSIS — R634 Abnormal weight loss: Secondary | ICD-10-CM

## 2019-02-17 DIAGNOSIS — K219 Gastro-esophageal reflux disease without esophagitis: Secondary | ICD-10-CM

## 2019-02-17 DIAGNOSIS — E8809 Other disorders of plasma-protein metabolism, not elsewhere classified: Secondary | ICD-10-CM

## 2019-02-17 DIAGNOSIS — Z881 Allergy status to other antibiotic agents status: Secondary | ICD-10-CM

## 2019-02-17 DIAGNOSIS — Z96642 Presence of left artificial hip joint: Secondary | ICD-10-CM

## 2019-02-17 DIAGNOSIS — R683 Clubbing of fingers: Secondary | ICD-10-CM

## 2019-02-17 DIAGNOSIS — R109 Unspecified abdominal pain: Secondary | ICD-10-CM

## 2019-02-17 DIAGNOSIS — I10 Essential (primary) hypertension: Secondary | ICD-10-CM

## 2019-02-17 DIAGNOSIS — G629 Polyneuropathy, unspecified: Secondary | ICD-10-CM

## 2019-02-17 DIAGNOSIS — D72829 Elevated white blood cell count, unspecified: Secondary | ICD-10-CM

## 2019-02-17 LAB — URINALYSIS, COMPLETE (UACMP) WITH MICROSCOPIC
Bacteria, UA: NONE SEEN
Bilirubin Urine: NEGATIVE
Glucose, UA: NEGATIVE mg/dL
Hgb urine dipstick: NEGATIVE
Ketones, ur: NEGATIVE mg/dL
Leukocytes,Ua: NEGATIVE
Nitrite: NEGATIVE
Protein, ur: NEGATIVE mg/dL
Specific Gravity, Urine: 1.013 (ref 1.005–1.030)
pH: 7 (ref 5.0–8.0)

## 2019-02-17 LAB — CBC
HCT: 27 % — ABNORMAL LOW (ref 39.0–52.0)
Hemoglobin: 9.3 g/dL — ABNORMAL LOW (ref 13.0–17.0)
MCH: 31 pg (ref 26.0–34.0)
MCHC: 34.4 g/dL (ref 30.0–36.0)
MCV: 90 fL (ref 80.0–100.0)
Platelets: 190 10*3/uL (ref 150–400)
RBC: 3 MIL/uL — ABNORMAL LOW (ref 4.22–5.81)
RDW: 13 % (ref 11.5–15.5)
WBC: 8.4 10*3/uL (ref 4.0–10.5)
nRBC: 0 % (ref 0.0–0.2)

## 2019-02-17 LAB — NOVEL CORONAVIRUS, NAA (HOSP ORDER, SEND-OUT TO REF LAB; TAT 18-24 HRS): SARS-CoV-2, NAA: NOT DETECTED

## 2019-02-17 LAB — BASIC METABOLIC PANEL
Anion gap: 8 (ref 5–15)
BUN: 7 mg/dL (ref 6–20)
CO2: 29 mmol/L (ref 22–32)
Calcium: 7.3 mg/dL — ABNORMAL LOW (ref 8.9–10.3)
Chloride: 94 mmol/L — ABNORMAL LOW (ref 98–111)
Creatinine, Ser: 0.86 mg/dL (ref 0.61–1.24)
GFR calc Af Amer: >60 mL/min (ref >60)
GFR calc non Af Amer: >60 mL/min (ref >60)
Glucose, Bld: 93 mg/dL (ref 70–99)
Potassium: 2.2 mmol/L — CL (ref 3.5–5.1)
Sodium: 131 mmol/L — ABNORMAL LOW (ref 135–145)

## 2019-02-17 LAB — MAGNESIUM: Magnesium: 1.6 mg/dL — ABNORMAL LOW (ref 1.7–2.4)

## 2019-02-17 LAB — POTASSIUM: Potassium: 3 mmol/L — ABNORMAL LOW (ref 3.5–5.1)

## 2019-02-17 LAB — PHOSPHORUS: Phosphorus: 1.7 mg/dL — ABNORMAL LOW (ref 2.5–4.6)

## 2019-02-17 LAB — IRON AND TIBC: Iron: 51 ug/dL (ref 45–182)

## 2019-02-17 LAB — FERRITIN: Ferritin: 509 ng/mL — ABNORMAL HIGH (ref 24–336)

## 2019-02-17 LAB — VITAMIN B12: Vitamin B-12: 321 pg/mL (ref 180–914)

## 2019-02-17 LAB — PATHOLOGIST SMEAR REVIEW

## 2019-02-17 MED ORDER — POTASSIUM CHLORIDE IN NACL 40-0.9 MEQ/L-% IV SOLN
INTRAVENOUS | Status: AC
  Filled 2019-02-17: qty 1000

## 2019-02-17 MED ORDER — MAGNESIUM SULFATE 2 GM/50ML IV SOLN
2.0000 g | Freq: Once | INTRAVENOUS | Status: AC
  Administered 2019-02-17: 11:00:00 2 g via INTRAVENOUS
  Filled 2019-02-17: qty 50

## 2019-02-17 MED ORDER — POTASSIUM CHLORIDE CRYS ER 20 MEQ PO TBCR
40.0000 meq | EXTENDED_RELEASE_TABLET | Freq: Three times a day (TID) | ORAL | Status: DC
  Administered 2019-02-17 (×3): 40 meq via ORAL
  Filled 2019-02-17 (×3): qty 2

## 2019-02-17 MED ORDER — ADULT MULTIVITAMIN W/MINERALS CH
1.0000 | ORAL_TABLET | Freq: Every day | ORAL | Status: DC
  Administered 2019-02-18 – 2019-02-20 (×3): 1 via ORAL
  Filled 2019-02-17 (×3): qty 1

## 2019-02-17 MED ORDER — ENSURE ENLIVE PO LIQD
237.0000 mL | Freq: Three times a day (TID) | ORAL | Status: DC
  Administered 2019-02-17: 16:00:00 237 mL via ORAL

## 2019-02-17 MED ORDER — POTASSIUM PHOSPHATES 15 MMOLE/5ML IV SOLN
30.0000 mmol | Freq: Once | INTRAVENOUS | Status: AC
  Administered 2019-02-17: 16:00:00 30 mmol via INTRAVENOUS
  Filled 2019-02-17: qty 10

## 2019-02-17 MED ORDER — POTASSIUM CHLORIDE IN NACL 40-0.9 MEQ/L-% IV SOLN
INTRAVENOUS | Status: DC
  Administered 2019-02-17: 14:00:00 50 mL/h via INTRAVENOUS
  Filled 2019-02-17 (×2): qty 1000

## 2019-02-17 MED ORDER — SODIUM CHLORIDE 0.9 % IV BOLUS
250.0000 mL | Freq: Once | INTRAVENOUS | Status: AC
  Administered 2019-02-17: 06:00:00 250 mL via INTRAVENOUS

## 2019-02-17 MED ORDER — SODIUM CHLORIDE 0.9 % IV BOLUS
250.0000 mL | Freq: Once | INTRAVENOUS | Status: AC
  Administered 2019-02-17: 15:00:00 250 mL via INTRAVENOUS

## 2019-02-17 MED ORDER — POTASSIUM CHLORIDE 10 MEQ/100ML IV SOLN
10.0000 meq | INTRAVENOUS | Status: DC
  Administered 2019-02-17: 14:00:00 10 meq via INTRAVENOUS
  Filled 2019-02-17: qty 100

## 2019-02-17 MED ORDER — HYDROCODONE-ACETAMINOPHEN 5-325 MG PO TABS
1.0000 | ORAL_TABLET | Freq: Four times a day (QID) | ORAL | Status: DC | PRN
  Administered 2019-02-18: 1 via ORAL
  Filled 2019-02-17: qty 1

## 2019-02-17 NOTE — Progress Notes (Addendum)
Initial Nutrition Assessment  DOCUMENTATION CODES:   Severe malnutrition in context of chronic illness  INTERVENTION:   Ensure Enlive po TID, each supplement provides 350 kcal and 20 grams of protein  Magic cup TID with meals, each supplement provides 290 kcal and 9 grams of protein  MVI daily   Check thiamine, copper, B6, vitamin D, zinc and ascorbic acid labs  Pt at high refeed risk; recommend monitor K, Mg and P labs daily   NUTRITION DIAGNOSIS:   Severe Malnutrition related to chronic illness(nausea, vomiting, poor appetite, COPD) as evidenced by 29 percent weight loss in one year, severe muscle depletion, severe fat depletion.  GOAL:   Patient will meet greater than or equal to 90% of their needs  MONITOR:   PO intake, Supplement acceptance, Labs, Weight trends, Skin, I & O's  REASON FOR ASSESSMENT:   Consult Assessment of nutrition requirement/status  ASSESSMENT:   53 y.o. male with a known history of anemia, aortic valve disorders, arthritis, asthma, COPD, depression, gastroesophageal reflux disease, myocardial infarction, sleep apnea, peptic ulcer disease with multiple esophageal dilations admitted with weakness, nausea and vomiting   Met with pt in room today, pt reports intermittent nausea and vomiting over the past year. Pt reports vomiting is not always associated with meals. Pt reports that sometimes he will not vomit until hours after eating. Pt reports that he does not vomit up food but mainly phlegm. Pt reports that he has had episodes like this in the past that were resolved with esophogeal dilation. Pt reports severely decreased appetite and oral intake over the past few months. Pt does drink Ensure at home but not regularly. Pt has lost 40lbs(29%) over the past year; this is significant. Pt reports easy bruising and increased scaling of skin on his BLE. Pt also reports weakness and tingling in his hands and feet. Pt does have a history neuropathy. RD will  order supplements and vitamins to help pt meet his estimated needs. Pt ate 50% of his lunch today which included salmon and mashed potatoes.   Pt at risk for nutrient deficiencies secondary to chronic PPI use and poor appetite; will send off vitamin labs to r/o deficiency.   Medications reviewed and include: aspirin, heparin, KCl, NaCl w/ KCl '@50ml' /hr, ciprofloxacin, pepcid, metronidazole, Kphos   Labs reviewed: Na 131(L), K 2.2(L), P 1.7(L), Mg 1.6(L) Iron 51, ferritin 509(H), B12 321 Hgb 9.3(L), Hct 27.0(L)  NUTRITION - FOCUSED PHYSICAL EXAM:    Most Recent Value  Orbital Region  Moderate depletion  Upper Arm Region  Severe depletion  Thoracic and Lumbar Region  Severe depletion  Buccal Region  Moderate depletion  Temple Region  Moderate depletion  Clavicle Bone Region  Severe depletion  Clavicle and Acromion Bone Region  Severe depletion  Scapular Bone Region  Moderate depletion  Dorsal Hand  Severe depletion  Patellar Region  Severe depletion  Anterior Thigh Region  Severe depletion  Posterior Calf Region  Severe depletion  Edema (RD Assessment)  None  Hair  Reviewed  Eyes  Reviewed  Mouth  Reviewed  Skin  Scaly skin on BLE  Nails  Reviewed     Diet Order:   Diet Order            Diet regular Room service appropriate? Yes; Fluid consistency: Thin  Diet effective now             EDUCATION NEEDS:   Education needs have been addressed  Skin:  Skin Assessment: Reviewed RN Assessment  Last BM:  8/28  Height:   Ht Readings from Last 1 Encounters:  02/16/19 '5\' 7"'  (1.702 m)    Weight:   Wt Readings from Last 1 Encounters:  02/16/19 45.4 kg    Ideal Body Weight:  67.2 kg  BMI:  Body mass index is 15.66 kg/m.  Estimated Nutritional Needs:   Kcal:  1600-1800kcal/day  Protein:  80-90g/day  Fluid:  >1.4L/day  Koleen Distance MS, RD, LDN Pager #- [phone] Office#- [phone] After Hours Pager: [phone]
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
## 2019-02-17 NOTE — Progress Notes (Signed)
Called Dr. Sidney Ace regarding patient's blood pressure- 89/71. Orders were placed for a 278mL bolus. Will continue to monitor.  Christene Slates 02/17/2019 6:23 AM

## 2019-02-17 NOTE — Progress Notes (Addendum)
Pharmacy Electrolyte Monitoring Consult:  Pharmacy consulted to assist in monitoring and replacing electrolytes in this 53 y.o. male admitted on 02/16/2019 with Weakness and Altered Mental Status malnutrition  Labs:  Sodium (mmol/L)  Date Value  02/17/2019 131 (L)  07/02/2018 142  12/25/2013 138   Potassium (mmol/L)  Date Value  02/17/2019 2.2 (LL)  12/25/2013 2.9 (L)   Magnesium (mg/dL)  Date Value  02/16/2019 1.8   Phosphorus (mg/dL)  Date Value  06/15/2016 3.5   Calcium (mg/dL)  Date Value  02/17/2019 7.3 (L)   Calcium, Total (mg/dL)  Date Value  12/25/2013 8.8   Albumin (g/dL)  Date Value  02/16/2019 2.5 (L)  07/02/2018 3.7    Assessment/Plan: K 2.2  Scr 0.86  (Mag 1.8 on 8/30)  Na 131 Phos 1.7  Mag 1.6 Patient currently ordered NS with KCL 40 meq at 50 ml/hr and KCL 40 meq PO TID.   MD also ordered Magnesium sulfate 2gm IV x1 and now Potassium Phosphate 30 mmol IV x 1 Recheck K at 2200 F/u electrolytes with am labs   Kevin Alexander 02/17/2019 12:10 PM
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
## 2019-02-17 NOTE — Progress Notes (Signed)
Patient c/o Left, mid, lateral abdominal pain; no pain medications ordered at this time; A. Seals, PA notified via secure chat; awaiting acknowledgement/orders. Barbaraann Faster, RN 10:22 PM 02/17/2019

## 2019-02-17 NOTE — Progress Notes (Signed)
Patient ID: Kevin Alexander, male   DOB: 1965/06/23, 53 y.o.   MRN: [id number]  [facility] PROGRESS NOTE  Kevin Alexander [id number] DOB: [birth year] DOA: 02/16/2019 PCP: Sallee Lange, NP  HPI/Subjective: Patient does not feel well.  Came in with weakness.  Also had quite a bit of weight loss.  Had colonoscopy and endoscopy recently.  Endoscopy showing esophagitis and colonoscopy showing polyps.  Patient has had intermittent nausea vomiting diarrhea and abdominal pain.  Objective: Vitals:   02/17/19 1333 02/17/19 1335  BP: (!) 83/58 (!) 82/58  Pulse: 87 88  Resp: 20   Temp: 97.6 F (36.4 C)   SpO2: 99%     Intake/Output Summary (Last 24 hours) at 02/17/2019 1553 Last data filed at 02/17/2019 1209 Gross per 24 hour  Intake 1573.13 ml  Output 1350 ml  Net 223.13 ml   Filed Weights   02/16/19 1517  Weight: 45.4 kg    ROS: Review of Systems  Constitutional: Negative for chills and fever.  Eyes: Negative for blurred vision.  Respiratory: Negative for cough, shortness of breath and wheezing.   Cardiovascular: Negative for chest pain.  Gastrointestinal: Positive for abdominal pain, diarrhea, nausea and vomiting. Negative for constipation.  Genitourinary: Negative for dysuria.  Musculoskeletal: Negative for joint pain.  Neurological: Negative for dizziness and headaches.   Exam: Physical Exam  HENT:  Nose: No mucosal edema.  Mouth/Throat: No oropharyngeal exudate or posterior oropharyngeal edema.  Eyes: Pupils are equal, round, and reactive to light. Conjunctivae, EOM and lids are normal.  Neck: No JVD present. Carotid bruit is not present. No edema present. No thyroid mass and no thyromegaly present.  Cardiovascular: S1 normal and S2 normal. Exam reveals no gallop.  No murmur heard. Pulses:      Dorsalis pedis pulses are 2+ on the right side and 2+ on the left side.  Respiratory: No respiratory distress. He has no wheezes. He has no rhonchi. He has no  rales.  GI: Soft. Bowel sounds are normal. There is abdominal tenderness.  Musculoskeletal:     Right shoulder: He exhibits no swelling.  Lymphadenopathy:    He has no cervical adenopathy.  Neurological: He is alert. No cranial nerve deficit.  Skin: Skin is warm. No rash noted. Nails show no clubbing.  Psychiatric: He has a normal mood and affect.      Data Reviewed: Basic Metabolic Panel: Recent Labs  Lab 02/16/19 1516 02/16/19 1604 02/17/19 0610 02/17/19 0615  NA 127*  --  131*  --   K <2.0*  --  2.2*  --   CL 81*  --  94*  --   CO2 30  --  29  --   GLUCOSE 120*  --  93  --   BUN 8  --  7  --   CREATININE 0.97  --  0.86  --   CALCIUM 8.3*  --  7.3*  --   MG  --  1.8  --  1.6*  PHOS  --   --   --  1.7*   Liver Function Tests: Recent Labs  Lab 02/16/19 1516  AST 13*  ALT 6  ALKPHOS 125  BILITOT 1.0  PROT 7.2  ALBUMIN 2.5*   CBC: Recent Labs  Lab 02/16/19 1516 02/17/19 0610  WBC 19.0* 8.4  NEUTROABS 15.9*  --   HGB 13.0 9.3*  HCT 37.5* 27.0*  MCV 86.8 90.0  PLT 338 190     Recent Results (from the  past 240 hour(s))  Culture, blood (Routine x 2)     Status: None (Preliminary result)   Collection Time: 02/16/19  3:20 PM   Specimen: BLOOD  Result Value Ref Range Status   Specimen Description BLOOD BLOOD LEFT FOREARM  Final   Special Requests   Final    BOTTLES DRAWN AEROBIC AND ANAEROBIC Blood Culture results may not be optimal due to an excessive volume of blood received in culture bottles   Culture   Final    NO GROWTH < 24 HOURS Performed at [facility], [address], [place], [place] [address]    Report Status PENDING  Incomplete  Culture, blood (Routine x 2)     Status: None (Preliminary result)   Collection Time: 02/16/19  3:52 PM   Specimen: BLOOD  Result Value Ref Range Status   Specimen Description BLOOD R AC  Final   Special Requests   Final    BOTTLES DRAWN AEROBIC AND ANAEROBIC Blood Culture results may not be optimal  due to an excessive volume of blood received in culture bottles   Culture   Final    NO GROWTH < 24 HOURS Performed at [facility], [address] [place], [place] [address]    Report Status PENDING  Incomplete  Novel Coronavirus, NAA (Hosp order, Send-out to Ref Lab; TAT 18-24 hrs     Status: None   Collection Time: 02/16/19  5:41 PM   Specimen: Nasopharyngeal Swab; Respiratory  Result Value Ref Range Status   SARS-CoV-2, NAA NOT DETECTED NOT DETECTED Final    Comment: (NOTE) This test was developed and its performance characteristics determined by [employer]. This test has not been FDA cleared or approved. This test has been authorized by FDA under an Emergency Use Authorization (EUA). This test is only authorized for the duration of time the declaration that circumstances exist justifying the authorization of the emergency use of in vitro diagnostic tests for detection of SARS-CoV-2 virus and/or diagnosis of COVID-19 infection under section 564(b)(1) of the Act, 21 U.S.C. [id number]), unless the authorization is terminated or revoked sooner. When diagnostic testing is negative, the possibility of a false negative result should be considered in the context of a patient's recent exposures and the presence of clinical signs and symptoms consistent with COVID-19. An individual without symptoms of COVID-19 and who is not shedding SARS-CoV-2 virus would expect to have a negative (not detected) result in this assay. Performed  At: [facility] [place], [place] [id number] Rush Farmer MD [id number]    [place]  Final    Comment: Performed at [facility], [place] [place],  [address]     Studies: Ct Head Wo Contrast  Result Date: 02/16/2019 CLINICAL DATA:  Confusion.  Weight loss. EXAM: CT HEAD WITHOUT CONTRAST TECHNIQUE: Contiguous axial images were obtained from the base of the  skull through the vertex without intravenous contrast. COMPARISON:  None. FINDINGS: Brain: No subdural, epidural, or subarachnoid hemorrhage identified. There is encephalomalacia in the medial inferior left frontal lobe on series 2, image 79 and sagittal image 29 consistent with a previous infarct. No other infarcts are identified. No acute ischemia is noted. No mass effect or midline shift. Ventricles and sulci are normal. Cerebellum, brainstem, and basal cisterns are normal. Vascular: No hyperdense vessel or unexpected calcification. Skull: Normal. Negative for fracture or focal lesion. Sinuses/Orbits: No acute finding. Other: None. IMPRESSION: 1. No acute intracranial abnormalities identified. There is a  small region of encephalomalacia in the inferior medial left frontal lobe consistent with a prior infarct. Electronically Signed   By: Dorise Bullion III M.D   On: 02/16/2019 17:11   Ct Chest W Contrast  Result Date: 02/16/2019 CLINICAL DATA:  Confusion. Muscle weakness. Altered mental status. Pallor. Nausea and vomiting. EXAM: CT CHEST, ABDOMEN, AND PELVIS WITH CONTRAST TECHNIQUE: Multidetector CT imaging of the chest, abdomen and pelvis was performed following the standard protocol during bolus administration of intravenous contrast. CONTRAST:  112mL OMNIPAQUE IOHEXOL 300 MG/ML  SOLN COMPARISON:  Multiple exams, including CT pelvis 05/31/2018; CT chest from 04/26/2017; CT abdomen from 08/23/2015 FINDINGS: CT CHEST FINDINGS Cardiovascular: Unremarkable Mediastinum/Nodes: AP window lymph node, 1.0 cm in short axis on image 22/2, formerly the same by my measurements. Right hilar lymph node 0.9 cm in short axis on image 26/2, stable. Left para-aortic lymph node 0.5 cm in short axis on image 42/2, stable. Lungs/Pleura: Paraseptal emphysema. Bilateral airway thickening. Small calcified granuloma in the right upper lobe on image 30/4. Musculoskeletal: Unremarkable CT ABDOMEN PELVIS FINDINGS Hepatobiliary: Old  granulomatous disease. Gallbladder unremarkable. Mild focal steatosis along the falciform ligament. Pancreas: Unremarkable Spleen: Unremarkable Adrenals/Urinary Tract: 2 mm right kidney lower pole nonobstructive renal calculus on image 54/5. Adrenal glands normal. The kidneys appear otherwise normal. Left-sided urinary bladder at left distal ureter obscured by streak artifact from the patient's left hip implant. Stomach/Bowel: Wall thickening in the ascending colon. Normal appendix. There are few scattered air-fluid levels in nondilated small bowel loops. Vascular/Lymphatic: Mild iliac artery atherosclerotic calcification. No pathologic adenopathy in the abdomen. Reproductive: Unremarkable. Other: No supplemental non-categorized findings. Musculoskeletal: Hernia mesh along the lower pelvic anterior abdominal wall. Left total hip prosthesis. Transitional lumbosacral vertebra. IMPRESSION: 1. Wall thickening in the ascending colon suspicious for colitis. Normal appendix. 2. There are a few scattered air-fluid levels in nondilated small bowel, strictly speaking, antritis is not excluded. No gas in the bowel wall or extraluminal gas. 3.  Emphysema (ICD10-J43.9). 4. Stable upper normal sized right hilar and AP window lymph nodes. 5. Airway thickening is present, suggesting bronchitis or reactive airways disease. 6. Nonobstructive right nephrolithiasis. Electronically Signed   By: Van Clines M.D.   On: 02/16/2019 17:21   Ct Abdomen Pelvis W Contrast  Result Date: 02/16/2019 CLINICAL DATA:  Confusion. Muscle weakness. Altered mental status. Pallor. Nausea and vomiting. EXAM: CT CHEST, ABDOMEN, AND PELVIS WITH CONTRAST TECHNIQUE: Multidetector CT imaging of the chest, abdomen and pelvis was performed following the standard protocol during bolus administration of intravenous contrast. CONTRAST:  134mL OMNIPAQUE IOHEXOL 300 MG/ML  SOLN COMPARISON:  Multiple exams, including CT pelvis 05/31/2018; CT chest from  04/26/2017; CT abdomen from 08/23/2015 FINDINGS: CT CHEST FINDINGS Cardiovascular: Unremarkable Mediastinum/Nodes: AP window lymph node, 1.0 cm in short axis on image 22/2, formerly the same by my measurements. Right hilar lymph node 0.9 cm in short axis on image 26/2, stable. Left para-aortic lymph node 0.5 cm in short axis on image 42/2, stable. Lungs/Pleura: Paraseptal emphysema. Bilateral airway thickening. Small calcified granuloma in the right upper lobe on image 30/4. Musculoskeletal: Unremarkable CT ABDOMEN PELVIS FINDINGS Hepatobiliary: Old granulomatous disease. Gallbladder unremarkable. Mild focal steatosis along the falciform ligament. Pancreas: Unremarkable Spleen: Unremarkable Adrenals/Urinary Tract: 2 mm right kidney lower pole nonobstructive renal calculus on image 54/5. Adrenal glands normal. The kidneys appear otherwise normal. Left-sided urinary bladder at left distal ureter obscured by streak artifact from the patient's left hip implant. Stomach/Bowel: Wall thickening in the ascending colon. Normal appendix.  There are few scattered air-fluid levels in nondilated small bowel loops. Vascular/Lymphatic: Mild iliac artery atherosclerotic calcification. No pathologic adenopathy in the abdomen. Reproductive: Unremarkable. Other: No supplemental non-categorized findings. Musculoskeletal: Hernia mesh along the lower pelvic anterior abdominal wall. Left total hip prosthesis. Transitional lumbosacral vertebra. IMPRESSION: 1. Wall thickening in the ascending colon suspicious for colitis. Normal appendix. 2. There are a few scattered air-fluid levels in nondilated small bowel, strictly speaking, antritis is not excluded. No gas in the bowel wall or extraluminal gas. 3.  Emphysema (ICD10-J43.9). 4. Stable upper normal sized right hilar and AP window lymph nodes. 5. Airway thickening is present, suggesting bronchitis or reactive airways disease. 6. Nonobstructive right nephrolithiasis. Electronically Signed    By: Van Clines M.D.   On: 02/16/2019 17:21    Scheduled Meds: . aspirin EC  81 mg Oral Daily  . atorvastatin  20 mg Oral Daily  . DULoxetine  60 mg Oral Daily  . feeding supplement (ENSURE ENLIVE)  237 mL Oral TID BM  . heparin  5,000 Units Subcutaneous Q8H  . [START ON 02/18/2019] multivitamin with minerals  1 tablet Oral Daily  . potassium chloride  40 mEq Oral TID  . pregabalin  100 mg Oral TID   Continuous Infusions: . sodium chloride 250 mL (02/17/19 1114)  . 0.9 % NaCl with KCl 40 mEq / L 50 mL/hr (02/17/19 1332)  . ciprofloxacin 400 mg (02/17/19 0618)  . famotidine (PEPCID) IV Stopped (02/16/19 2220)  . metronidazole Stopped (02/17/19 0316)  . potassium PHOSPHATE IVPB (in mmol)      Assessment/Plan:  1. Severe hypokalemia, hypophosphatemia and hypomagnesemia.  Replace phosphorus with K-Phos.  Replace magnesium with IV magnesium replace potassium orally and IV.  Pharmacy to help with replacement of electrolytes.  Check for other vitamin deficiencies. 2. Relative hypotension.  Discontinue Zanaflex.  Fluid bolus. 3. Generalized weakness and weight loss.  CT scan does not show any signs of malignancy.  Could be some sort of malabsorptive disorder. 4. Colitis seen on CT scan.  Jolie bodies seen on CBC.  Empiric antibiotics with Cipro and Flagyl 5. Hyperlipidemia unspecified.  Hold atorvastatin 6. Depression on Cymbalta 7. Neuropathy on Lyrica  Code Status:     Code Status Orders  (From admission, onward)         Start     Ordered   02/16/19 1848  Full code  Continuous     02/16/19 1847        Code Status History    Date Active Date Inactive Code Status Order ID Comments User Context   09/21/2016 1512 09/23/2016 2135 Full Code [id number]  Epifanio Lesches, MD ED   06/15/2016 2015 06/19/2016 2007 Full Code [id number]  Demetrios Loll, MD Inpatient   Advance Care Planning Activity     Disposition Plan: To be determined  Consultants:  Infectious  disease  Antibiotics:  Cipro  Flagyl  Time spent: 28 minutes  [place]

## 2019-02-18 DIAGNOSIS — Q231 Congenital insufficiency of aortic valve: Secondary | ICD-10-CM

## 2019-02-18 DIAGNOSIS — E559 Vitamin D deficiency, unspecified: Secondary | ICD-10-CM

## 2019-02-18 DIAGNOSIS — L8990 Pressure ulcer of unspecified site, unspecified stage: Secondary | ICD-10-CM | POA: Insufficient documentation

## 2019-02-18 DIAGNOSIS — E538 Deficiency of other specified B group vitamins: Secondary | ICD-10-CM

## 2019-02-18 LAB — GASTROINTESTINAL PANEL BY PCR, STOOL (REPLACES STOOL CULTURE)

## 2019-02-18 LAB — PHOSPHORUS: Phosphorus: 1.9 mg/dL — ABNORMAL LOW (ref 2.5–4.6)

## 2019-02-18 LAB — BASIC METABOLIC PANEL
Anion gap: 7 (ref 5–15)
BUN: <5 mg/dL — ABNORMAL LOW (ref 6–20)
CO2: 24 mmol/L (ref 22–32)
Calcium: 7 mg/dL — ABNORMAL LOW (ref 8.9–10.3)
Chloride: 104 mmol/L (ref 98–111)
Creatinine, Ser: 0.74 mg/dL (ref 0.61–1.24)
GFR calc Af Amer: >60 mL/min (ref >60)
GFR calc non Af Amer: >60 mL/min (ref >60)
Glucose, Bld: 88 mg/dL (ref 70–99)
Potassium: 4.1 mmol/L (ref 3.5–5.1)
Sodium: 135 mmol/L (ref 135–145)

## 2019-02-18 LAB — FOLATE: Folate: 2.9 ng/mL — ABNORMAL LOW (ref >5.9)

## 2019-02-18 LAB — LIPASE, BLOOD: Lipase: 47 U/L (ref 11–51)

## 2019-02-18 LAB — CORTISOL: Cortisol, Plasma: 8.4 ug/dL

## 2019-02-18 LAB — LACTATE DEHYDROGENASE: LDH: 75 U/L — ABNORMAL LOW (ref 98–192)

## 2019-02-18 LAB — AMYLASE: Amylase: 49 U/L (ref 28–100)

## 2019-02-18 LAB — C DIFFICILE QUICK SCREEN W PCR REFLEX
C Diff antigen: NEGATIVE
C Diff interpretation: NOT DETECTED
C Diff toxin: NEGATIVE

## 2019-02-18 LAB — C-REACTIVE PROTEIN: CRP: 4.9 mg/dL — ABNORMAL HIGH (ref <1.0)

## 2019-02-18 LAB — HIV ANTIBODY (ROUTINE TESTING W REFLEX): HIV Screen 4th Generation wRfx: NONREACTIVE

## 2019-02-18 LAB — MAGNESIUM: Magnesium: 1.9 mg/dL (ref 1.7–2.4)

## 2019-02-18 LAB — PSA: Prostatic Specific Antigen: 0.69 ng/mL (ref 0.00–4.00)

## 2019-02-18 LAB — SEDIMENTATION RATE: Sed Rate: 65 mm/hr — ABNORMAL HIGH (ref 0–20)

## 2019-02-18 MED ORDER — POTASSIUM CHLORIDE 10 MEQ/100ML IV SOLN
10.0000 meq | INTRAVENOUS | Status: AC
  Administered 2019-02-18 (×3): 10 meq via INTRAVENOUS
  Filled 2019-02-18 (×3): qty 100

## 2019-02-18 MED ORDER — FOLIC ACID 1 MG PO TABS
1.0000 mg | ORAL_TABLET | Freq: Every day | ORAL | Status: DC
  Administered 2019-02-18 – 2019-02-20 (×3): 1 mg via ORAL
  Filled 2019-02-18 (×3): qty 1

## 2019-02-18 MED ORDER — POTASSIUM PHOSPHATES 15 MMOLE/5ML IV SOLN
30.0000 mmol | Freq: Once | INTRAVENOUS | Status: AC
  Administered 2019-02-18: 30 mmol via INTRAVENOUS
  Filled 2019-02-18: qty 10

## 2019-02-18 MED ORDER — POTASSIUM CHLORIDE CRYS ER 20 MEQ PO TBCR: 40.0000 meq | EXTENDED_RELEASE_TABLET | Freq: Every day | ORAL | Status: DC

## 2019-02-18 NOTE — Progress Notes (Signed)
Patient ID: Kevin Alexander, male   DOB: January 19, 1966, 53 y.o.   MRN: [id number]  [facility] PROGRESS NOTE  Kevin Alexander [id number] DOB: 1966-05-23 DOA: 02/16/2019 PCP: Sallee Lange, NP  HPI/Subjective: Patient having less abdominal pain.  Had some diarrhea today.  No nausea or vomiting.  Able to eat some food.  Feels weak.  Feels a little bit better today than yesterday.  Objective: Vitals:   02/18/19 0555 02/18/19 1454  BP: 104/67 97/73  Pulse: 77 84  Resp: 18 16  Temp: 98 F (36.7 C) (!) 97.5 F (36.4 C)  SpO2: 99% 99%    Intake/Output Summary (Last 24 hours) at 02/18/2019 1651 Last data filed at 02/18/2019 1600 Gross per 24 hour  Intake 2299.54 ml  Output 1025 ml  Net 1274.54 ml   Filed Weights   02/16/19 1517  Weight: 45.4 kg    ROS: Review of Systems  Constitutional: Negative for chills and fever.  Eyes: Negative for blurred vision.  Respiratory: Negative for cough and shortness of breath.   Cardiovascular: Negative for chest pain.  Gastrointestinal: Positive for abdominal pain and diarrhea. Negative for constipation, nausea and vomiting.  Genitourinary: Negative for dysuria.  Musculoskeletal: Negative for joint pain.  Neurological: Negative for dizziness and headaches.   Exam: Physical Exam  Constitutional: He is oriented to person, place, and time.  HENT:  Nose: No mucosal edema.  Mouth/Throat: No oropharyngeal exudate or posterior oropharyngeal edema.  Eyes: Pupils are equal, round, and reactive to light. Conjunctivae, EOM and lids are normal.  Neck: No JVD present. Carotid bruit is not present. No edema present. No thyroid mass and no thyromegaly present.  Cardiovascular: S1 normal and S2 normal. Exam reveals no gallop.  No murmur heard. Pulses:      Dorsalis pedis pulses are 2+ on the right side and 2+ on the left side.  Respiratory: No respiratory distress. He has no wheezes. He has no rhonchi. He has no rales.  GI: Soft. Bowel  sounds are normal. There is abdominal tenderness in the epigastric area.  Musculoskeletal:     Right ankle: He exhibits no swelling.     Left ankle: He exhibits no swelling.  Lymphadenopathy:    He has no cervical adenopathy.  Neurological: He is alert and oriented to person, place, and time. No cranial nerve deficit.  Skin: Skin is warm. No rash noted. Nails show no clubbing.  Psychiatric: He has a normal mood and affect.      Data Reviewed: Basic Metabolic Panel: Recent Labs  Lab 02/16/19 1516 02/16/19 1604 02/17/19 0610 02/17/19 0615 02/17/19 2201 02/18/19 0649  NA 127*  --  131*  --   --  135  K <2.0*  --  2.2*  --  3.0* 4.1  CL 81*  --  94*  --   --  104  CO2 30  --  29  --   --  24  GLUCOSE 120*  --  93  --   --  88  BUN 8  --  7  --   --  <5*  CREATININE 0.97  --  0.86  --   --  0.74  CALCIUM 8.3*  --  7.3*  --   --  7.0*  MG  --  1.8  --  1.6*  --  1.9  PHOS  --   --   --  1.7*  --  1.9*   Liver Function Tests: Recent Labs  Lab 02/16/19  1516  AST 13*  ALT 6  ALKPHOS 125  BILITOT 1.0  PROT 7.2  ALBUMIN 2.5*   Recent Labs  Lab 02/18/19 0649  LIPASE 47  AMYLASE 49     CBC: Recent Labs  Lab 02/16/19 1516 02/17/19 0610  WBC 19.0* 8.4  NEUTROABS 15.9*  --   HGB 13.0 9.3*  HCT 37.5* 27.0*  MCV 86.8 90.0  PLT 338 190     Recent Results (from the past 240 hour(s))  Culture, blood (Routine x 2)     Status: None (Preliminary result)   Collection Time: 02/16/19  3:20 PM   Specimen: BLOOD  Result Value Ref Range Status   Specimen Description BLOOD BLOOD LEFT FOREARM  Final   Special Requests   Final    BOTTLES DRAWN AEROBIC AND ANAEROBIC Blood Culture results may not be optimal due to an excessive volume of blood received in culture bottles   Culture   Final    NO GROWTH 2 DAYS Performed at [facility], [address] [place], [place] [address]    Report Status PENDING  Incomplete  Culture, blood (Routine x 2)     Status: None  (Preliminary result)   Collection Time: 02/16/19  3:52 PM   Specimen: BLOOD  Result Value Ref Range Status   Specimen Description BLOOD R AC  Final   Special Requests   Final    BOTTLES DRAWN AEROBIC AND ANAEROBIC Blood Culture results may not be optimal due to an excessive volume of blood received in culture bottles   Culture   Final    NO GROWTH 2 DAYS Performed at [facility], [address], [place], [place] [address]    Report Status PENDING  Incomplete  Novel Coronavirus, NAA (Hosp order, Send-out to Ref Lab; TAT 18-24 hrs     Status: None   Collection Time: 02/16/19  5:41 PM   Specimen: Nasopharyngeal Swab; Respiratory  Result Value Ref Range Status   SARS-CoV-2, NAA NOT DETECTED NOT DETECTED Final    Comment: (NOTE) This test was developed and its performance characteristics determined by [employer]. This test has not been FDA cleared or approved. This test has been authorized by FDA under an Emergency Use Authorization (EUA). This test is only authorized for the duration of time the declaration that circumstances exist justifying the authorization of the emergency use of in vitro diagnostic tests for detection of SARS-CoV-2 virus and/or diagnosis of COVID-19 infection under section 564(b)(1) of the Act, 21 U.S.C. [id number]), unless the authorization is terminated or revoked sooner. When diagnostic testing is negative, the possibility of a false negative result should be considered in the context of a patient's recent exposures and the presence of clinical signs and symptoms consistent with COVID-19. An individual without symptoms of COVID-19 and who is not shedding SARS-CoV-2 virus would expect to have a negative (not detected) result in this assay. Performed  At: [facility] [address] [place], [place] [id number] Rush Farmer MD [id number]    [place]  Final    Comment: Performed at [facility], [address] [place], [place] [address]  C difficile quick scan w PCR reflex     Status: None   Collection Time: 02/18/19  1:50 PM   Specimen: Stool  Result Value Ref Range Status   C Diff antigen NEGATIVE NEGATIVE Final   C Diff toxin NEGATIVE NEGATIVE Final   C Diff interpretation No C. difficile detected.  Final  Comment: Performed at [facility], [address], [place], [place] [address]     Studies: Ct Head Wo Contrast  Result Date: 02/16/2019 CLINICAL DATA:  Confusion.  Weight loss. EXAM: CT HEAD WITHOUT CONTRAST TECHNIQUE: Contiguous axial images were obtained from the base of the skull through the vertex without intravenous contrast. COMPARISON:  None. FINDINGS: Brain: No subdural, epidural, or subarachnoid hemorrhage identified. There is encephalomalacia in the medial inferior left frontal lobe on series 2, image 79 and sagittal image 29 consistent with a previous infarct. No other infarcts are identified. No acute ischemia is noted. No mass effect or midline shift. Ventricles and sulci are normal. Cerebellum, brainstem, and basal cisterns are normal. Vascular: No hyperdense vessel or unexpected calcification. Skull: Normal. Negative for fracture or focal lesion. Sinuses/Orbits: No acute finding. Other: None. IMPRESSION: 1. No acute intracranial abnormalities identified. 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 Electronically Signed   By: Dorise Bullion III M.D   On: 02/16/2019 17:11   Ct Chest W Contrast  Result Date: 02/16/2019 CLINICAL DATA:  Confusion. Muscle weakness. Altered mental status. Pallor. Nausea and vomiting. EXAM: CT CHEST, ABDOMEN, AND PELVIS WITH CONTRAST TECHNIQUE: Multidetector CT imaging of the chest, abdomen and pelvis was performed following the standard protocol during bolus administration of intravenous contrast. CONTRAST:  168mL OMNIPAQUE IOHEXOL 300 MG/ML  SOLN  COMPARISON:  Multiple exams, including CT pelvis 05/31/2018; CT chest from 04/26/2017; CT abdomen from 08/23/2015 FINDINGS: CT CHEST FINDINGS Cardiovascular: Unremarkable Mediastinum/Nodes: AP window lymph node, 1.0 cm in short axis on image 22/2, formerly the same by my measurements. Right hilar lymph node 0.9 cm in short axis on image 26/2, stable. Left para-aortic lymph node 0.5 cm in short axis on image 42/2, stable. Lungs/Pleura: Paraseptal emphysema. Bilateral airway thickening. Small calcified granuloma in the right upper lobe on image 30/4. Musculoskeletal: Unremarkable CT ABDOMEN PELVIS FINDINGS Hepatobiliary: Old granulomatous disease. Gallbladder unremarkable. Mild focal steatosis along the falciform ligament. Pancreas: Unremarkable Spleen: Unremarkable Adrenals/Urinary Tract: 2 mm right kidney lower pole nonobstructive renal calculus on image 54/5. Adrenal glands normal. The kidneys appear otherwise normal. Left-sided urinary bladder at left distal ureter obscured by streak artifact from the patient's left hip implant. Stomach/Bowel: Wall thickening in the ascending colon. Normal appendix. There are few scattered air-fluid levels in nondilated small bowel loops. Vascular/Lymphatic: Mild iliac artery atherosclerotic calcification. No pathologic adenopathy in the abdomen. Reproductive: Unremarkable. Other: No supplemental non-categorized findings. Musculoskeletal: Hernia mesh along the lower pelvic anterior abdominal wall. Left total hip prosthesis. Transitional lumbosacral vertebra. IMPRESSION: 1. Wall thickening in the ascending colon suspicious for colitis. Normal appendix. 2. There are a few scattered air-fluid levels in nondilated small bowel, strictly speaking, antritis is not excluded. No gas in the bowel wall or extraluminal gas. 3.  Emphysema (ICD10-J43.9). 4. Stable upper normal sized right hilar and AP window lymph nodes. 5. Airway thickening is present, suggesting bronchitis or reactive airways  disease. 6. Nonobstructive right nephrolithiasis. Electronically Signed   By: Van Clines M.D.   On: 02/16/2019 17:21   Ct Abdomen Pelvis W Contrast  Result Date: 02/16/2019 CLINICAL DATA:  Confusion. Muscle weakness. Altered mental status. Pallor. Nausea and vomiting. EXAM: CT CHEST, ABDOMEN, AND PELVIS WITH CONTRAST TECHNIQUE: Multidetector CT imaging of the chest, abdomen and pelvis was performed following the standard protocol during bolus administration of intravenous contrast. CONTRAST:  176mL OMNIPAQUE IOHEXOL 300 MG/ML  SOLN COMPARISON:  Multiple exams,  including CT pelvis 05/31/2018; CT chest from 04/26/2017; CT abdomen from 08/23/2015 FINDINGS: CT CHEST FINDINGS Cardiovascular: Unremarkable Mediastinum/Nodes: AP window lymph node, 1.0 cm in short axis on image 22/2, formerly the same by my measurements. Right hilar lymph node 0.9 cm in short axis on image 26/2, stable. Left para-aortic lymph node 0.5 cm in short axis on image 42/2, stable. Lungs/Pleura: Paraseptal emphysema. Bilateral airway thickening. Small calcified granuloma in the right upper lobe on image 30/4. Musculoskeletal: Unremarkable CT ABDOMEN PELVIS FINDINGS Hepatobiliary: Old granulomatous disease. Gallbladder unremarkable. Mild focal steatosis along the falciform ligament. Pancreas: Unremarkable Spleen: Unremarkable Adrenals/Urinary Tract: 2 mm right kidney lower pole nonobstructive renal calculus on image 54/5. Adrenal glands normal. The kidneys appear otherwise normal. Left-sided urinary bladder at left distal ureter obscured by streak artifact from the patient's left hip implant. Stomach/Bowel: Wall thickening in the ascending colon. Normal appendix. There are few scattered air-fluid levels in nondilated small bowel loops. Vascular/Lymphatic: Mild iliac artery atherosclerotic calcification. No pathologic adenopathy in the abdomen. Reproductive: Unremarkable. Other: No supplemental non-categorized findings. Musculoskeletal:  Hernia mesh along the lower pelvic anterior abdominal wall. Left total hip prosthesis. Transitional lumbosacral vertebra. IMPRESSION: 1. Wall thickening in the ascending colon suspicious for colitis. Normal appendix. 2. There are a few scattered air-fluid levels in nondilated small bowel, strictly speaking, antritis is not excluded. No gas in the bowel wall or extraluminal gas. 3.  Emphysema (ICD10-J43.9). 4. Stable upper normal sized right hilar and AP window lymph nodes. 5. Airway thickening is present, suggesting bronchitis or reactive airways disease. 6. Nonobstructive right nephrolithiasis. Electronically Signed   By: Van Clines M.D.   On: 02/16/2019 17:21    Scheduled Meds: . aspirin EC  81 mg Oral Daily  . atorvastatin  20 mg Oral Daily  . DULoxetine  60 mg Oral Daily  . feeding supplement (ENSURE ENLIVE)  237 mL Oral TID BM  . folic acid  1 mg Oral Daily  . heparin  5,000 Units Subcutaneous Q8H  . multivitamin with minerals  1 tablet Oral Daily  . [START ON 02/19/2019] potassium chloride  40 mEq Oral Daily  . pregabalin  100 mg Oral TID   Continuous Infusions: . sodium chloride Stopped (02/17/19 2317)  . ciprofloxacin Stopped (02/18/19 0826)  . famotidine (PEPCID) IV Stopped (02/17/19 1925)  . metronidazole Stopped (02/18/19 1442)  . potassium PHOSPHATE IVPB (in mmol) 85 mL/hr at 02/18/19 1600    Assessment/Plan:  1. Severe hypokalemia, hypophosphatemia and hypomagnesemia.  Potassium replaced into the normal range.  Replace phosphorus with K-Phos today.  Check for other vitamin deficiencies 2. Relative hypotension discontinue Zanaflex. 3. Generalized weakness and weight loss.  CT scan does not show any signs of malignancy.  Patient did have an endoscopy and colonoscopy that did not show any signs of malignancy.  Add on a PSA.  Could be some sort of metal absorptive disorder. 4. Colitis on CT scan.  Empiric antibiotics with Cipro and Flagyl.  Stool studies ordered since  patient had diarrhea today.  Did not have diarrhea on since admission. 5. Hyperlipidemia unspecified hold atorvastatin 6. Depression on Cymbalta 7. Neuropathy on Lyrica 8. Weakness physical therapy evaluation 9. Anemia of chronic disease  Code Status:     Code Status Orders  (From admission, onward)         Start     Ordered   02/16/19 1848  Full code  Continuous     02/16/19 1847        Code Status  History    Date Active Date Inactive Code Status Order ID Comments User Context   09/21/2016 1512 09/23/2016 2135 Full Code [id number]  Epifanio Lesches, MD ED   06/15/2016 2015 06/19/2016 2007 Full Code [id number]  Demetrios Loll, MD Inpatient   Advance Care Planning Activity      Disposition Plan: To be determined  Consultants:  Oncology  Infectious disease  Antibiotics:  Cipro and Flagyl    Time spent: 71 minutes    [place]

## 2019-02-18 NOTE — Progress Notes (Signed)
Pharmacy Electrolyte Monitoring Consult:  Pharmacy consulted to assist in monitoring and replacing electrolytes in this 53 y.o. male admitted on 02/16/2019 with Weakness and Altered Mental Status malnutrition  Labs:  Sodium (mmol/L)  Date Value  02/17/2019 131 (L)  07/02/2018 142  12/25/2013 138   Potassium (mmol/L)  Date Value  02/17/2019 3.0 (L)  12/25/2013 2.9 (L)   Magnesium (mg/dL)  Date Value  02/17/2019 1.6 (L)   Phosphorus (mg/dL)  Date Value  02/17/2019 1.7 (L)   Calcium (mg/dL)  Date Value  02/17/2019 7.3 (L)   Calcium, Total (mg/dL)  Date Value  12/25/2013 8.8   Albumin (g/dL)  Date Value  02/16/2019 2.5 (L)  07/02/2018 3.7   Assessment/Plan: K 2.2  Scr 0.86  (Mag 1.8 on 8/30)  Na 131 Phos 1.7  Mag 1.6 Patient currently ordered NS with KCL 40 meq at 50 ml/hr and KCL 40 meq PO TID.   MD also ordered Magnesium sulfate 2gm IV x1 and now Potassium Phosphate 30 mmol IV x 1 Recheck K at 2200  >> K 3.0 at 2201  >> KCL 43mEq IV q1h x 3 doses (63mEq total)  F/u electrolytes with am labs   Hart Robinsons A 02/18/2019 12:58 AM

## 2019-02-18 NOTE — Plan of Care (Signed)
Patient doing well today.  Potassium has improved.  Eating better and tolerating it well.  PT worked with the patient today and he said he did ok but did get lightheaded.  C Diff was negative.  No significant changes.

## 2019-02-18 NOTE — Progress Notes (Signed)
Pharmacy Electrolyte Monitoring Consult:  Pharmacy consulted to assist in monitoring and replacing electrolytes in this 53 y.o. male admitted on 02/16/2019 with Weakness and Altered Mental Status malnutrition  Labs:  Sodium (mmol/L)  Date Value  02/18/2019 135  07/02/2018 142  12/25/2013 138   Potassium (mmol/L)  Date Value  02/18/2019 4.1  12/25/2013 2.9 (L)   Magnesium (mg/dL)  Date Value  02/18/2019 1.9   Phosphorus (mg/dL)  Date Value  02/18/2019 1.9 (L)   Calcium (mg/dL)  Date Value  02/18/2019 7.0 (L)   Calcium, Total (mg/dL)  Date Value  12/25/2013 8.8   Albumin (g/dL)  Date Value  02/16/2019 2.5 (L)  07/02/2018 3.7   Assessment/Plan: 8/31 K 2.2 supplemented with KCl PO 40 mEq TID, as well as IV 10 mEq q1h x 3 doses and NS with KCL 40 meq at 50 ml/hr.  9/1 K 4.1.  Goal wnl.  Will f/u with K level in the morning as NS with KCL 40 mEQ stopped after level and Kphos given ordered today.  Phos 1.9 today.  Goal wnl.  Kphos 30 mmol x 1 dose ordered.  Mag 1.9, and wnl.  No supplementation needed at this time.  Corrected calcium 8.2.  No supplementation needed at this time.  F/u electrolytes with am labs  Gerald Dexter, PharmD Pharmacy Resident  02/18/2019 12:09 PM

## 2019-02-18 NOTE — Evaluation (Signed)
Physical Therapy Evaluation Patient Details Name: Kevin Alexander MRN: [id number] DOB: 04/08/1966 Today's Date: 02/18/2019   History of Present Illness  Kevin Alexander is a 39yoM with PMH of anemia, aortic valve disorders, arthritis, asthma, COPD, depression, gastroesophageal reflux disease, myocardial infarction, sleep apnea, peptic ulcer disease with multiple esophageal dilations. He has lost 40 pound weight in last 1 year and have generalized weakness.  He is weak to the point that now he needs support to get out of the bed and walk around also. As per him cardiologist, neurologist and his primary care doctor has done multiple work-up but they did not find any clear reason. His potassium was noted less than 2 upon arrival.  Clinical Impression  Pt admitted with above diagnosis. Pt currently with functional limitations due to the deficits listed below (see "PT Problem List"). Upon entry, pt in bed, awake and agreeable to participate. The pt is alert and oriented x4, pleasant, conversational, and generally a good historian, but I suspect his education level may limit his understand of or ability to communicate certain details of his medical history. Also, each attempt to cue pt to qualify his pain resulted in pt explaining that he was "just weak all over." Additional effort and time needed for all mobility, RW improves confidence and safety, and AMB tolerated up to 141ft. Pt has poor tolerance to upright positions with progressive dizziness, BP hypotensive but flat when assessed supine-standing. Functional mobility assessment demonstrates increased effort/time requirements, poor tolerance, and need for physical assistance, whereas the patient performed these at a similar level of independence and impairment PTA, although his baseline is much higher. Pt should be safe for rehab at home, but will need to focus on stairs performance here as he has 10 to enter his home at [place]. Pt will benefit from  skilled PT intervention to increase independence and safety with basic mobility in preparation for discharge to the venue listed below.       Follow Up Recommendations Home health PT;Supervision - Intermittent;Supervision for mobility/OOB    Equipment Recommendations  None recommended by PT    Recommendations for Other Services       Precautions / Restrictions Precautions Precautions: Fall Precaution Comments: lytes, BP Restrictions Weight Bearing Restrictions: No      Mobility  Bed Mobility Overal bed mobility: Modified Independent             General bed mobility comments: additional time, effort needed  Transfers Overall transfer level: Modified independent Equipment used: Rolling walker (2 wheeled)             General transfer comment: additional time, effort needed; uses RW to trial for utility in improving confidence with stability.  Ambulation/Gait Ambulation/Gait assistance: Modified independent (Device/Increase time) Gait Distance (Feet): 100 Feet Assistive device: Rolling walker (2 wheeled) Gait Pattern/deviations: Step-to pattern     General Gait Details: generally slow, weak appearing, requires rest after 116ft, then seated tolerates less than 2 minutes prior to need to return to supine d/t  worsening dizziness.  Stairs            Wheelchair Mobility    Modified Rankin (Stroke Patients Only)       Balance Overall balance assessment: Modified Independent;History of Falls                                           Pertinent  Vitals/Pain Pain Assessment: Faces Faces Pain Scale: Hurts even more Pain Location: reports to hurt all over from head to toe, but each time asked for clarification he describes. Pain Descriptors / Indicators: Aching Pain Intervention(s): Limited activity within patient's tolerance;Monitored during session    Home Living Family/patient expects to be discharged to:: Private residence Living  Arrangements: Other relatives(male cousin, has a broken arm) Available Help at Discharge: Family Type of Home: House Home Access: Stairs to enter Entrance Stairs-Rails: Can reach both Entrance Stairs-Number of Steps: 10 (other easier way is in disrepair) Home Layout: One level Home Equipment: Crutches;Cane - single point;Walker - 2 wheels;Bedside commode;Other (comment) Additional Comments: adjustable bed, but has been sleeping in a recliner for couple weeks.    Prior Function Level of Independence: Needs assistance   Gait / Transfers Assistance Needed: household AMB only, easily worn out  ADL's / Homemaking Assistance Needed: indepenednt with ADL but takes breaks and additional time.        Hand Dominance   Dominant Hand: Right    Extremity/Trunk Assessment   Upper Extremity Assessment Upper Extremity Assessment: Overall WFL for tasks assessed;Generalized weakness    Lower Extremity Assessment Lower Extremity Assessment: Overall WFL for tasks assessed;Generalized weakness       Communication      Cognition Arousal/Alertness: Lethargic Behavior During Therapy: WFL for tasks assessed/performed;Flat affect Overall Cognitive Status: Within Functional Limits for tasks assessed                                 General Comments: slow speech generally      General Comments      Exercises     Assessment/Plan    PT Assessment Patient needs continued PT services  PT Problem List Decreased strength;Decreased range of motion;Decreased activity tolerance;Decreased balance;Decreased mobility;Decreased knowledge of use of DME       PT Treatment Interventions Balance training;DME instruction;Gait training;Stair training;Functional mobility training;Therapeutic activities;Therapeutic exercise;Patient/family education    PT Goals (Current goals can be found in the Care Plan section)  Acute Rehab PT Goals Patient Stated Goal: return to home, regain  strength PT Goal Formulation: With patient Time For Goal Achievement: 03/04/19 Potential to Achieve Goals: Fair    Frequency Min 2X/week   Barriers to discharge Inaccessible home environment 10 steps to enter home    Co-evaluation               AM-PAC PT "6 Clicks" Mobility  Outcome Measure Help needed turning from your back to your side while in a flat bed without using bedrails?: None Help needed moving from lying on your back to sitting on the side of a flat bed without using bedrails?: None Help needed moving to and from a bed to a chair (including a wheelchair)?: None Help needed standing up from a chair using your arms (e.g., wheelchair or bedside chair)?: None Help needed to walk in hospital room?: A Little Help needed climbing 3-5 steps with a railing? : A Little 6 Click Score: 22    End of Session Equipment Utilized During Treatment: Gait belt Activity Tolerance: Patient limited by fatigue;Patient tolerated treatment well;No increased pain;Treatment limited secondary to medical complications (Comment)(progressive dizziness adn hypotension while upright) Patient left: in bed;with call bell/phone within reach;with bed alarm set Nurse Communication: Mobility status PT Visit Diagnosis: Other abnormalities of gait and mobility (R26.89);Repeated falls (R29.6);Muscle weakness (generalized) (M62.81);Difficulty in walking, not elsewhere classified (R26.2);Other symptoms and  signs involving the nervous system (R29.898);Dizziness and giddiness (R42)    Time: [id number] PT Time Calculation (min) (ACUTE ONLY): 26 min   Charges:   PT Evaluation $PT Eval High Complexity: 1 High          2:34 PM, 02/18/19 Etta Grandchild, PT, DPT Physical Therapist - [facility]  [phone] ([place])    [place] C 02/18/2019, 2:30 PM

## 2019-02-18 NOTE — Progress Notes (Signed)
Date of Admission:  02/16/2019     Patient says he is feeling little better. No nausea or vomiting. No diarrhea No fever Still has some abdominal pain. Medications:  . aspirin EC  81 mg Oral Daily  . atorvastatin  20 mg Oral Daily  . DULoxetine  60 mg Oral Daily  . feeding supplement (ENSURE ENLIVE)  237 mL Oral TID BM  . folic acid  1 mg Oral Daily  . heparin  5,000 Units Subcutaneous Q8H  . multivitamin with minerals  1 tablet Oral Daily  . [START ON 02/19/2019] potassium chloride  40 mEq Oral Daily  . pregabalin  100 mg Oral TID    Objective: Vital signs in last 24 hours: Temp:  [97.5 F (36.4 C)-98 F (36.7 C)] 97.5 F (36.4 C) (09/01 1454) Pulse Rate:  [77-87] 84 (09/01 1454) Resp:  [16-20] 16 (09/01 1454) BP: (97-104)/(67-73) 97/73 (09/01 1454) SpO2:  [99 %] 99 % (09/01 1454)  PHYSICAL EXAM:  General: Alert, cooperative, no distress, appears stated age.  Pale Head: Normocephalic, without obvious abnormality, atraumatic. Eyes: Conjunctivae clear, anicteric sclerae. Pupils are equal, eyelids puffy ENT Nares normal. No drainage or sinus tenderness. Lips, mucosa, and tongue normal. No Thrush, edentulous Neck: Supple, symmetrical, no adenopathy, thyroid: non tender no carotid bruit and no JVD. Back: No CVA tenderness. Lungs: Clear to auscultation bilaterally. No Wheezing or Rhonchi. No rales. Heart: Regular rate and rhythm, no murmur, rub or gallop. Abdomen: Soft, tenderness on the left side.  Bowel sounds normal. No masses Extremities: atraumatic, no cyanosis. No edema. No clubbing Skin: No rashes or lesions. Or bruising Lymph: Cervical, supraclavicular normal. Neurologic: Grossly non-focal  Lab Results Recent Labs    02/16/19 1516 02/17/19 0610 02/17/19 2201 02/18/19 0649  WBC 19.0* 8.4  --   --   HGB 13.0 9.3*  --   --   HCT 37.5* 27.0*  --   --   NA 127* 131*  --  135  K <2.0* 2.2* 3.0* 4.1  CL 81* 94*  --  104  CO2 30 29  --  24  BUN 8 7  --  <5*   CREATININE 0.97 0.86  --  0.74   Liver Panel Recent Labs    02/16/19 1516  PROT 7.2  ALBUMIN 2.5*  AST 13*  ALT 6  ALKPHOS 125  BILITOT 1.0   Sedimentation Rate Recent Labs    02/18/19 0649  ESRSEDRATE 65*   C-Reactive Protein Recent Labs    02/18/19 0649  CRP 4.9*   Cortisol, TSH normal Folate low Microbiology:  Studies/Results: Ct Head Wo Contrast  Result Date: 02/16/2019 CLINICAL DATA:  Confusion.  Weight loss. EXAM: CT HEAD WITHOUT CONTRAST TECHNIQUE: Contiguous axial images were obtained from the base of the skull through the vertex without intravenous contrast. COMPARISON:  None. FINDINGS: Brain: No subdural, epidural, or subarachnoid hemorrhage identified. There is encephalomalacia in the medial inferior left frontal lobe on series 2, image 79 and sagittal image 29 consistent with a previous infarct. No other infarcts are identified. No acute ischemia is noted. No mass effect or midline shift. Ventricles and sulci are normal. Cerebellum, brainstem, and basal cisterns are normal. Vascular: No hyperdense vessel or unexpected calcification. Skull: Normal. Negative for fracture or focal lesion. Sinuses/Orbits: No acute finding. Other: None. IMPRESSION: 1. No acute intracranial abnormalities identified. 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 Electronically Signed   By:  Dorise Bullion III M.D   On: 02/16/2019 17:11   Ct Chest W Contrast  Result Date: 02/16/2019 CLINICAL DATA:  Confusion. Muscle weakness. Altered mental status. Pallor. Nausea and vomiting. EXAM: CT CHEST, ABDOMEN, AND PELVIS WITH CONTRAST TECHNIQUE: Multidetector CT imaging of the chest, abdomen and pelvis was performed following the standard protocol during bolus administration of intravenous contrast. CONTRAST:  124mL OMNIPAQUE IOHEXOL 300 MG/ML  SOLN COMPARISON:  Multiple exams, including CT pelvis 05/31/2018; CT chest from  04/26/2017; CT abdomen from 08/23/2015 FINDINGS: CT CHEST FINDINGS Cardiovascular: Unremarkable Mediastinum/Nodes: AP window lymph node, 1.0 cm in short axis on image 22/2, formerly the same by my measurements. Right hilar lymph node 0.9 cm in short axis on image 26/2, stable. Left para-aortic lymph node 0.5 cm in short axis on image 42/2, stable. Lungs/Pleura: Paraseptal emphysema. Bilateral airway thickening. Small calcified granuloma in the right upper lobe on image 30/4. Musculoskeletal: Unremarkable CT ABDOMEN PELVIS FINDINGS Hepatobiliary: Old granulomatous disease. Gallbladder unremarkable. Mild focal steatosis along the falciform ligament. Pancreas: Unremarkable Spleen: Unremarkable Adrenals/Urinary Tract: 2 mm right kidney lower pole nonobstructive renal calculus on image 54/5. Adrenal glands normal. The kidneys appear otherwise normal. Left-sided urinary bladder at left distal ureter obscured by streak artifact from the patient's left hip implant. Stomach/Bowel: Wall thickening in the ascending colon. Normal appendix. There are few scattered air-fluid levels in nondilated small bowel loops. Vascular/Lymphatic: Mild iliac artery atherosclerotic calcification. No pathologic adenopathy in the abdomen. Reproductive: Unremarkable. Other: No supplemental non-categorized findings. Musculoskeletal: Hernia mesh along the lower pelvic anterior abdominal wall. Left total hip prosthesis. Transitional lumbosacral vertebra. IMPRESSION: 1. Wall thickening in the ascending colon suspicious for colitis. Normal appendix. 2. There are a few scattered air-fluid levels in nondilated small bowel, strictly speaking, antritis is not excluded. No gas in the bowel wall or extraluminal gas. 3.  Emphysema (ICD10-J43.9). 4. Stable upper normal sized right hilar and AP window lymph nodes. 5. Airway thickening is present, suggesting bronchitis or reactive airways disease. 6. Nonobstructive right nephrolithiasis. Electronically Signed    By: Van Clines M.D.   On: 02/16/2019 17:21   Ct Abdomen Pelvis W Contrast  Result Date: 02/16/2019 CLINICAL DATA:  Confusion. Muscle weakness. Altered mental status. Pallor. Nausea and vomiting. EXAM: CT CHEST, ABDOMEN, AND PELVIS WITH CONTRAST TECHNIQUE: Multidetector CT imaging of the chest, abdomen and pelvis was performed following the standard protocol during bolus administration of intravenous contrast. CONTRAST:  115mL OMNIPAQUE IOHEXOL 300 MG/ML  SOLN COMPARISON:  Multiple exams, including CT pelvis 05/31/2018; CT chest from 04/26/2017; CT abdomen from 08/23/2015 FINDINGS: CT CHEST FINDINGS Cardiovascular: Unremarkable Mediastinum/Nodes: AP window lymph node, 1.0 cm in short axis on image 22/2, formerly the same by my measurements. Right hilar lymph node 0.9 cm in short axis on image 26/2, stable. Left para-aortic lymph node 0.5 cm in short axis on image 42/2, stable. Lungs/Pleura: Paraseptal emphysema. Bilateral airway thickening. Small calcified granuloma in the right upper lobe on image 30/4. Musculoskeletal: Unremarkable CT ABDOMEN PELVIS FINDINGS Hepatobiliary: Old granulomatous disease. Gallbladder unremarkable. Mild focal steatosis along the falciform ligament. Pancreas: Unremarkable Spleen: Unremarkable Adrenals/Urinary Tract: 2 mm right kidney lower pole nonobstructive renal calculus on image 54/5. Adrenal glands normal. The kidneys appear otherwise normal. Left-sided urinary bladder at left distal ureter obscured by streak artifact from the patient's left hip implant. Stomach/Bowel: Wall thickening in the ascending colon. Normal appendix. There are few scattered air-fluid levels in nondilated small bowel loops. Vascular/Lymphatic: Mild iliac artery atherosclerotic calcification. No pathologic adenopathy in  the abdomen. Reproductive: Unremarkable. Other: No supplemental non-categorized findings. Musculoskeletal: Hernia mesh along the lower pelvic anterior abdominal wall. Left total hip  prosthesis. Transitional lumbosacral vertebra. IMPRESSION: 1. Wall thickening in the ascending colon suspicious for colitis. Normal appendix. 2. There are a few scattered air-fluid levels in nondilated small bowel, strictly speaking, antritis is not excluded. No gas in the bowel wall or extraluminal gas. 3.  Emphysema (ICD10-J43.9). 4. Stable upper normal sized right hilar and AP window lymph nodes. 5. Airway thickening is present, suggesting bronchitis or reactive airways disease. 6. Nonobstructive right nephrolithiasis. Electronically Signed   By: Van Clines M.D.   On: 02/16/2019 17:21     Assessment/Plan: 53 year old male presenting with chronic symptoms of weight loss, GI symptoms of diarrhea nausea and vomiting, anemia sensory peripheral neuropathy and hypoalbuminemia.  His hemoglobin is fluctuated with being anemic and improving on iron and now it is low again he had hyponatremia and hypokalemia on presentation. This constellation of symptoms and signs can be seen malabsorption, Whipple's disease, amyloidosis.  Having a bicuspid aortic valve need to rule out endocarditis even though less likely.  Tuberculosis is less likely and will not be present for nearly 4 years without being evident or worse.  But QuantiFERON gold has been sent.  He has folate deficiency which indicates malabsorption again.  He will need intestinal biopsy sent for PAS staining, immunohistochemistry and PCR for Whipple's disease.  [place] red for amyloidosis.  Celiac disease will also have to be ruled out.  Patient is currently on ciprofloxacin and Flagyl.  He may have bacterial overgrowth.  2 can be given for 5 days.  Today is day 3 out of 5.   Discussed the management with the patient.

## 2019-02-19 DIAGNOSIS — R10819 Abdominal tenderness, unspecified site: Secondary | ICD-10-CM

## 2019-02-19 LAB — MULTIPLE MYELOMA PANEL, SERUM
Albumin SerPl Elph-Mcnc: 1.8 g/dL — ABNORMAL LOW (ref 2.9–4.4)
Albumin/Glob SerPl: 0.7 (ref 0.7–1.7)
Alpha 1: 0.4 g/dL (ref 0.0–0.4)
Alpha2 Glob SerPl Elph-Mcnc: 0.9 g/dL (ref 0.4–1.0)
B-Globulin SerPl Elph-Mcnc: 0.7 g/dL (ref 0.7–1.3)
Gamma Glob SerPl Elph-Mcnc: 0.7 g/dL (ref 0.4–1.8)
Globulin, Total: 2.7 g/dL (ref 2.2–3.9)
IgA: 320 mg/dL (ref 90–386)
IgG (Immunoglobin G), Serum: 729 mg/dL (ref 603–1613)
IgM (Immunoglobulin M), Srm: 59 mg/dL (ref 20–172)
Total Protein ELP: 4.5 g/dL — ABNORMAL LOW (ref 6.0–8.5)

## 2019-02-19 LAB — VITAMIN D 25 HYDROXY (VIT D DEFICIENCY, FRACTURES): Vit D, 25-Hydroxy: 12.2 ng/mL — ABNORMAL LOW (ref 30.0–100.0)

## 2019-02-19 LAB — BASIC METABOLIC PANEL
Anion gap: 8 (ref 5–15)
BUN: <5 mg/dL — ABNORMAL LOW (ref 6–20)
CO2: 26 mmol/L (ref 22–32)
Calcium: 7.3 mg/dL — ABNORMAL LOW (ref 8.9–10.3)
Chloride: 103 mmol/L (ref 98–111)
Creatinine, Ser: 0.8 mg/dL (ref 0.61–1.24)
GFR calc Af Amer: >60 mL/min (ref >60)
GFR calc non Af Amer: >60 mL/min (ref >60)
Glucose, Bld: 87 mg/dL (ref 70–99)
Potassium: 5 mmol/L (ref 3.5–5.1)
Sodium: 137 mmol/L (ref 135–145)

## 2019-02-19 LAB — KAPPA/LAMBDA LIGHT CHAINS
Kappa free light chain: 31.6 mg/L — ABNORMAL HIGH (ref 3.3–19.4)
Kappa, lambda light chain ratio: 0.98 (ref 0.26–1.65)
Lambda free light chains: 32.2 mg/L — ABNORMAL HIGH (ref 5.7–26.3)

## 2019-02-19 LAB — HEPATITIS B SURFACE ANTIGEN: Hepatitis B Surface Ag: NEGATIVE

## 2019-02-19 LAB — HIV ANTIBODY (ROUTINE TESTING W REFLEX): HIV Screen 4th Generation wRfx: NONREACTIVE

## 2019-02-19 LAB — PHOSPHORUS: Phosphorus: 4.2 mg/dL (ref 2.5–4.6)

## 2019-02-19 LAB — HEPATITIS C ANTIBODY: HCV Ab: 0.1 s/co ratio (ref 0.0–0.9)

## 2019-02-19 LAB — ANA W/REFLEX: Anti Nuclear Antibody (ANA): NEGATIVE

## 2019-02-19 MED ORDER — VITAMIN D 25 MCG (1000 UNIT) PO TABS
1000.0000 [IU] | ORAL_TABLET | Freq: Every day | ORAL | Status: DC
  Administered 2019-02-19 – 2019-02-20 (×2): 1000 [IU] via ORAL
  Filled 2019-02-19 (×2): qty 1

## 2019-02-19 MED ORDER — VITAMIN D (ERGOCALCIFEROL) 1.25 MG (50000 UNIT) PO CAPS
50000.0000 [IU] | ORAL_CAPSULE | ORAL | Status: DC
  Filled 2019-02-19: qty 1

## 2019-02-19 MED ORDER — BOOST / RESOURCE BREEZE PO LIQD CUSTOM
1.0000 | Freq: Three times a day (TID) | ORAL | Status: DC
  Administered 2019-02-19 – 2019-02-20 (×4): 1 via ORAL

## 2019-02-19 NOTE — Care Management Important Message (Signed)
Important Message  Patient Details  Name: Kevin Alexander MRN: [id number] Date of Birth: March 01, 1966   Medicare Important Message Given:  Yes     Dannette Barbara 02/19/2019, 10:37 AM

## 2019-02-19 NOTE — Progress Notes (Signed)
Patient ID: Kevin Alexander, male   DOB: 16-Nov-1965, 53 y.o.   MRN: [id number]  [facility] PROGRESS NOTE  Kevin Alexander [id number] DOB: [birth year] DOA: 02/16/2019 PCP: Sallee Lange, NP  HPI/Subjective: Patient having less abdominal pain.  Had some diarrhea today.  No nausea or vomiting.  Able to eat some food.  Feels weak.  Feels a little bit better today than yesterday.  Objective: Vitals:   02/19/19 0454 02/19/19 1139  BP: 101/75 100/68  Pulse: 79 86  Resp: 16 16  Temp: 98.5 F (36.9 C) (!) 97.5 F (36.4 C)  SpO2: 100% 100%    Intake/Output Summary (Last 24 hours) at 02/19/2019 1427 Last data filed at 02/19/2019 0900 Gross per 24 hour  Intake 1453.66 ml  Output 3250 ml  Net -1796.34 ml   Filed Weights   02/16/19 1517  Weight: 45.4 kg    ROS: Review of Systems  Constitutional: Negative for chills and fever.  Eyes: Negative for blurred vision.  Respiratory: Negative for cough and shortness of breath.   Cardiovascular: Negative for chest pain.  Gastrointestinal: Positive for abdominal pain and diarrhea. Negative for constipation, nausea and vomiting.  Genitourinary: Negative for dysuria.  Musculoskeletal: Negative for joint pain.  Neurological: Negative for dizziness and headaches.   Exam: Physical Exam  Constitutional: He is oriented to person, place, and time.  HENT:  Nose: No mucosal edema.  Mouth/Throat: No oropharyngeal exudate or posterior oropharyngeal edema.  Eyes: Pupils are equal, round, and reactive to light. Conjunctivae, EOM and lids are normal.  Neck: No JVD present. Carotid bruit is not present. No edema present. No thyroid mass and no thyromegaly present.  Cardiovascular: S1 normal and S2 normal. Exam reveals no gallop.  No murmur heard. Pulses:      Dorsalis pedis pulses are 2+ on the right side and 2+ on the left side.  Respiratory: No respiratory distress. He has no wheezes. He has no rhonchi. He has no rales.  GI: Soft. Bowel  sounds are normal. There is abdominal tenderness in the epigastric area.  Musculoskeletal:     Right ankle: He exhibits no swelling.     Left ankle: He exhibits no swelling.  Lymphadenopathy:    He has no cervical adenopathy.  Neurological: He is alert and oriented to person, place, and time. No cranial nerve deficit.  Skin: Skin is warm. No rash noted. Nails show no clubbing.  Psychiatric: He has a normal mood and affect.      Data Reviewed: Basic Metabolic Panel: Recent Labs  Lab 02/16/19 1516 02/16/19 1604 02/17/19 0610 02/17/19 0615 02/17/19 2201 02/18/19 0649 02/19/19 0425  NA 127*  --  131*  --   --  135 137  K <2.0*  --  2.2*  --  3.0* 4.1 5.0  CL 81*  --  94*  --   --  104 103  CO2 30  --  29  --   --  24 26  GLUCOSE 120*  --  93  --   --  88 87  BUN 8  --  7  --   --  <5* <5*  CREATININE 0.97  --  0.86  --   --  0.74 0.80  CALCIUM 8.3*  --  7.3*  --   --  7.0* 7.3*  MG  --  1.8  --  1.6*  --  1.9  --   PHOS  --   --   --  1.7*  --  1.9* 4.2   Liver Function Tests: Recent Labs  Lab 02/16/19 1516  AST 13*  ALT 6  ALKPHOS 125  BILITOT 1.0  PROT 7.2  ALBUMIN 2.5*   Recent Labs  Lab 02/18/19 0649  LIPASE 47  AMYLASE 49     CBC: Recent Labs  Lab 02/16/19 1516 02/17/19 0610  WBC 19.0* 8.4  NEUTROABS 15.9*  --   HGB 13.0 9.3*  HCT 37.5* 27.0*  MCV 86.8 90.0  PLT 338 190     Recent Results (from the past 240 hour(s))  Culture, blood (Routine x 2)     Status: None (Preliminary result)   Collection Time: 02/16/19  3:20 PM   Specimen: BLOOD  Result Value Ref Range Status   Specimen Description BLOOD BLOOD LEFT FOREARM  Final   Special Requests   Final    BOTTLES DRAWN AEROBIC AND ANAEROBIC Blood Culture results may not be optimal due to an excessive volume of blood received in culture bottles   Culture   Final    NO GROWTH 3 DAYS Performed at [facility], [address], [place], [place] [address]    Report Status PENDING   Incomplete  Culture, blood (Routine x 2)     Status: None (Preliminary result)   Collection Time: 02/16/19  3:52 PM   Specimen: BLOOD  Result Value Ref Range Status   Specimen Description BLOOD R AC  Final   Special Requests   Final    BOTTLES DRAWN AEROBIC AND ANAEROBIC Blood Culture results may not be optimal due to an excessive volume of blood received in culture bottles   Culture   Final    NO GROWTH 3 DAYS Performed at [facility], [address] [place], [place] [address]    Report Status PENDING  Incomplete  Novel Coronavirus, NAA (Hosp order, Send-out to Ref Lab; TAT 18-24 hrs     Status: None   Collection Time: 02/16/19  5:41 PM   Specimen: Nasopharyngeal Swab; Respiratory  Result Value Ref Range Status   SARS-CoV-2, NAA NOT DETECTED NOT DETECTED Final    Comment: (NOTE) This test was developed and its performance characteristics determined by [employer]. This test has not been FDA cleared or approved. This test has been authorized by FDA under an Emergency Use Authorization (EUA). This test is only authorized for the duration of time the declaration that circumstances exist justifying the authorization of the emergency use of in vitro diagnostic tests for detection of SARS-CoV-2 virus and/or diagnosis of COVID-19 infection under section 564(b)(1) of the Act, 21 U.S.C. KA:123727), unless the authorization is terminated or revoked sooner. When diagnostic testing is negative, the possibility of a false negative result should be considered in the context of a patient's recent exposures and the presence of clinical signs and symptoms consistent with COVID-19. An individual without symptoms of COVID-19 and who is not shedding SARS-CoV-2 virus would expect to have a negative (not detected) result in this assay. Performed  At: [facility] [address] [place], [place] [id number] Rush Farmer MD [id number]    [place]  Final    Comment: Performed at [facility], [address] [place], [place] [address]  Gastrointestinal Panel by PCR , Stool     Status: None   Collection Time: 02/18/19  1:50 PM   Specimen: Stool  Result Value Ref Range Status   Campylobacter species NOT DETECTED NOT DETECTED Final   Plesimonas shigelloides NOT DETECTED NOT DETECTED  Final   Salmonella species NOT DETECTED NOT DETECTED Final   Yersinia enterocolitica NOT DETECTED NOT DETECTED Final   Vibrio species NOT DETECTED NOT DETECTED Final   Vibrio cholerae NOT DETECTED NOT DETECTED Final   Enteroaggregative E coli (EAEC) NOT DETECTED NOT DETECTED Final   Enteropathogenic E coli (EPEC) NOT DETECTED NOT DETECTED Final   Enterotoxigenic E coli (ETEC) NOT DETECTED NOT DETECTED Final   Shiga like toxin producing E coli (STEC) NOT DETECTED NOT DETECTED Final   Shigella/Enteroinvasive E coli (EIEC) NOT DETECTED NOT DETECTED Final   Cryptosporidium NOT DETECTED NOT DETECTED Final   Cyclospora cayetanensis NOT DETECTED NOT DETECTED Final   Entamoeba histolytica NOT DETECTED NOT DETECTED Final   Giardia lamblia NOT DETECTED NOT DETECTED Final   Adenovirus F40/41 NOT DETECTED NOT DETECTED Final   Astrovirus NOT DETECTED NOT DETECTED Final   Norovirus GI/GII NOT DETECTED NOT DETECTED Final   Rotavirus A NOT DETECTED NOT DETECTED Final   Sapovirus (I, II, IV, and V) NOT DETECTED NOT DETECTED Final    Comment: Performed at [facility], [address] [place], [place] [address]  C difficile quick scan w PCR reflex     Status: None   Collection Time: 02/18/19  1:50 PM   Specimen: Stool  Result Value Ref Range Status   C Diff antigen NEGATIVE NEGATIVE Final   C Diff toxin NEGATIVE NEGATIVE Final   C Diff interpretation No C. difficile detected.  Final    Comment: Performed at [facility], [address] [place], [place] [address]      Scheduled Meds: . aspirin EC  81 mg Oral Daily  .  atorvastatin  20 mg Oral Daily  . cholecalciferol  1,000 Units Oral Daily  . DULoxetine  60 mg Oral Daily  . feeding supplement  1 Container Oral TID BM  . folic acid  1 mg Oral Daily  . heparin  5,000 Units Subcutaneous Q8H  . multivitamin with minerals  1 tablet Oral Daily  . pregabalin  100 mg Oral TID   Continuous Infusions: . sodium chloride 30 mL (02/19/19 1353)  . ciprofloxacin 400 mg (02/19/19 0506)  . famotidine (PEPCID) IV Stopped (02/18/19 1923)  . metronidazole 500 mg (02/19/19 1353)    Assessment/Plan:  1. Severe hypokalemia, hypophosphatemia and hypomagnesemia.  Potassium replaced into the normal range.  Electrolytes better today.  See if he can hold his electrolytes with diet. 2. Relative hypotension discontinue Zanaflex. 3. Generalized weakness and weight loss.  CT scan does not show any signs of malignancy.  Patient did have an endoscopy and colonoscopy that did not show any signs of malignancy.  PSA normal.  Could be some sort of metal absorptive disorder. 4. Colitis on CT scan.  Empiric antibiotics with Cipro and Flagyl.  Stool studies comprehensive panel negative.  Stool for C. difficile negative.  Ova and parasite still pending  5. hyperlipidemia unspecified hold atorvastatin 6. Depression on Cymbalta 7. Neuropathy on Lyrica 8. Weakness physical therapy evaluation 9. Anemia of chronic disease 10. Folate deficiency replace folate 11. Vitamin D deficiency replace vitamin D  Code Status:     Code Status Orders  (From admission, onward)         Start     Ordered   02/16/19 1848  Full code  Continuous     02/16/19 1847        Code Status History    Date Active Date Inactive Code Status Order ID Comments User  Context   09/21/2016 1512 09/23/2016 2135 Full Code [id number]  Epifanio Lesches, MD ED   06/15/2016 2015 06/19/2016 2007 Full Code [id number]  Demetrios Loll, MD Inpatient   Advance Care Planning Activity      Disposition Plan: Potential disposition  tomorrow with GI follow-up as outpatient.  Consultants:  Oncology  Infectious disease  Antibiotics:  Cipro and Flagyl  Time spent: 40 minutes  [place]

## 2019-02-19 NOTE — Progress Notes (Signed)
ID Doing well No diarrhea No nausea or vomiting Appetite better Still has pain abdomen  Patient Vitals for the past 24 hrs:  BP Temp Temp src Pulse Resp SpO2  02/19/19 0454 101/75 98.5 F (36.9 C) Oral 79 16 100 %  02/18/19 1937 99/74 98.5 F (36.9 C) - 81 16 100 %  02/18/19 1454 97/73 (!) 97.5 F (36.4 C) Oral 84 16 99 %    O/e awake and alert, pale chronically ill Chest B/l air entry HS s1s2 Abd soft- some tenderness over the left side Clubbing finger nails   Labs CBC Latest Ref Rng & Units 02/17/2019 02/16/2019 12/11/2018  WBC 4.0 - 10.5 K/uL 8.4 19.0(H) 14.4(H)  Hemoglobin 13.0 - 17.0 g/dL 9.3(L) 13.0 12.6(L)  Hematocrit 39.0 - 52.0 % 27.0(L) 37.5(L) 37.5(L)  Platelets 150 - 400 K/uL 190 338 376     CMP Latest Ref Rng & Units 02/19/2019 02/18/2019 02/17/2019  Glucose 70 - 99 mg/dL 87 88 -  BUN 6 - 20 mg/dL <5(L) <5(L) -  Creatinine 0.61 - 1.24 mg/dL 0.80 0.74 -  Sodium 135 - 145 mmol/L 137 135 -  Potassium 3.5 - 5.1 mmol/L 5.0 4.1 3.0(L)  Chloride 98 - 111 mmol/L 103 104 -  CO2 22 - 32 mmol/L 26 24 -  Calcium 8.9 - 10.3 mg/dL 7.3(L) 7.0(L) -  Total Protein 6.5 - 8.1 g/dL - - -  Total Bilirubin 0.3 - 1.2 mg/dL - - -  Alkaline Phos 38 - 126 U/L - - -  AST 15 - 41 U/L - - -  ALT 0 - 44 U/L - - -    Stool GI PCR panel negative.  C. difficile negative.  Giardia pending Strongyloidiasis pending Celiac panel pending Blood culture negative from 02/16/2019. HIV neg quantiferon gold Pending  Assessment plan  53 year old male presenting with chronic symptoms of weight loss, GI symptoms of diarrhea nausea and vomiting, anemia sensory peripheral neuropathy and hypoalbuminemia.  His hemoglobin is fluctuated with being anemic and improving on iron and now it is low again he had hyponatremia and hypokalemia on presentation. This constellation of symptoms and signs can be seen with malabsorption conditions,  ( celiac, Whipple's disease, amyloidosis ?  Intestinal lymphoma.)    Blood culture neg and hence endocarditis unlikely, also no fever   Tuberculosis is less likely and usually not indolent  for  4 years without being evident or getting worse. QuantiFERON gold has been sent  He had a CT abdomen which did not show any pathology except for wall thickening in the ascending colon.  His pain however is on the left side.  He did have a colonoscopy July 2020 and it showed only  polyps.  He had an upper GI endoscopy.  The small intestines have not been worked up and most of the malabsorption related pathology is usually the small intestines.  May be worth discussing with GI to see whether we could do a barium meal follow-through or some other intestinal studies  Currently on Cipro and Flagyl.  He may have bacterial overgrowth and the antibiotics help with that..  Last day is 02/20/2019  Electrolyte imbalances have been corrected.  He also has low folate, anemia.  Dohle bodies in one peripherally smear- not sure f the significance-can be seen in infection ( if associated with left shift) also in myelodysplasia  Discussed the management with Dr  Leslye Peer ID will sign off- call if needed

## 2019-02-19 NOTE — TOC Initial Note (Signed)
Transition of Care [facility]) - Initial/Assessment Note    Patient Details  Name: Kevin Alexander MRN: [id number] Date of Birth: September 22, 1965  Transition of [facility] CM/SW Contact:    Beverly Sessions, RN Phone Number: 02/19/2019, 12:23 PM  Clinical Narrative:                 Patient admitted from home with hypokalemia.  K is now 5  Patient states that he lives at home with his cousin Marcie Bal who is his primary caregiver.  States that she is there 24/7  PCP [place].  Cousin provides transportation to appointments  Pharmacy _ [place].  Patient denies issues obtaining medications.  PT has assessed patient and recommends home health PT.  Patient agreeable and states he does not have a preference of home health agency.  Heads up referral made to [facility] with [place].   Patient states that he has crutches, cane, RW, BSC, and adjustable bed.  Patient states he has been sleeping in a recliner due to it being closer to the bathroom, and he was having frequent diarrhea     Expected Discharge Plan: [place] Barriers to Discharge: Continued Medical Work up   Patient Goals and CMS Choice   CMS Medicare.gov Compare Post Acute Care list provided to:: Patient Choice offered to / list presented to : Patient  Expected Discharge Plan and Services Expected Discharge Plan: [place]   Discharge Planning Services: CM Consult Post Acute Care Choice: [place] arrangements for the past 2 months: Single Family Home Expected Discharge Date: 02/18/19                         HH Arranged: RN, PT [place] Agency: [place] ([place]) Date [place]: 02/19/19 Time [place]: 1222 Representative spoke with at [place]: Corene Cornea  Prior Living Arrangements/Services Living arrangements for the past 2 months: [place] Lives with:: Relatives Patient language and need for interpreter reviewed:: Yes Do you  feel safe going back to the place where you live?: Yes      Need for Family Participation in Patient Care: Yes (Comment) Care giver support system in place?: Yes (comment) Current home services: DME Criminal Activity/Legal Involvement Pertinent to Current Situation/Hospitalization: No - Comment as needed  Activities of Daily Living Home Assistive Devices/Equipment: Bedside commode/3-in-1 ADL Screening (condition at time of admission) Patient's cognitive ability adequate to safely complete daily activities?: Yes Is the patient deaf or have difficulty hearing?: No Does the patient have difficulty seeing, even when wearing glasses/contacts?: No Does the patient have difficulty concentrating, remembering, or making decisions?: No Patient able to express need for assistance with ADLs?: Yes Does the patient have difficulty dressing or bathing?: No Independently performs ADLs?: Yes (appropriate for developmental age) Does the patient have difficulty walking or climbing stairs?: Yes Weakness of Legs: Both Weakness of Arms/Hands: Both  Permission Sought/Granted                  Emotional Assessment Appearance:: Appears older than stated age, Developmentally appropriate Attitude/Demeanor/Rapport: Gracious Affect (typically observed): Accepting Orientation: : Oriented to Self, Oriented to Place, Oriented to  Time, Oriented to Situation Alcohol / Substance Use: Tobacco Use Psych Involvement: No (comment)  Admission diagnosis:  Hypokalemia [E87.6] Weight loss [R63.4] General weakness [R53.1] Leukocytosis, unspecified type [D72.829] Patient Active Problem List   Diagnosis Date Noted  . Pressure injury  of skin 02/18/2019  . Leukocytosis   . Hypokalemia 02/16/2019  . General weakness 02/16/2019  . Personal history of colonic polyps   . Weight loss   . Polyp of ascending colon   . Nausea and vomiting   . Bicuspid aortic valve 12/31/2017  . Problems with swallowing and mastication    . Stricture and stenosis of esophagus   . Gastritis without bleeding   . Neuropathy 09/03/2017  . Bilateral foot pain 09/03/2017  . Chronic pain syndrome 09/03/2017  . Aortic ejection murmur 05/09/2017  . 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 Hyperlipidemia, mixed 03/19/2017  . Bilateral pain of leg and foot 03/15/2017  . Numbness and tingling of foot 03/15/2017  . History of left hip replacement 12/07/2016  . Chronic pain of both knees 12/07/2016  . Chronic cough 11/27/2016  . Weight loss, abnormal 11/27/2016  . Iron deficiency anemia 11/01/2016  . Anemia, unspecified 10/20/2016  . Protein-calorie malnutrition, severe 09/22/2016  . Hyponatremia with decreased serum osmolality 09/21/2016  . Intractable nausea and vomiting 06/15/2016  . Deviated nasal septum 04/13/2015  . Neck pain 08/11/2014  . Bilateral lower extremity pain 07/21/2014  . Hypertension 05/28/2014  . Restless leg syndrome 05/28/2014  . Restless legs syndrome 05/28/2014  . Tobacco abuse 05/28/2014  . Left knee pain 04/16/2014  . Asbestos exposure 01/05/2014  . Shortness of breath 01/05/2014  . GERD (gastroesophageal reflux disease) 02/12/2013  . Hearing impairment 02/12/2013  . Pain in joint, pelvic region and thigh 12/19/2012  . Bursitis of hip 08/26/2012  . Primary localized osteoarthrosis, pelvic region and thigh 06/28/2012  . Hip pain 10/09/2011  . Retained orthopedic hardware 10/09/2011  . Femur fracture, left ([place]) 08/21/2011  . Hip arthritis 08/21/2011   PCP:  Sallee Lange, NP Pharmacy:   [id number] [place], [address] [place] [address] Phone: [phone] Fax: [phone]     Social Determinants of Health (SDOH) Interventions    Readmission Risk Interventions No flowsheet data found.

## 2019-02-19 NOTE — Progress Notes (Signed)
Nutrition Follow Up Note   DOCUMENTATION CODES:   Severe malnutrition in context of chronic illness  INTERVENTION:   Boost Breeze po TID, each supplement provides 250 kcal and 9 grams of protein  Magic cup TID with meals, each supplement provides 290 kcal and 9 grams of protein  MVI daily  Folic acid 1mg  daily   Recommend cholecalciferol 1000 units po daily    Thiamine, copper, B6, zinc and ascorbic acid labs pending  NUTRITION DIAGNOSIS:   Severe Malnutrition related to chronic illness(nausea, vomiting, poor appetite, COPD) as evidenced by 29 percent weight loss in one year, severe muscle depletion, severe fat depletion.  GOAL:   Patient will meet greater than or equal to 90% of their needs  -progressing   MONITOR:   PO intake, Supplement acceptance, Labs, Weight trends, Skin, I & O's  ASSESSMENT:   53 y.o. male with a known history of anemia, aortic valve disorders, arthritis, asthma, COPD, depression, gastroesophageal reflux disease, myocardial infarction, sleep apnea, peptic ulcer disease with multiple esophageal dilations admitted with weakness, nausea and vomiting   Spoke with pt via phone today, pt reports his appetite is improving; pt reports eating all of his breakfast this morning except for one bite and he has already ordered lunch. Pt was unable to tolerate Ensure supplements as he reports they made his diarrhea worse. Pt would like to try Boost Breeze instead. Pt reports his diarrhea is improving. Pt's vitamin D and folate labs returned low; will start supplementation. Several other vitamin labs are still pending. Pt would like his vitamin lab results sent to his primary care provider Gaetano Net so she can continue with supplementation after discharge.   Medications reviewed and include: aspirin, folic acid, heparin, MVI, ciprofloxacin, pepcid, metronidazole   Labs reviewed: K 5.0 wnl, P 4.2 wnl, Mg 1.9 wnl Iron 51, ferritin 509(H), B12 321- 8/31 Folate  2.9(L)- 9/1 Vitamin D, 25-hydroxy 12.2(L)- 9/1  Diet Order:   Diet Order            Diet regular Room service appropriate? Yes; Fluid consistency: Thin  Diet effective now             EDUCATION NEEDS:   Education needs have been addressed  Skin:  Skin Assessment: Reviewed RN Assessment  Last BM:  9/2- type 7  Height:   Ht Readings from Last 1 Encounters:  02/16/19 5\' 7"  (1.702 m)    Weight:   Wt Readings from Last 1 Encounters:  02/16/19 45.4 kg    Ideal Body Weight:  67.2 kg  BMI:  Body mass index is 15.66 kg/m.  Estimated Nutritional Needs:   Kcal:  1600-1800kcal/day  Protein:  80-90g/day  Fluid:  >1.4L/day  Koleen Distance MS, RD, LDN Pager #- [phone] Office#- [phone] After Hours Pager: [phone]

## 2019-02-19 NOTE — Progress Notes (Signed)
Pharmacy Electrolyte Monitoring Consult:  Pharmacy consulted to assist in monitoring and replacing electrolytes in this 53 y.o. male admitted on 02/16/2019 with Weakness and Altered Mental Status malnutrition  Labs:  Sodium (mmol/L)  Date Value  02/19/2019 137  07/02/2018 142  12/25/2013 138   Potassium (mmol/L)  Date Value  02/19/2019 5.0  12/25/2013 2.9 (L)   Magnesium (mg/dL)  Date Value  02/18/2019 1.9   Phosphorus (mg/dL)  Date Value  02/19/2019 4.2   Calcium (mg/dL)  Date Value  02/19/2019 7.3 (L)   Calcium, Total (mg/dL)  Date Value  12/25/2013 8.8   Albumin (g/dL)  Date Value  02/16/2019 2.5 (L)  07/02/2018 3.7   Assessment/Plan:   9/2 K 5.0.  Goal wnl.  Level currently therapeutic.  Will f/u with K level in the morning.  9/2 Phos 4.2.  Goal wnl.  Level currently therapeutic.   Corrected calcium 8.5.  No supplementation needed at this time.  F/u electrolytes with am labs  Gerald Dexter, PharmD Pharmacy Resident  02/19/2019 8:33 AM
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
## 2019-02-20 DIAGNOSIS — R197 Diarrhea, unspecified: Secondary | ICD-10-CM

## 2019-02-20 LAB — CELIAC DISEASE PANEL
Endomysial Ab, IgA: NEGATIVE
IgA: 322 mg/dL (ref 90–386)
Tissue Transglutaminase Ab, IgA: <2 U/mL (ref 0–3)

## 2019-02-20 LAB — MISC LABCORP TEST (SEND OUT): Labcorp test code: 1805

## 2019-02-20 LAB — STRONGYLOIDES, AB, IGG: Strongyloides, Ab, IgG: POSITIVE — AB

## 2019-02-20 LAB — RENAL FUNCTION PANEL
Albumin: 1.9 g/dL — ABNORMAL LOW (ref 3.5–5.0)
Anion gap: 8 (ref 5–15)
BUN: <5 mg/dL — ABNORMAL LOW (ref 6–20)
CO2: 26 mmol/L (ref 22–32)
Calcium: 7.5 mg/dL — ABNORMAL LOW (ref 8.9–10.3)
Chloride: 102 mmol/L (ref 98–111)
Creatinine, Ser: 0.86 mg/dL (ref 0.61–1.24)
GFR calc Af Amer: >60 mL/min (ref >60)
GFR calc non Af Amer: >60 mL/min (ref >60)
Glucose, Bld: 86 mg/dL (ref 70–99)
Phosphorus: 3.1 mg/dL (ref 2.5–4.6)
Potassium: 3.3 mmol/L — ABNORMAL LOW (ref 3.5–5.1)
Sodium: 136 mmol/L (ref 135–145)

## 2019-02-20 LAB — QUANTIFERON-TB GOLD PLUS (RQFGPL)
QuantiFERON Mitogen Value: 0.47 IU/mL
QuantiFERON Nil Value: 0.04 IU/mL
QuantiFERON TB1 Ag Value: 0.04 IU/mL
QuantiFERON TB2 Ag Value: 0.05 IU/mL

## 2019-02-20 LAB — RETIC PANEL
Immature Retic Fract: 9.7 % (ref 2.3–15.9)
RBC.: 3.1 MIL/uL — ABNORMAL LOW (ref 4.22–5.81)
Retic Count, Absolute: 37.2 10*3/uL (ref 19.0–186.0)
Retic Ct Pct: 1.2 % (ref 0.4–3.1)
Reticulocyte Hemoglobin: 30.9 pg (ref >27.9)

## 2019-02-20 LAB — BRAIN NATRIURETIC PEPTIDE: B Natriuretic Peptide: 75 pg/mL (ref 0.0–100.0)

## 2019-02-20 LAB — QUANTIFERON-TB GOLD PLUS: QuantiFERON-TB Gold Plus: UNDETERMINED — AB

## 2019-02-20 LAB — MAGNESIUM: Magnesium: 1.6 mg/dL — ABNORMAL LOW (ref 1.7–2.4)

## 2019-02-20 MED ORDER — POTASSIUM CHLORIDE CRYS ER 20 MEQ PO TBCR
40.0000 meq | EXTENDED_RELEASE_TABLET | Freq: Every day | ORAL | Status: DC
  Administered 2019-02-20: 40 meq via ORAL
  Filled 2019-02-20: qty 2

## 2019-02-20 MED ORDER — VITAMIN D3 25 MCG PO TABS: 1000.0000 [IU] | ORAL_TABLET | Freq: Every day | ORAL | 0 refills | Status: DC

## 2019-02-20 MED ORDER — FOLIC ACID 1 MG PO TABS: 1.0000 mg | ORAL_TABLET | Freq: Every day | ORAL | 0 refills | Status: DC

## 2019-02-20 MED ORDER — FAMOTIDINE 20 MG PO TABS: 20.0000 mg | ORAL_TABLET | Freq: Every day | ORAL | Status: DC

## 2019-02-20 MED ORDER — MAGNESIUM SULFATE 2 GM/50ML IV SOLN
2.0000 g | Freq: Once | INTRAVENOUS | Status: AC
  Administered 2019-02-20: 2 g via INTRAVENOUS
  Filled 2019-02-20: qty 50

## 2019-02-20 MED ORDER — MAGNESIUM OXIDE -MG SUPPLEMENT 250 MG PO TABS: 250.0000 mg | ORAL_TABLET | Freq: Every morning | ORAL | 0 refills | Status: DC

## 2019-02-20 MED ORDER — HYDROCODONE-ACETAMINOPHEN 5-325 MG PO TABS: 1.0000 | ORAL_TABLET | Freq: Four times a day (QID) | ORAL | 0 refills | Status: DC | PRN

## 2019-02-20 MED ORDER — CIPROFLOXACIN HCL 500 MG PO TABS: 500.0000 mg | ORAL_TABLET | Freq: Two times a day (BID) | ORAL | 0 refills | Status: DC

## 2019-02-20 MED ORDER — METRONIDAZOLE 250 MG PO TABS: 250.0000 mg | ORAL_TABLET | Freq: Three times a day (TID) | ORAL | 0 refills | Status: AC

## 2019-02-20 MED ORDER — POTASSIUM CHLORIDE ER 20 MEQ PO TBCR: 20.0000 meq | EXTENDED_RELEASE_TABLET | Freq: Every day | ORAL | 0 refills | Status: DC

## 2019-02-20 MED ORDER — ONDANSETRON HCL 4 MG PO TABS: 4.0000 mg | ORAL_TABLET | Freq: Three times a day (TID) | ORAL | 0 refills | Status: DC | PRN

## 2019-02-20 MED ORDER — FAMOTIDINE 20 MG PO TABS: 20.0000 mg | ORAL_TABLET | Freq: Two times a day (BID) | ORAL | 0 refills | Status: DC

## 2019-02-20 MED ORDER — ALUM & MAG HYDROXIDE-SIMETH 200-200-20 MG/5ML PO SUSP
30.0000 mL | Freq: Four times a day (QID) | ORAL | Status: DC | PRN
  Administered 2019-02-20: 30 mL via ORAL
  Filled 2019-02-20: qty 30
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
## 2019-02-20 NOTE — Progress Notes (Signed)
Patient demonstrated understanding for his care and through teach back demonstrated adequate knowledge of medication he now needs to take. Patient's significant other was present during this time. IV was removed and due to bleeding pressure was applied for a couple minutes. Pt was discharged.

## 2019-02-20 NOTE — Discharge Instructions (Signed)
[facility] Stay  You were found to have multiple vitamin deficiencies during your hospital stay. You will need to take vitamin supplementation after discharge and plan to have your vitamin labs rechecked in 2-3 months.   Continue at home:  Vitamin C 500mg  twice daily Folic acid 1mg  daily  Vitamin D3 (cholecalciferol) 1000 units daily Multivitamin with iron daily

## 2019-02-20 NOTE — Progress Notes (Addendum)
Pharmacy Electrolyte Monitoring Consult:  Pharmacy consulted to assist in monitoring and replacing electrolytes in this 53 y.o. male previously seen in GI clinic for nausea vomiting and diarrhea and underwent EGD and colonoscopy, admitted with ongoing weight loss and nausea vomiting diarrhea.  Labs:  Sodium (mmol/L)  Date Value  02/20/2019 136  07/02/2018 142  12/25/2013 138   Potassium (mmol/L)  Date Value  02/20/2019 3.3 (L)  12/25/2013 2.9 (L)   Magnesium (mg/dL)  Date Value  02/20/2019 1.6 (L)   Phosphorus (mg/dL)  Date Value  02/20/2019 3.1   Calcium (mg/dL)  Date Value  02/20/2019 7.5 (L)   Calcium, Total (mg/dL)  Date Value  12/25/2013 8.8   Albumin (g/dL)  Date Value  02/20/2019 1.9 (L)  07/02/2018 3.7   Assessment/Plan:   9/3 K 3.3.  Goal wnl.  Level currently subtherapeutic.  KCl 40 mEq PO daily ordered by MD.  Will monitor with AM labs.  9/3 Mg 1.6.  Goal wnl.  Level currently subtherapeutic.  Magnesium sulfate IV 2 g x 1 dose ordered by MD.  Will monitor with AM labs  9/3 Corrected calcium 9.2.  No supplementation needed at this time.  9/3 Phos 3.1.  Goal wnl.  Currently therapeutic.  F/u electrolytes with am labs  Gerald Dexter, PharmD Pharmacy Resident  02/20/2019 7:30 AM
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
## 2019-02-20 NOTE — TOC Transition Note (Signed)
Transition of Care [facility]) - CM/SW Discharge Note   Patient Details  Name: CEDELL COWFER MRN: [id number] Date of Birth: 12/16/1965  Transition of [facility] CM/SW Contact:  Beverly Sessions, RN Phone Number: 02/20/2019, 1:51 PM   Clinical Narrative:    Patient to discharge today  Corene Cornea with [place] notified of discharge    Final next level of care: [place] Barriers to Discharge: Barriers Resolved   Patient Goals and CMS Choice   CMS Medicare.gov Compare Post Acute Care list provided to:: Patient Choice offered to / list presented to : Patient  Discharge Placement                       Discharge Plan and Services   Discharge Planning Services: CM Consult Post Acute Care Choice: Home Health                    HH Arranged: RN, PT, OT, Nurse's Aide [facility] Agency: [place] ([facility]) Date [place]: 02/20/19 Time [place]: 1351 Representative spoke with at [place]: [place] ([place]) Interventions     Readmission Risk Interventions No flowsheet data found.

## 2019-02-21 LAB — CULTURE, BLOOD (ROUTINE X 2)
Culture: NO GROWTH
Culture: NO GROWTH

## 2019-02-21 LAB — COPPER, SERUM: Copper: 43 ug/dL — ABNORMAL LOW (ref 72–166)

## 2019-02-21 LAB — ZINC: Zinc: 66 ug/dL (ref 56–134)

## 2019-02-22 LAB — VITAMIN B1: Vitamin B1 (Thiamine): 28.9 nmol/L — ABNORMAL LOW (ref 66.5–200.0)

## 2019-02-24 NOTE — Progress Notes (Signed)
Patient ID: Kevin Alexander, male   DOB: 1965-10-01, 53 y.o.   MRN: [id number]  Thiamine level low  Called pharmacy and left a message for thiamine 100 mg 2 tablets daily for 3 days then 1 tablet daily after that.  Spoke with Marcie Bal on the phone.  Gave her an update that I will call the pharmacy with this prescription.  She stated that the patient is doing well walking around and eating better.  Dr Loletha Grayer

## 2019-02-25 ENCOUNTER — Telehealth: Payer: Self-pay | Admitting: Gastroenterology

## 2019-02-25 LAB — GIARDIA, EIA; OVA/PARASITE: Giardia Ag, Stl: NEGATIVE

## 2019-02-25 LAB — MISC LABCORP TEST (SEND OUT): Labcorp test code: 4655

## 2019-02-25 LAB — UPEP/UIFE/LIGHT CHAINS/TP, 24-HR UR
% BETA, Urine: 0 %
ALPHA 1 URINE: 0 %
Albumin, U: 0 %
Alpha 2, Urine: 0 %
Free Kappa Lt Chains,Ur: 40.92 mg/L (ref 0.63–113.79)
Free Kappa/Lambda Ratio: 8.53 (ref 1.03–31.76)
Free Lambda Lt Chains,Ur: 4.8 mg/L (ref 0.47–11.77)
GAMMA GLOBULIN URINE: 0 %
Total Protein, Urine-Ur/day: <160 mg/24 hr — ABNORMAL HIGH (ref 30–150)
Total Protein, Urine: <4 mg/dL
Total Volume: 4000

## 2019-02-25 LAB — O&P RESULT

## 2019-02-25 NOTE — Telephone Encounter (Signed)
Left vm to offer apt  Per Dr. Mcneil Sober note

## 2019-02-25 NOTE — Telephone Encounter (Signed)
Pt wife left vm pt was recently hospitalized and they wanted him to  do another  EGD and Biopsy  Of small intestine please call pt to schedule this

## 2019-02-25 NOTE — Telephone Encounter (Signed)
-----   Message from Virgel Manifold, MD sent at 02/21/2019  3:54 PM EDT -----  Please set up clinic appointment with Dr. [place] Norris in 2-4 weeks

## 2019-02-26 LAB — COMP PANEL: LEUKEMIA/LYMPHOMA

## 2019-02-28 ENCOUNTER — Encounter: Payer: Self-pay | Admitting: Dietician

## 2019-02-28 ENCOUNTER — Telehealth: Payer: Self-pay | Admitting: Dietician

## 2019-02-28 NOTE — Progress Notes (Signed)
Nutrition Documentation Note   Received vitamin lab results:  [place] 43(L)-   Ref 72-166ug/dL B1 (thiamine) 28.9(L)-   Ref 66.5-200nmol/L Vitamin D, 25-Hydroxy 12.2(L)- Ref 30-100 ng/mL B12- 321 pg/mL Zinc- 66- Ref 56-136 ug/dL B6- <1.0(L)- Ref 5.3-46.7 ug/L Vitamin C- 0.1 mg/dL- Ref 0.4-2.0 mg/dL Folate 2.9(L)- Ref >5.9 ng/mL  Recommend:  Vitamin C 500mg  po BID Vitamin D3 (cholecalciferol)- 1000 units po daily Copper 2mg  po daily x 10 days B-complex vitamin po daily Thiamine 100mg  po daily Folic acid 1mg  po daily  Multivitamin with iron daily   Recommend patient have vitamin labs rechecked in 30 days. If labs remain low would recommend parenteral administration of vitamins as there is some concern for malabsorption per MD note.   Koleen Distance MS, RD, LDN Pager #- [phone] Office#- [phone] After Hours Pager: [phone]

## 2019-03-03 DIAGNOSIS — E538 Deficiency of other specified B group vitamins: Secondary | ICD-10-CM

## 2019-03-03 DIAGNOSIS — D72829 Elevated white blood cell count, unspecified: Secondary | ICD-10-CM

## 2019-03-03 DIAGNOSIS — R627 Adult failure to thrive: Secondary | ICD-10-CM | POA: Insufficient documentation

## 2019-03-03 DIAGNOSIS — R112 Nausea with vomiting, unspecified: Secondary | ICD-10-CM

## 2019-03-03 DIAGNOSIS — Z86010 Personal history of colonic polyps: Secondary | ICD-10-CM

## 2019-03-03 HISTORY — DX: Elevated white blood cell count, unspecified: D72.829

## 2019-03-04 ENCOUNTER — Other Ambulatory Visit: Payer: Self-pay

## 2019-03-04 ENCOUNTER — Ambulatory Visit (INDEPENDENT_AMBULATORY_CARE_PROVIDER_SITE_OTHER): Payer: Medicare Other | Admitting: Gastroenterology

## 2019-03-04 VITALS — BP 125/79 | HR 103 | Temp 98.4°F | Ht 67.0 in | Wt 122.4 lb

## 2019-03-04 DIAGNOSIS — R634 Abnormal weight loss: Secondary | ICD-10-CM

## 2019-03-04 DIAGNOSIS — R112 Nausea with vomiting, unspecified: Secondary | ICD-10-CM | POA: Diagnosis not present

## 2019-03-04 HISTORY — DX: Hypomagnesemia: E83.42

## 2019-03-04 NOTE — H&P (View-Only) (Signed)
Primary Care Physician: Sallee Lange, NP  Primary Gastroenterologist:  Dr. Lucilla Lame  Chief Complaint  Patient presents with   Follow up ER    HPI: Kevin Alexander is a 53 y.o. male here for follow-up after being in the hospital.  The patient was admitted with nausea vomiting diarrhea.  The patient had reported that his symptoms had been quiet for the last 5 to 6 months.  He had a colonoscopy and EGD in the past for these symptoms.  It was recommended by Dr. Bonna Gains and infectious disease that "small bowel biopsies to rule out Whipple's disease, celiac disease, intestinal biopsy sent for PAS staining, immunohistochemistry and PCR for Whipple's disease. [place] red for amyloidosis." The patient has been eating well since he got out of the hospital and states that he has had no further nausea vomiting or diarrhea.   Current Outpatient Medications  Medication Sig Dispense Refill   Alpha-Lipoic Acid 600 MG CAPS Take 1 capsule (600 mg total) by mouth daily. 30 capsule 2   aspirin EC 81 MG tablet Take 81 mg by mouth daily.      atorvastatin (LIPITOR) 20 MG tablet Take 20 mg by mouth daily.      cholecalciferol (VITAMIN D) 25 MCG tablet Take 1 tablet (1,000 Units total) by mouth daily. 30 tablet 0   ciprofloxacin (CIPRO) 500 MG tablet Take 1 tablet (500 mg total) by mouth 2 (two) times daily. 5 tablet 0   famotidine (PEPCID) 20 MG tablet Take 1 tablet (20 mg total) by mouth 2 (two) times daily. 60 tablet 0   ferrous sulfate (SLOW RELEASE IRON) 160 (50 Fe) MG TBCR SR tablet Take 160 mg by mouth daily.     fexofenadine (ALLEGRA) 180 MG tablet Take 180 mg by mouth every morning.  0   folic acid (FOLVITE) 1 MG tablet Take 1 tablet (1 mg total) by mouth daily. 30 tablet 0   HYDROcodone-acetaminophen (NORCO/VICODIN) 5-325 MG tablet Take 1 tablet by mouth every 6 (six) hours as needed for moderate pain. 10 tablet 0   levocetirizine (XYZAL) 5 MG tablet Take 5 mg by mouth  every evening.     Magnesium Oxide -Mg Supplement ([employer] MAGNESIUM OXIDE) 250 MG TABS Take 1 tablet (250 mg total) by mouth every morning. 30 tablet 0   ondansetron (ZOFRAN) 4 MG tablet Take 1 tablet (4 mg total) by mouth every 8 (eight) hours as needed for nausea or vomiting. 20 tablet 0   potassium chloride 20 MEQ TBCR Take 20 mEq by mouth daily. 30 tablet 0   pregabalin (LYRICA) 100 MG capsule Take 100 mg by mouth 3 (three) times daily.     DULoxetine (CYMBALTA) 60 MG capsule Take 60 mg by mouth daily.      No current facility-administered medications for this visit.     Allergies as of 03/04/2019 - Review Complete 03/04/2019  Allergen Reaction Noted   Fluconazole Rash 09/22/2016   Gabapentin Other (See Comments) 11/03/2016   Pantoprazole Other (See Comments) 08/24/2015   Amlodipine Itching and Rash 05/22/2013    ROS:  General: Negative for anorexia, weight loss, fever, chills, fatigue, weakness. ENT: Negative for hoarseness, difficulty swallowing , nasal congestion. CV: Negative for chest pain, angina, palpitations, dyspnea on exertion, peripheral edema.  Respiratory: Negative for dyspnea at rest, dyspnea on exertion, cough, sputum, wheezing.  GI: See history of present illness. GU:  Negative for dysuria, hematuria, urinary incontinence, urinary frequency, nocturnal urination.  Endo: Negative for  unusual weight change.    Physical Examination:   BP 125/79    Pulse (!) 103    Temp 98.4 F (36.9 C) (Oral)    Ht 5\' 7"  (1.702 m)    Wt 122 lb 6.4 oz (55.5 kg)    BMI 19.17 kg/m   General: Well-nourished, well-developed in no acute distress.  Eyes: No icterus. Conjunctivae pink. Lungs: Clear to auscultation bilaterally. Non-labored. Heart: Regular rate and rhythm, no murmurs rubs or gallops.  Abdomen: Bowel sounds are normal, nontender, nondistended, no hepatosplenomegaly or masses, no abdominal bruits or hernia , no rebound or guarding.   Extremities: No lower extremity  edema. No clubbing or deformities. Neuro: Alert and oriented x 3.  Grossly intact. Skin: Warm and dry, no jaundice.   Psych: Alert and cooperative, normal mood and affect.  Labs:    Imaging Studies: Ct Head Wo Contrast  Result Date: 02/16/2019 CLINICAL DATA:  Confusion.  Weight loss. EXAM: CT HEAD WITHOUT CONTRAST TECHNIQUE: Contiguous axial images were obtained from the base of the skull through the vertex without intravenous contrast. COMPARISON:  None. FINDINGS: Brain: No subdural, epidural, or subarachnoid hemorrhage identified. There is encephalomalacia in the medial inferior left frontal lobe on series 2, image 79 and sagittal image 29 consistent with a previous infarct. No other infarcts are identified. No acute ischemia is noted. No mass effect or midline shift. Ventricles and sulci are normal. Cerebellum, brainstem, and basal cisterns are normal. Vascular: No hyperdense vessel or unexpected calcification. Skull: Normal. Negative for fracture or focal lesion. Sinuses/Orbits: No acute finding. Other: None. IMPRESSION: 1. No acute intracranial abnormalities identified. 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 Electronically Signed   By: Dorise Bullion III M.D   On: 02/16/2019 17:11   Ct Chest W Contrast  Result Date: 02/16/2019 CLINICAL DATA:  Confusion. Muscle weakness. Altered mental status. Pallor. Nausea and vomiting. EXAM: CT CHEST, ABDOMEN, AND PELVIS WITH CONTRAST TECHNIQUE: Multidetector CT imaging of the chest, abdomen and pelvis was performed following the standard protocol during bolus administration of intravenous contrast. CONTRAST:  129mL OMNIPAQUE IOHEXOL 300 MG/ML  SOLN COMPARISON:  Multiple exams, including CT pelvis 05/31/2018; CT chest from 04/26/2017; CT abdomen from 08/23/2015 FINDINGS: CT CHEST FINDINGS Cardiovascular: Unremarkable Mediastinum/Nodes: AP window lymph node, 1.0 cm in short axis on  image 22/2, formerly the same by my measurements. Right hilar lymph node 0.9 cm in short axis on image 26/2, stable. Left para-aortic lymph node 0.5 cm in short axis on image 42/2, stable. Lungs/Pleura: Paraseptal emphysema. Bilateral airway thickening. Small calcified granuloma in the right upper lobe on image 30/4. Musculoskeletal: Unremarkable CT ABDOMEN PELVIS FINDINGS Hepatobiliary: Old granulomatous disease. Gallbladder unremarkable. Mild focal steatosis along the falciform ligament. Pancreas: Unremarkable Spleen: Unremarkable Adrenals/Urinary Tract: 2 mm right kidney lower pole nonobstructive renal calculus on image 54/5. Adrenal glands normal. The kidneys appear otherwise normal. Left-sided urinary bladder at left distal ureter obscured by streak artifact from the patient's left hip implant. Stomach/Bowel: Wall thickening in the ascending colon. Normal appendix. There are few scattered air-fluid levels in nondilated small bowel loops. Vascular/Lymphatic: Mild iliac artery atherosclerotic calcification. No pathologic adenopathy in the abdomen. Reproductive: Unremarkable. Other: No supplemental non-categorized findings. Musculoskeletal: Hernia mesh along the lower pelvic anterior abdominal wall. Left total hip prosthesis. Transitional lumbosacral vertebra. IMPRESSION: 1. Wall thickening in the ascending colon suspicious for colitis. Normal appendix. 2. There are a few scattered air-fluid levels in nondilated  small bowel, strictly speaking, antritis is not excluded. No gas in the bowel wall or extraluminal gas. 3.  Emphysema (ICD10-J43.9). 4. Stable upper normal sized right hilar and AP window lymph nodes. 5. Airway thickening is present, suggesting bronchitis or reactive airways disease. 6. Nonobstructive right nephrolithiasis. Electronically Signed   By: Van Clines M.D.   On: 02/16/2019 17:21   Ct Abdomen Pelvis W Contrast  Result Date: 02/16/2019 CLINICAL DATA:  Confusion. Muscle weakness.  Altered mental status. Pallor. Nausea and vomiting. EXAM: CT CHEST, ABDOMEN, AND PELVIS WITH CONTRAST TECHNIQUE: Multidetector CT imaging of the chest, abdomen and pelvis was performed following the standard protocol during bolus administration of intravenous contrast. CONTRAST:  154mL OMNIPAQUE IOHEXOL 300 MG/ML  SOLN COMPARISON:  Multiple exams, including CT pelvis 05/31/2018; CT chest from 04/26/2017; CT abdomen from 08/23/2015 FINDINGS: CT CHEST FINDINGS Cardiovascular: Unremarkable Mediastinum/Nodes: AP window lymph node, 1.0 cm in short axis on image 22/2, formerly the same by my measurements. Right hilar lymph node 0.9 cm in short axis on image 26/2, stable. Left para-aortic lymph node 0.5 cm in short axis on image 42/2, stable. Lungs/Pleura: Paraseptal emphysema. Bilateral airway thickening. Small calcified granuloma in the right upper lobe on image 30/4. Musculoskeletal: Unremarkable CT ABDOMEN PELVIS FINDINGS Hepatobiliary: Old granulomatous disease. Gallbladder unremarkable. Mild focal steatosis along the falciform ligament. Pancreas: Unremarkable Spleen: Unremarkable Adrenals/Urinary Tract: 2 mm right kidney lower pole nonobstructive renal calculus on image 54/5. Adrenal glands normal. The kidneys appear otherwise normal. Left-sided urinary bladder at left distal ureter obscured by streak artifact from the patient's left hip implant. Stomach/Bowel: Wall thickening in the ascending colon. Normal appendix. There are few scattered air-fluid levels in nondilated small bowel loops. Vascular/Lymphatic: Mild iliac artery atherosclerotic calcification. No pathologic adenopathy in the abdomen. Reproductive: Unremarkable. Other: No supplemental non-categorized findings. Musculoskeletal: Hernia mesh along the lower pelvic anterior abdominal wall. Left total hip prosthesis. Transitional lumbosacral vertebra. IMPRESSION: 1. Wall thickening in the ascending colon suspicious for colitis. Normal appendix. 2. There are a  few scattered air-fluid levels in nondilated small bowel, strictly speaking, antritis is not excluded. No gas in the bowel wall or extraluminal gas. 3.  Emphysema (ICD10-J43.9). 4. Stable upper normal sized right hilar and AP window lymph nodes. 5. Airway thickening is present, suggesting bronchitis or reactive airways disease. 6. Nonobstructive right nephrolithiasis. Electronically Signed   By: Van Clines M.D.   On: 02/16/2019 17:21    Assessment and Plan:   Kevin Alexander is a 53 y.o. y/o male who comes in today for follow-up after being discharged from the hospital.  The patient's symptoms have completely resolved.  I have been asked to follow with the patient for repeat EGD by infectious disease for testing for possible Whipple's disease.  The patient's antibodies have been negative for celiac sprue.  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  The patient has been set up for an EGD with small bowel biopsies.  The patient has been explained the plan and agrees with it.    Lucilla Lame, MD. [facility]   Note: This dictation was prepared with Dragon dictation along with smaller phrase technology. Any transcriptional errors that result from this process are unintentional.

## 2019-03-04 NOTE — Progress Notes (Signed)
Primary Care Physician: Sallee Lange, NP  Primary Gastroenterologist:  Dr. Lucilla Lame  Chief Complaint  Patient presents with   Follow up ER    HPI: Kevin Alexander is a 53 y.o. male here for follow-up after being in the hospital.  The patient was admitted with nausea vomiting diarrhea.  The patient had reported that his symptoms had been quiet for the last 5 to 6 months.  He had a colonoscopy and EGD in the past for these symptoms.  It was recommended by Dr. Bonna Gains and infectious disease that "small bowel biopsies to rule out Whipple's disease, celiac disease, intestinal biopsy sent for PAS staining, immunohistochemistry and PCR for Whipple's disease. [place] red for amyloidosis." The patient has been eating well since he got out of the hospital and states that he has had no further nausea vomiting or diarrhea.   Current Outpatient Medications  Medication Sig Dispense Refill   Alpha-Lipoic Acid 600 MG CAPS Take 1 capsule (600 mg total) by mouth daily. 30 capsule 2   aspirin EC 81 MG tablet Take 81 mg by mouth daily.      atorvastatin (LIPITOR) 20 MG tablet Take 20 mg by mouth daily.      cholecalciferol (VITAMIN D) 25 MCG tablet Take 1 tablet (1,000 Units total) by mouth daily. 30 tablet 0   ciprofloxacin (CIPRO) 500 MG tablet Take 1 tablet (500 mg total) by mouth 2 (two) times daily. 5 tablet 0   famotidine (PEPCID) 20 MG tablet Take 1 tablet (20 mg total) by mouth 2 (two) times daily. 60 tablet 0   ferrous sulfate (SLOW RELEASE IRON) 160 (50 Fe) MG TBCR SR tablet Take 160 mg by mouth daily.     fexofenadine (ALLEGRA) 180 MG tablet Take 180 mg by mouth every morning.  0   folic acid (FOLVITE) 1 MG tablet Take 1 tablet (1 mg total) by mouth daily. 30 tablet 0   HYDROcodone-acetaminophen (NORCO/VICODIN) 5-325 MG tablet Take 1 tablet by mouth every 6 (six) hours as needed for moderate pain. 10 tablet 0   levocetirizine (XYZAL) 5 MG tablet Take 5 mg by mouth  every evening.     Magnesium Oxide -Mg Supplement ([employer] MAGNESIUM OXIDE) 250 MG TABS Take 1 tablet (250 mg total) by mouth every morning. 30 tablet 0   ondansetron (ZOFRAN) 4 MG tablet Take 1 tablet (4 mg total) by mouth every 8 (eight) hours as needed for nausea or vomiting. 20 tablet 0   potassium chloride 20 MEQ TBCR Take 20 mEq by mouth daily. 30 tablet 0   pregabalin (LYRICA) 100 MG capsule Take 100 mg by mouth 3 (three) times daily.     DULoxetine (CYMBALTA) 60 MG capsule Take 60 mg by mouth daily.      No current facility-administered medications for this visit.     Allergies as of 03/04/2019 - Review Complete 03/04/2019  Allergen Reaction Noted   Fluconazole Rash 09/22/2016   Gabapentin Other (See Comments) 11/03/2016   Pantoprazole Other (See Comments) 08/24/2015   Amlodipine Itching and Rash 05/22/2013    ROS:  General: Negative for anorexia, weight loss, fever, chills, fatigue, weakness. ENT: Negative for hoarseness, difficulty swallowing , nasal congestion. CV: Negative for chest pain, angina, palpitations, dyspnea on exertion, peripheral edema.  Respiratory: Negative for dyspnea at rest, dyspnea on exertion, cough, sputum, wheezing.  GI: See history of present illness. GU:  Negative for dysuria, hematuria, urinary incontinence, urinary frequency, nocturnal urination.  Endo: Negative for  unusual weight change.    Physical Examination:   BP 125/79    Pulse (!) 103    Temp 98.4 F (36.9 C) (Oral)    Ht 5\' 7"  (1.702 m)    Wt 122 lb 6.4 oz (55.5 kg)    BMI 19.17 kg/m   General: Well-nourished, well-developed in no acute distress.  Eyes: No icterus. Conjunctivae pink. Lungs: Clear to auscultation bilaterally. Non-labored. Heart: Regular rate and rhythm, no murmurs rubs or gallops.  Abdomen: Bowel sounds are normal, nontender, nondistended, no hepatosplenomegaly or masses, no abdominal bruits or hernia , no rebound or guarding.   Extremities: No lower extremity  edema. No clubbing or deformities. Neuro: Alert and oriented x 3.  Grossly intact. Skin: Warm and dry, no jaundice.   Psych: Alert and cooperative, normal mood and affect.  Labs:    Imaging Studies: Ct Head Wo Contrast  Result Date: 02/16/2019 CLINICAL DATA:  Confusion.  Weight loss. EXAM: CT HEAD WITHOUT CONTRAST TECHNIQUE: Contiguous axial images were obtained from the base of the skull through the vertex without intravenous contrast. COMPARISON:  None. FINDINGS: Brain: No subdural, epidural, or subarachnoid hemorrhage identified. There is encephalomalacia in the medial inferior left frontal lobe on series 2, image 79 and sagittal image 29 consistent with a previous infarct. No other infarcts are identified. No acute ischemia is noted. No mass effect or midline shift. Ventricles and sulci are normal. Cerebellum, brainstem, and basal cisterns are normal. Vascular: No hyperdense vessel or unexpected calcification. Skull: Normal. Negative for fracture or focal lesion. Sinuses/Orbits: No acute finding. Other: None. IMPRESSION: 1. No acute intracranial abnormalities identified. 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 Electronically Signed   By: Dorise Bullion III M.D   On: 02/16/2019 17:11   Ct Chest W Contrast  Result Date: 02/16/2019 CLINICAL DATA:  Confusion. Muscle weakness. Altered mental status. Pallor. Nausea and vomiting. EXAM: CT CHEST, ABDOMEN, AND PELVIS WITH CONTRAST TECHNIQUE: Multidetector CT imaging of the chest, abdomen and pelvis was performed following the standard protocol during bolus administration of intravenous contrast. CONTRAST:  120mL OMNIPAQUE IOHEXOL 300 MG/ML  SOLN COMPARISON:  Multiple exams, including CT pelvis 05/31/2018; CT chest from 04/26/2017; CT abdomen from 08/23/2015 FINDINGS: CT CHEST FINDINGS Cardiovascular: Unremarkable Mediastinum/Nodes: AP window lymph node, 1.0 cm in short axis on  image 22/2, formerly the same by my measurements. Right hilar lymph node 0.9 cm in short axis on image 26/2, stable. Left para-aortic lymph node 0.5 cm in short axis on image 42/2, stable. Lungs/Pleura: Paraseptal emphysema. Bilateral airway thickening. Small calcified granuloma in the right upper lobe on image 30/4. Musculoskeletal: Unremarkable CT ABDOMEN PELVIS FINDINGS Hepatobiliary: Old granulomatous disease. Gallbladder unremarkable. Mild focal steatosis along the falciform ligament. Pancreas: Unremarkable Spleen: Unremarkable Adrenals/Urinary Tract: 2 mm right kidney lower pole nonobstructive renal calculus on image 54/5. Adrenal glands normal. The kidneys appear otherwise normal. Left-sided urinary bladder at left distal ureter obscured by streak artifact from the patient's left hip implant. Stomach/Bowel: Wall thickening in the ascending colon. Normal appendix. There are few scattered air-fluid levels in nondilated small bowel loops. Vascular/Lymphatic: Mild iliac artery atherosclerotic calcification. No pathologic adenopathy in the abdomen. Reproductive: Unremarkable. Other: No supplemental non-categorized findings. Musculoskeletal: Hernia mesh along the lower pelvic anterior abdominal wall. Left total hip prosthesis. Transitional lumbosacral vertebra. IMPRESSION: 1. Wall thickening in the ascending colon suspicious for colitis. Normal appendix. 2. There are a few scattered air-fluid levels in nondilated  small bowel, strictly speaking, antritis is not excluded. No gas in the bowel wall or extraluminal gas. 3.  Emphysema (ICD10-J43.9). 4. Stable upper normal sized right hilar and AP window lymph nodes. 5. Airway thickening is present, suggesting bronchitis or reactive airways disease. 6. Nonobstructive right nephrolithiasis. Electronically Signed   By: Van Clines M.D.   On: 02/16/2019 17:21   Ct Abdomen Pelvis W Contrast  Result Date: 02/16/2019 CLINICAL DATA:  Confusion. Muscle weakness.  Altered mental status. Pallor. Nausea and vomiting. EXAM: CT CHEST, ABDOMEN, AND PELVIS WITH CONTRAST TECHNIQUE: Multidetector CT imaging of the chest, abdomen and pelvis was performed following the standard protocol during bolus administration of intravenous contrast. CONTRAST:  141mL OMNIPAQUE IOHEXOL 300 MG/ML  SOLN COMPARISON:  Multiple exams, including CT pelvis 05/31/2018; CT chest from 04/26/2017; CT abdomen from 08/23/2015 FINDINGS: CT CHEST FINDINGS Cardiovascular: Unremarkable Mediastinum/Nodes: AP window lymph node, 1.0 cm in short axis on image 22/2, formerly the same by my measurements. Right hilar lymph node 0.9 cm in short axis on image 26/2, stable. Left para-aortic lymph node 0.5 cm in short axis on image 42/2, stable. Lungs/Pleura: Paraseptal emphysema. Bilateral airway thickening. Small calcified granuloma in the right upper lobe on image 30/4. Musculoskeletal: Unremarkable CT ABDOMEN PELVIS FINDINGS Hepatobiliary: Old granulomatous disease. Gallbladder unremarkable. Mild focal steatosis along the falciform ligament. Pancreas: Unremarkable Spleen: Unremarkable Adrenals/Urinary Tract: 2 mm right kidney lower pole nonobstructive renal calculus on image 54/5. Adrenal glands normal. The kidneys appear otherwise normal. Left-sided urinary bladder at left distal ureter obscured by streak artifact from the patient's left hip implant. Stomach/Bowel: Wall thickening in the ascending colon. Normal appendix. There are few scattered air-fluid levels in nondilated small bowel loops. Vascular/Lymphatic: Mild iliac artery atherosclerotic calcification. No pathologic adenopathy in the abdomen. Reproductive: Unremarkable. Other: No supplemental non-categorized findings. Musculoskeletal: Hernia mesh along the lower pelvic anterior abdominal wall. Left total hip prosthesis. Transitional lumbosacral vertebra. IMPRESSION: 1. Wall thickening in the ascending colon suspicious for colitis. Normal appendix. 2. There are a  few scattered air-fluid levels in nondilated small bowel, strictly speaking, antritis is not excluded. No gas in the bowel wall or extraluminal gas. 3.  Emphysema (ICD10-J43.9). 4. Stable upper normal sized right hilar and AP window lymph nodes. 5. Airway thickening is present, suggesting bronchitis or reactive airways disease. 6. Nonobstructive right nephrolithiasis. Electronically Signed   By: Van Clines M.D.   On: 02/16/2019 17:21    Assessment and Plan:   Kevin Alexander is a 53 y.o. y/o male who comes in today for follow-up after being discharged from the hospital.  The patient's symptoms have completely resolved.  I have been asked to follow with the patient for repeat EGD by infectious disease for testing for possible Whipple's disease.  The patient's antibodies have been negative for celiac sprue.  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  The patient has been set up for an EGD with small bowel biopsies.  The patient has been explained the plan and agrees with it.    Lucilla Lame, MD. [facility]   Note: This dictation was prepared with Dragon dictation along with smaller phrase technology. Any transcriptional errors that result from this process are unintentional.

## 2019-03-05 DIAGNOSIS — R000 Tachycardia, unspecified: Secondary | ICD-10-CM | POA: Insufficient documentation

## 2019-03-07 ENCOUNTER — Other Ambulatory Visit
Admission: RE | Admit: 2019-03-07 | Discharge: 2019-03-07 | Disposition: A | Discharge status: Home / Self Care | Payer: Medicare Other | Source: Ambulatory Visit | Attending: Gastroenterology | Admitting: Gastroenterology

## 2019-03-07 ENCOUNTER — Other Ambulatory Visit: Payer: Self-pay

## 2019-03-07 DIAGNOSIS — Z01812 Encounter for preprocedural laboratory examination: Secondary | ICD-10-CM | POA: Diagnosis present

## 2019-03-07 DIAGNOSIS — Z20828 Contact with and (suspected) exposure to other viral communicable diseases: Secondary | ICD-10-CM | POA: Diagnosis not present

## 2019-03-07 LAB — SARS CORONAVIRUS 2 (TAT 6-24 HRS): SARS Coronavirus 2: NEGATIVE

## 2019-03-10 ENCOUNTER — Inpatient Hospital Stay: Payer: Medicare Other

## 2019-03-10 ENCOUNTER — Encounter: Payer: Self-pay | Admitting: Oncology

## 2019-03-10 ENCOUNTER — Other Ambulatory Visit: Payer: Self-pay

## 2019-03-10 ENCOUNTER — Inpatient Hospital Stay: Payer: Medicare Other | Attending: Oncology | Admitting: Oncology

## 2019-03-10 VITALS — BP 117/79 | HR 85 | Temp 98.7°F | Ht 67.0 in | Wt 125.0 lb

## 2019-03-10 DIAGNOSIS — I358 Other nonrheumatic aortic valve disorders: Secondary | ICD-10-CM | POA: Insufficient documentation

## 2019-03-10 DIAGNOSIS — J449 Chronic obstructive pulmonary disease, unspecified: Secondary | ICD-10-CM | POA: Diagnosis not present

## 2019-03-10 DIAGNOSIS — R531 Weakness: Secondary | ICD-10-CM | POA: Insufficient documentation

## 2019-03-10 DIAGNOSIS — D509 Iron deficiency anemia, unspecified: Secondary | ICD-10-CM

## 2019-03-10 DIAGNOSIS — K219 Gastro-esophageal reflux disease without esophagitis: Secondary | ICD-10-CM | POA: Insufficient documentation

## 2019-03-10 DIAGNOSIS — M1990 Unspecified osteoarthritis, unspecified site: Secondary | ICD-10-CM | POA: Insufficient documentation

## 2019-03-10 DIAGNOSIS — D72829 Elevated white blood cell count, unspecified: Secondary | ICD-10-CM | POA: Insufficient documentation

## 2019-03-10 DIAGNOSIS — R110 Nausea: Secondary | ICD-10-CM | POA: Diagnosis not present

## 2019-03-10 DIAGNOSIS — F329 Major depressive disorder, single episode, unspecified: Secondary | ICD-10-CM | POA: Diagnosis not present

## 2019-03-10 DIAGNOSIS — Z7982 Long term (current) use of aspirin: Secondary | ICD-10-CM | POA: Insufficient documentation

## 2019-03-10 DIAGNOSIS — R630 Anorexia: Secondary | ICD-10-CM | POA: Insufficient documentation

## 2019-03-10 DIAGNOSIS — R634 Abnormal weight loss: Secondary | ICD-10-CM | POA: Insufficient documentation

## 2019-03-10 DIAGNOSIS — Z79899 Other long term (current) drug therapy: Secondary | ICD-10-CM | POA: Insufficient documentation

## 2019-03-10 DIAGNOSIS — K279 Peptic ulcer, site unspecified, unspecified as acute or chronic, without hemorrhage or perforation: Secondary | ICD-10-CM | POA: Insufficient documentation

## 2019-03-10 DIAGNOSIS — F17210 Nicotine dependence, cigarettes, uncomplicated: Secondary | ICD-10-CM | POA: Insufficient documentation

## 2019-03-10 DIAGNOSIS — I252 Old myocardial infarction: Secondary | ICD-10-CM | POA: Diagnosis not present

## 2019-03-10 LAB — VITAMIN B12: Vitamin B-12: 516 pg/mL (ref 180–914)

## 2019-03-10 LAB — CBC
HCT: 34.6 % — ABNORMAL LOW (ref 39.0–52.0)
Hemoglobin: 10.8 g/dL — ABNORMAL LOW (ref 13.0–17.0)
MCH: 30.3 pg (ref 26.0–34.0)
MCHC: 31.2 g/dL (ref 30.0–36.0)
MCV: 96.9 fL (ref 80.0–100.0)
Platelets: 383 10*3/uL (ref 150–400)
RBC: 3.57 MIL/uL — ABNORMAL LOW (ref 4.22–5.81)
RDW: 14.9 % (ref 11.5–15.5)
WBC: 14.7 10*3/uL — ABNORMAL HIGH (ref 4.0–10.5)
nRBC: 0 % (ref 0.0–0.2)

## 2019-03-10 LAB — FERRITIN: Ferritin: 419 ng/mL — ABNORMAL HIGH (ref 24–336)

## 2019-03-10 LAB — RETICULOCYTES
Immature Retic Fract: 14.7 % (ref 2.3–15.9)
RBC.: 3.57 MIL/uL — ABNORMAL LOW (ref 4.22–5.81)
Retic Count, Absolute: 103.5 10*3/uL (ref 19.0–186.0)
Retic Ct Pct: 2.9 % (ref 0.4–3.1)

## 2019-03-10 LAB — IRON AND TIBC
Iron: 67 ug/dL (ref 45–182)
Saturation Ratios: 54 % — ABNORMAL HIGH (ref 17.9–39.5)
TIBC: 124 ug/dL — ABNORMAL LOW (ref 250–450)
UIBC: 58 ug/dL

## 2019-03-10 LAB — FOLATE: Folate: 19.6 ng/mL (ref >5.9)

## 2019-03-10 NOTE — Progress Notes (Signed)
Patient stated that he had been at the hospital. Patient stated that he had lost a lot of weight in the past two years. Patient denied any blood loss. Patient is scheduled to have an EGD tomorrow.

## 2019-03-11 ENCOUNTER — Encounter: Payer: Self-pay | Admitting: *Deleted

## 2019-03-11 ENCOUNTER — Ambulatory Visit
Admission: RE | Admit: 2019-03-11 | Discharge: 2019-03-11 | Disposition: A | Discharge status: Home / Self Care | Payer: Medicare Other | Attending: Gastroenterology | Admitting: Gastroenterology

## 2019-03-11 ENCOUNTER — Ambulatory Visit: Payer: Medicare Other | Admitting: Anesthesiology

## 2019-03-11 ENCOUNTER — Encounter
Admission: RE | Disposition: A | Discharge status: Home / Self Care | Payer: Self-pay | Source: Home / Self Care | Attending: Gastroenterology

## 2019-03-11 DIAGNOSIS — R112 Nausea with vomiting, unspecified: Secondary | ICD-10-CM | POA: Insufficient documentation

## 2019-03-11 DIAGNOSIS — K229 Disease of esophagus, unspecified: Secondary | ICD-10-CM | POA: Diagnosis not present

## 2019-03-11 DIAGNOSIS — F17210 Nicotine dependence, cigarettes, uncomplicated: Secondary | ICD-10-CM | POA: Insufficient documentation

## 2019-03-11 DIAGNOSIS — R634 Abnormal weight loss: Secondary | ICD-10-CM | POA: Diagnosis present

## 2019-03-11 DIAGNOSIS — Z96642 Presence of left artificial hip joint: Secondary | ICD-10-CM | POA: Diagnosis not present

## 2019-03-11 DIAGNOSIS — F329 Major depressive disorder, single episode, unspecified: Secondary | ICD-10-CM | POA: Diagnosis not present

## 2019-03-11 DIAGNOSIS — Z79899 Other long term (current) drug therapy: Secondary | ICD-10-CM | POA: Insufficient documentation

## 2019-03-11 DIAGNOSIS — I252 Old myocardial infarction: Secondary | ICD-10-CM | POA: Insufficient documentation

## 2019-03-11 DIAGNOSIS — Z7982 Long term (current) use of aspirin: Secondary | ICD-10-CM | POA: Insufficient documentation

## 2019-03-11 DIAGNOSIS — B3781 Candidal esophagitis: Secondary | ICD-10-CM | POA: Insufficient documentation

## 2019-03-11 DIAGNOSIS — Z681 Body mass index (BMI) 19 or less, adult: Secondary | ICD-10-CM | POA: Insufficient documentation

## 2019-03-11 DIAGNOSIS — N200 Calculus of kidney: Secondary | ICD-10-CM | POA: Insufficient documentation

## 2019-03-11 DIAGNOSIS — G9389 Other specified disorders of brain: Secondary | ICD-10-CM | POA: Insufficient documentation

## 2019-03-11 DIAGNOSIS — J439 Emphysema, unspecified: Secondary | ICD-10-CM | POA: Diagnosis not present

## 2019-03-11 HISTORY — PX: ESOPHAGOGASTRODUODENOSCOPY (EGD) WITH PROPOFOL: SHX5813

## 2019-03-11 LAB — KOH PREP

## 2019-03-11 SURGERY — ESOPHAGOGASTRODUODENOSCOPY (EGD) WITH PROPOFOL
Anesthesia: General

## 2019-03-11 MED ORDER — LIDOCAINE HCL (PF) 2 % IJ SOLN
INTRAMUSCULAR | Status: DC | PRN
  Administered 2019-03-11: 100 mg via INTRADERMAL

## 2019-03-11 MED ORDER — SODIUM CHLORIDE 0.9 % IV SOLN
INTRAVENOUS | Status: DC
  Administered 2019-03-11: 10:00:00 1000 mL via INTRAVENOUS

## 2019-03-11 MED ORDER — PROPOFOL 10 MG/ML IV BOLUS
INTRAVENOUS | Status: DC | PRN
  Administered 2019-03-11: 50 mg via INTRAVENOUS

## 2019-03-11 MED ORDER — PROPOFOL 500 MG/50ML IV EMUL
INTRAVENOUS | Status: DC | PRN
  Administered 2019-03-11: 150 ug/kg/min via INTRAVENOUS

## 2019-03-11 NOTE — Transfer of Care (Signed)
Immediate Anesthesia Transfer of Care Note  Patient: Kevin Alexander  Procedure(s) Performed: ESOPHAGOGASTRODUODENOSCOPY (EGD) WITH PROPOFOL (N/A )  Patient Location: PACU  Anesthesia Type:General  Level of Consciousness: awake, alert  and oriented  Airway & Oxygen Therapy: Patient Spontanous Breathing and Patient connected to nasal cannula oxygen  Post-op Assessment: Report given to RN and Post -op Vital signs reviewed and stable  Post vital signs: Reviewed and stable  Last Vitals:  Vitals Value Taken Time  BP 119/81 03/11/19 1104  Temp 36.8 C 03/11/19 1104  Pulse 78 03/11/19 1104  Resp 12 03/11/19 1104  SpO2 100 % 03/11/19 1104  Vitals shown include unvalidated device data.  Last Pain:  Vitals:   03/11/19 1104  TempSrc:   PainSc: 0-No pain         Complications: No apparent anesthesia complications

## 2019-03-11 NOTE — Anesthesia Postprocedure Evaluation (Signed)
Anesthesia Post Note  Patient: Kevin Alexander  Procedure(s) Performed: ESOPHAGOGASTRODUODENOSCOPY (EGD) WITH PROPOFOL (N/A )  Patient location during evaluation: Endoscopy Anesthesia Type: General Level of consciousness: awake and alert and oriented Pain management: pain level controlled Vital Signs Assessment: post-procedure vital signs reviewed and stable Respiratory status: spontaneous breathing, nonlabored ventilation and respiratory function stable Cardiovascular status: blood pressure returned to baseline and stable Postop Assessment: no signs of nausea or vomiting Anesthetic complications: no     Last Vitals:  Vitals:   03/11/19 1114 03/11/19 1124  BP: (!) 122/91 134/80  Pulse: 73 73  Resp: 16 17  Temp:    SpO2: 100% 100%    Last Pain:  Vitals:   03/11/19 1104  TempSrc:   PainSc: 0-No pain                 Ireta Pullman
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
## 2019-03-11 NOTE — Anesthesia Procedure Notes (Signed)
Date/Time: 03/11/2019 10:41 AM Performed by: Nelda Marseille, CRNA Pre-anesthesia Checklist: Patient identified, Emergency Drugs available, Suction available, Patient being monitored and Timeout performed Oxygen Delivery Method: Nasal cannula

## 2019-03-11 NOTE — Anesthesia Post-op Follow-up Note (Signed)
Anesthesia QCDR form completed.        
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
## 2019-03-11 NOTE — Op Note (Signed)
[facility] Gastroenterology Patient Name: Kevin Alexander Procedure Date: 03/11/2019 10:37 AM MRN: [id number] Account #: [id number] Date of Birth: 1965-10-16 Admit Type: Outpatient Age: 53 Room: [facility] ENDO ROOM 4 Gender: Male Note Status: Finalized Procedure:            Upper GI endoscopy Indications:          Nausea with vomiting, Weight loss Providers:            Lucilla Lame MD, MD Referring MD:         Juluis Rainier (Referring MD) Medicines:            Propofol per Anesthesia Complications:        No immediate complications. Procedure:            Pre-Anesthesia Assessment:                       - Prior to the procedure, a History and Physical was                        performed, and patient medications and allergies were                        reviewed. The patient's tolerance of previous                        anesthesia was also reviewed. The risks and benefits of                        the procedure and the sedation options and risks were                        discussed with the patient. All questions were                        answered, and informed consent was obtained. Prior                        Anticoagulants: The patient has taken no previous                        anticoagulant or antiplatelet agents. ASA Grade                        Assessment: II - A patient with mild systemic disease.                        After reviewing the risks and benefits, the patient was                        deemed in satisfactory condition to undergo the                        procedure.                       After obtaining informed consent, the endoscope was                        passed under direct vision. Throughout the procedure,  the patient's blood pressure, pulse, and oxygen                        saturations were monitored continuously. The Endoscope                        was introduced through the mouth, and advanced to the                  second part of duodenum. The upper GI endoscopy was                        accomplished without difficulty. The patient tolerated                        the procedure well. Findings:      Diffuse, white plaques were found in the entire esophagus. Brushings for       KOH prep were obtained.      The entire examined stomach was normal.      The examined duodenum was normal. Biopsies were taken with a cold       forceps for histology. Impression:           - Esophageal plaques were found, consistent with                        candidiasis. Brushings performed.                       - Normal stomach.                       - Normal examined duodenum. Biopsied. Recommendation:       - Await pathology results.                       - Discharge patient to home.                       - Resume previous diet.                       - Continue present medications.                       - Await pathology results.                       - Path for Whipple, celiac and amyloidosis. Procedure Code(s):    --- Professional ---                       [phone], Esophagogastroduodenoscopy, flexible, transoral;                        with biopsy, single or multiple Diagnosis Code(s):    --- Professional ---                       R63.4, Abnormal weight loss                       R11.2, Nausea with vomiting, unspecified                       K22.9, Disease  of esophagus, unspecified CPT copyright 2019 American Medical Association. All rights reserved. The codes documented in this report are preliminary and upon coder review may  be revised to meet current compliance requirements. Lucilla Lame MD, MD 03/11/2019 10:54:00 AM This report has been signed electronically. Number of Addenda: 0 Note Initiated On: 03/11/2019 10:37 AM Estimated Blood Loss: Estimated blood loss: none.      [facility]

## 2019-03-11 NOTE — Interval H&P Note (Signed)
History and Physical Interval Note:  03/11/2019 10:25 AM  Kevin Alexander  has presented today for surgery, with the diagnosis of Nausea and vomiting R11.2 Loss of weight R63.4.  The various methods of treatment have been discussed with the patient and family. After consideration of risks, benefits and other options for treatment, the patient has consented to  Procedure(s): ESOPHAGOGASTRODUODENOSCOPY (EGD) WITH PROPOFOL (N/A) as a surgical intervention.  The patient's history has been reviewed, patient examined, no change in status, stable for surgery.  I have reviewed the patient's chart and labs.  Questions were answered to the patient's satisfaction.     Geraldene Eisel [employer]

## 2019-03-12 ENCOUNTER — Other Ambulatory Visit: Payer: Self-pay

## 2019-03-12 ENCOUNTER — Telehealth: Payer: Self-pay

## 2019-03-12 ENCOUNTER — Encounter: Payer: Self-pay | Admitting: Gastroenterology

## 2019-03-12 MED ORDER — FLUCONAZOLE 100 MG PO TABS: 100.0000 mg | ORAL_TABLET | Freq: Every day | ORAL | 0 refills | Status: DC

## 2019-03-12 NOTE — Telephone Encounter (Signed)
-----   Message from Lucilla Lame, MD sent at 03/11/2019  6:17 PM EDT ----- Please start this patient on Diflucan.

## 2019-03-12 NOTE — Telephone Encounter (Signed)
Will let him know when all the pathology is back.

## 2019-03-12 NOTE — Telephone Encounter (Signed)
Pt notified. Rx has been sent to his pharmacy.   Dr Allen Norris, pt would like to know when and how often he should be follow up with you.

## 2019-03-13 ENCOUNTER — Other Ambulatory Visit: Payer: Self-pay | Admitting: Student in an Organized Health Care Education/Training Program

## 2019-03-13 LAB — SURGICAL PATHOLOGY

## 2019-03-17 ENCOUNTER — Telehealth: Payer: Self-pay | Admitting: Gastroenterology

## 2019-03-17 NOTE — Telephone Encounter (Signed)
Pt is calling for Biopsy results from his procedure

## 2019-03-18 ENCOUNTER — Telehealth: Payer: Self-pay

## 2019-03-18 MED ORDER — TIZANIDINE HCL 4 MG PO TABS: 4.0000 mg | ORAL_TABLET | Freq: Three times a day (TID) | ORAL | 5 refills | Status: AC | PRN

## 2019-03-18 NOTE — Telephone Encounter (Signed)
Will you do this? 

## 2019-03-18 NOTE — Telephone Encounter (Signed)
Requested Prescriptions   Signed Prescriptions Disp Refills  . tiZANidine (ZANAFLEX) 4 MG tablet 90 tablet 5    Sig: Take 1 tablet (4 mg total) by mouth every 8 (eight) hours as needed for muscle spasms.    Authorizing Provider: Gillis Santa

## 2019-03-18 NOTE — Telephone Encounter (Signed)
Patient notified script sent to pharmacy. Instructions on how to take it given.

## 2019-03-18 NOTE — Telephone Encounter (Signed)
LVM for pt to return my call.

## 2019-03-18 NOTE — Telephone Encounter (Signed)
Pt called and is requesting Rx For Tizanidine, he received Rx for Lyrica but he needs Muscle relaxer also.

## 2019-03-18 NOTE — Telephone Encounter (Signed)
-----   Message from Lucilla Lame, MD sent at 03/13/2019  5:57 PM EDT ----- Let the patient know that the biopsies of his small intestines did not show any of the findings the infectious disease doctor was looking for.  The biopsies showed normal tissue without any sign of infection or inflammation.

## 2019-03-19 NOTE — Telephone Encounter (Signed)
Pt notified of results

## 2019-03-24 ENCOUNTER — Encounter: Payer: Self-pay | Admitting: Student in an Organized Health Care Education/Training Program

## 2019-03-24 NOTE — Progress Notes (Signed)
Patient stopped Lyrica for approx 10 days but he does want to start that back.  He will need refill.  Hospitalization approx 5 days d/t ? Intestinal parasite.  approx 6 weeks ago.

## 2019-03-25 ENCOUNTER — Encounter: Payer: Self-pay | Admitting: Student in an Organized Health Care Education/Training Program

## 2019-03-25 ENCOUNTER — Ambulatory Visit
Payer: Medicare Other | Attending: Student in an Organized Health Care Education/Training Program | Admitting: Student in an Organized Health Care Education/Training Program

## 2019-03-25 ENCOUNTER — Other Ambulatory Visit: Payer: Self-pay

## 2019-03-25 DIAGNOSIS — Z969 Presence of functional implant, unspecified: Secondary | ICD-10-CM | POA: Diagnosis not present

## 2019-03-25 DIAGNOSIS — G629 Polyneuropathy, unspecified: Secondary | ICD-10-CM

## 2019-03-25 DIAGNOSIS — G608 Other hereditary and idiopathic neuropathies: Secondary | ICD-10-CM

## 2019-03-25 DIAGNOSIS — G894 Chronic pain syndrome: Secondary | ICD-10-CM | POA: Diagnosis not present

## 2019-03-25 DIAGNOSIS — M1610 Unilateral primary osteoarthritis, unspecified hip: Secondary | ICD-10-CM

## 2019-03-25 MED ORDER — PREGABALIN 100 MG PO CAPS: 100.0000 mg | ORAL_CAPSULE | Freq: Three times a day (TID) | ORAL | 5 refills | Status: DC
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
## 2019-04-10 ENCOUNTER — Inpatient Hospital Stay: Payer: Medicare Other | Admitting: Oncology

## 2019-04-10 ENCOUNTER — Other Ambulatory Visit: Payer: Self-pay

## 2019-04-10 ENCOUNTER — Inpatient Hospital Stay: Payer: Medicare Other

## 2019-04-10 ENCOUNTER — Telehealth: Payer: Self-pay | Admitting: Gastroenterology

## 2019-04-10 DIAGNOSIS — D509 Iron deficiency anemia, unspecified: Secondary | ICD-10-CM

## 2019-04-10 NOTE — Telephone Encounter (Signed)
Patient called & states he has finished medication for his thrush in his throat. What is the next step?

## 2019-04-14 NOTE — Telephone Encounter (Signed)
The next step would be to continue eating and see how his symptoms go and if he gains weight.

## 2019-04-15 NOTE — Telephone Encounter (Signed)
Left vm with Dr. Dorothey Baseman response to his question.

## 2019-04-18 NOTE — Progress Notes (Signed)
Called patient no answer left message  
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
## 2019-04-21 ENCOUNTER — Inpatient Hospital Stay: Payer: Medicare Other | Attending: Oncology

## 2019-04-21 ENCOUNTER — Encounter: Payer: Self-pay | Admitting: Oncology

## 2019-04-21 ENCOUNTER — Inpatient Hospital Stay (HOSPITAL_BASED_OUTPATIENT_CLINIC_OR_DEPARTMENT_OTHER): Payer: Medicare Other | Admitting: Oncology

## 2019-04-21 ENCOUNTER — Other Ambulatory Visit: Payer: Self-pay

## 2019-04-21 VITALS — BP 140/80 | HR 64 | Temp 98.5°F | Resp 16 | Wt 148.5 lb

## 2019-04-21 DIAGNOSIS — D72829 Elevated white blood cell count, unspecified: Secondary | ICD-10-CM | POA: Diagnosis not present

## 2019-04-21 DIAGNOSIS — J449 Chronic obstructive pulmonary disease, unspecified: Secondary | ICD-10-CM | POA: Insufficient documentation

## 2019-04-21 DIAGNOSIS — I252 Old myocardial infarction: Secondary | ICD-10-CM | POA: Diagnosis not present

## 2019-04-21 DIAGNOSIS — Z79899 Other long term (current) drug therapy: Secondary | ICD-10-CM | POA: Diagnosis not present

## 2019-04-21 DIAGNOSIS — I358 Other nonrheumatic aortic valve disorders: Secondary | ICD-10-CM | POA: Diagnosis not present

## 2019-04-21 DIAGNOSIS — Z7982 Long term (current) use of aspirin: Secondary | ICD-10-CM | POA: Insufficient documentation

## 2019-04-21 DIAGNOSIS — D509 Iron deficiency anemia, unspecified: Secondary | ICD-10-CM | POA: Diagnosis not present

## 2019-04-21 DIAGNOSIS — M1990 Unspecified osteoarthritis, unspecified site: Secondary | ICD-10-CM | POA: Diagnosis not present

## 2019-04-21 DIAGNOSIS — K219 Gastro-esophageal reflux disease without esophagitis: Secondary | ICD-10-CM | POA: Diagnosis not present

## 2019-04-21 DIAGNOSIS — F17210 Nicotine dependence, cigarettes, uncomplicated: Secondary | ICD-10-CM | POA: Insufficient documentation

## 2019-04-21 DIAGNOSIS — R634 Abnormal weight loss: Secondary | ICD-10-CM | POA: Insufficient documentation

## 2019-04-21 DIAGNOSIS — I10 Essential (primary) hypertension: Secondary | ICD-10-CM | POA: Insufficient documentation

## 2019-04-21 DIAGNOSIS — F329 Major depressive disorder, single episode, unspecified: Secondary | ICD-10-CM | POA: Insufficient documentation

## 2019-04-21 LAB — CBC WITH DIFFERENTIAL/PLATELET
Abs Immature Granulocytes: 0.1 10*3/uL — ABNORMAL HIGH (ref 0.00–0.07)
Basophils Absolute: 0.1 10*3/uL (ref 0.0–0.1)
Basophils Relative: 1 %
Eosinophils Absolute: 0.6 10*3/uL — ABNORMAL HIGH (ref 0.0–0.5)
Eosinophils Relative: 5 %
HCT: 34.4 % — ABNORMAL LOW (ref 39.0–52.0)
Hemoglobin: 10.5 g/dL — ABNORMAL LOW (ref 13.0–17.0)
Immature Granulocytes: 1 %
Lymphocytes Relative: 23 %
Lymphs Abs: 3 10*3/uL (ref 0.7–4.0)
MCH: 30 pg (ref 26.0–34.0)
MCHC: 30.5 g/dL (ref 30.0–36.0)
MCV: 98.3 fL (ref 80.0–100.0)
Monocytes Absolute: 1 10*3/uL (ref 0.1–1.0)
Monocytes Relative: 8 %
Neutro Abs: 8 10*3/uL — ABNORMAL HIGH (ref 1.7–7.7)
Neutrophils Relative %: 62 %
Platelets: 268 10*3/uL (ref 150–400)
RBC: 3.5 MIL/uL — ABNORMAL LOW (ref 4.22–5.81)
RDW: 14.6 % (ref 11.5–15.5)
WBC: 12.7 10*3/uL — ABNORMAL HIGH (ref 4.0–10.5)
nRBC: 0 % (ref 0.0–0.2)

## 2019-04-21 LAB — FERRITIN: Ferritin: 340 ng/mL — ABNORMAL HIGH (ref 24–336)

## 2019-04-21 LAB — IRON AND TIBC
Iron: 55 ug/dL (ref 45–182)
Saturation Ratios: 23 % (ref 17.9–39.5)
TIBC: 239 ug/dL — ABNORMAL LOW (ref 250–450)
UIBC: 184 ug/dL

## 2019-04-21 NOTE — Progress Notes (Signed)
Pt has no complaints or concerns for today.

## 2019-05-26 DIAGNOSIS — R768 Other specified abnormal immunological findings in serum: Secondary | ICD-10-CM | POA: Insufficient documentation

## 2019-06-09 DIAGNOSIS — M159 Polyosteoarthritis, unspecified: Secondary | ICD-10-CM | POA: Insufficient documentation

## 2019-06-23 ENCOUNTER — Other Ambulatory Visit: Payer: Self-pay

## 2019-06-23 DIAGNOSIS — D509 Iron deficiency anemia, unspecified: Secondary | ICD-10-CM
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
## 2019-07-25 ENCOUNTER — Telehealth: Payer: Self-pay | Admitting: Oncology

## 2019-07-25 ENCOUNTER — Inpatient Hospital Stay: Payer: Medicare Other | Attending: Oncology

## 2019-07-25 ENCOUNTER — Encounter: Payer: Self-pay | Admitting: Oncology

## 2019-07-25 ENCOUNTER — Inpatient Hospital Stay: Payer: Medicare Other | Admitting: Oncology

## 2019-07-25 NOTE — Telephone Encounter (Signed)
Called pt to r\s missed apt on 07/25/2019. Unable to leave a message. Will mail pt letter to contact office to r\s.

## 2019-08-20 ENCOUNTER — Other Ambulatory Visit: Payer: Self-pay | Admitting: Student in an Organized Health Care Education/Training Program

## 2019-08-26 ENCOUNTER — Telehealth: Payer: Self-pay | Admitting: Student in an Organized Health Care Education/Training Program

## 2019-08-26 NOTE — Telephone Encounter (Signed)
Pt called and stated that he is out of Tizanidine and needs a refill. His medication says he should have enough to last until 3/28.

## 2019-08-26 NOTE — Telephone Encounter (Signed)
Patient has not had an appointment since September, move his appointment up to tomorrow if available and cancel his April appointment.

## 2019-09-09 ENCOUNTER — Other Ambulatory Visit: Payer: Self-pay | Admitting: Neurology

## 2019-09-09 DIAGNOSIS — R202 Paresthesia of skin: Secondary | ICD-10-CM

## 2019-09-09 DIAGNOSIS — R200 Anesthesia of skin: Secondary | ICD-10-CM

## 2019-09-21 ENCOUNTER — Ambulatory Visit: Payer: Medicare Other

## 2019-09-23 ENCOUNTER — Encounter: Payer: Self-pay | Admitting: Student in an Organized Health Care Education/Training Program

## 2019-09-23 ENCOUNTER — Other Ambulatory Visit: Payer: Self-pay

## 2019-09-23 ENCOUNTER — Ambulatory Visit
Payer: Medicare Other | Attending: Student in an Organized Health Care Education/Training Program | Admitting: Student in an Organized Health Care Education/Training Program

## 2019-09-23 DIAGNOSIS — Z969 Presence of functional implant, unspecified: Secondary | ICD-10-CM

## 2019-09-23 DIAGNOSIS — G608 Other hereditary and idiopathic neuropathies: Secondary | ICD-10-CM

## 2019-09-23 DIAGNOSIS — G894 Chronic pain syndrome: Secondary | ICD-10-CM

## 2019-09-23 DIAGNOSIS — G629 Polyneuropathy, unspecified: Secondary | ICD-10-CM

## 2019-09-23 DIAGNOSIS — R202 Paresthesia of skin: Secondary | ICD-10-CM | POA: Diagnosis present

## 2019-09-23 DIAGNOSIS — R200 Anesthesia of skin: Secondary | ICD-10-CM

## 2019-09-23 MED ORDER — PREGABALIN 150 MG PO CAPS: 150.0000 mg | ORAL_CAPSULE | Freq: Three times a day (TID) | ORAL | 2 refills | Status: DC

## 2019-09-23 MED ORDER — TIZANIDINE HCL 4 MG PO TABS: 4.0000 mg | ORAL_TABLET | Freq: Three times a day (TID) | ORAL | 2 refills | Status: DC
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
## 2019-09-24 ENCOUNTER — Telehealth: Payer: Self-pay | Admitting: Student in an Organized Health Care Education/Training Program

## 2019-09-24 ENCOUNTER — Ambulatory Visit
Admission: RE | Admit: 2019-09-24 | Discharge: 2019-09-24 | Disposition: A | Discharge status: Home / Self Care | Payer: Medicare Other | Source: Ambulatory Visit | Attending: Neurology | Admitting: Neurology

## 2019-09-24 ENCOUNTER — Other Ambulatory Visit: Payer: Self-pay

## 2019-09-24 DIAGNOSIS — R202 Paresthesia of skin: Secondary | ICD-10-CM | POA: Diagnosis present

## 2019-09-24 DIAGNOSIS — R200 Anesthesia of skin: Secondary | ICD-10-CM | POA: Diagnosis present

## 2019-09-24 NOTE — Telephone Encounter (Signed)
Patient is calling to let Dr. Holley Raring know when he is scheduled to have nerve conduction studies, April 20 at 4pm. He is having MRI today.

## 2019-09-30 ENCOUNTER — Telehealth: Payer: Self-pay | Admitting: *Deleted

## 2019-09-30 NOTE — Telephone Encounter (Signed)
He just had VV on 09-23-19. Do you wish to offer another VV or advice?  I do not know if he spoke with neuro about SCS.

## 2019-11-11 ENCOUNTER — Encounter: Payer: Medicare Other | Admitting: Student in an Organized Health Care Education/Training Program

## 2019-11-13 ENCOUNTER — Encounter: Payer: Medicare Other | Admitting: Student in an Organized Health Care Education/Training Program

## 2019-11-18 ENCOUNTER — Ambulatory Visit
Admission: RE | Admit: 2019-11-18 | Discharge: 2019-11-18 | Disposition: A | Discharge status: Home / Self Care | Payer: Medicare Other | Source: Ambulatory Visit | Attending: Student in an Organized Health Care Education/Training Program | Admitting: Student in an Organized Health Care Education/Training Program

## 2019-11-18 ENCOUNTER — Ambulatory Visit (HOSPITAL_BASED_OUTPATIENT_CLINIC_OR_DEPARTMENT_OTHER): Payer: Medicare Other | Admitting: Student in an Organized Health Care Education/Training Program

## 2019-11-18 ENCOUNTER — Other Ambulatory Visit: Payer: Self-pay

## 2019-11-18 ENCOUNTER — Encounter: Payer: Self-pay | Admitting: Student in an Organized Health Care Education/Training Program

## 2019-11-18 VITALS — BP 112/74 | HR 69 | Temp 97.5°F | Resp 16 | Ht 67.0 in | Wt 140.0 lb

## 2019-11-18 DIAGNOSIS — G608 Other hereditary and idiopathic neuropathies: Secondary | ICD-10-CM | POA: Insufficient documentation

## 2019-11-18 DIAGNOSIS — G894 Chronic pain syndrome: Secondary | ICD-10-CM | POA: Insufficient documentation

## 2019-11-18 DIAGNOSIS — G629 Polyneuropathy, unspecified: Secondary | ICD-10-CM | POA: Insufficient documentation

## 2019-11-18 DIAGNOSIS — M792 Neuralgia and neuritis, unspecified: Secondary | ICD-10-CM | POA: Insufficient documentation

## 2019-11-18 MED ORDER — TIZANIDINE HCL 4 MG PO TABS: 4.0000 mg | ORAL_TABLET | Freq: Three times a day (TID) | ORAL | 2 refills | Status: AC

## 2019-11-18 MED ORDER — PREGABALIN 150 MG PO CAPS: 150.0000 mg | ORAL_CAPSULE | Freq: Three times a day (TID) | ORAL | 2 refills | Status: DC

## 2019-11-18 NOTE — Patient Instructions (Signed)

## 2019-11-18 NOTE — Progress Notes (Signed)
Safety precautions to be maintained throughout the outpatient stay will include: orient to surroundings, keep bed in low position, maintain call bell within reach at all times, provide assistance with transfer out of bed and ambulation.  
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
## 2019-11-25 IMAGING — US US ABDOMEN LIMITED
1 series · 14 of 25 positions shown · non-contrast
Comparison: None.

CLINICAL DATA: Abdominal pain for 1 year

EXAM:
ULTRASOUND ABDOMEN LIMITED RIGHT UPPER QUADRANT

[Series 1: us abdomen limited · 0.20mm/px · 14 of 43 slices shown]
[im 1/43]
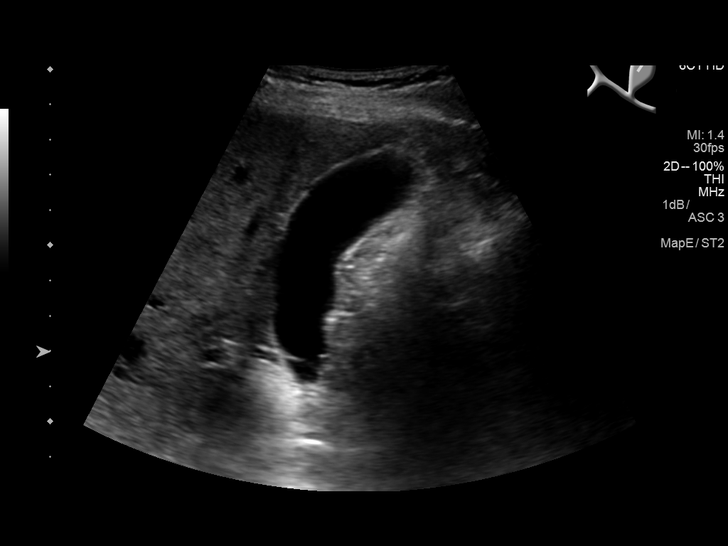
[im 4/43]
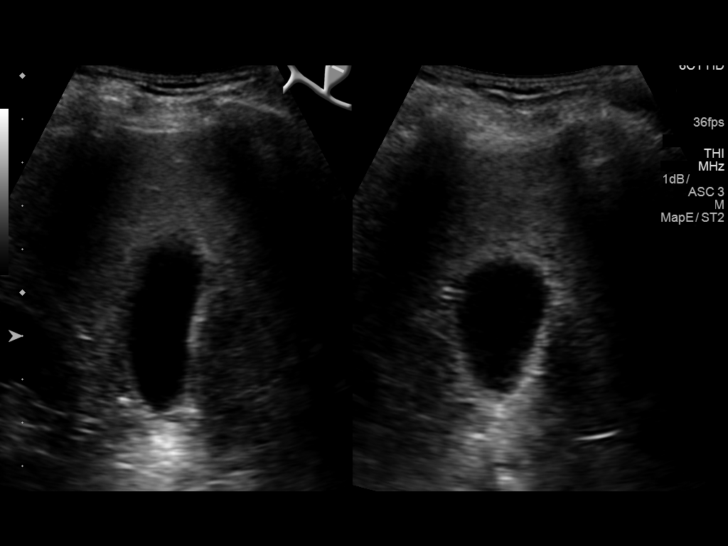
[im 8/43]
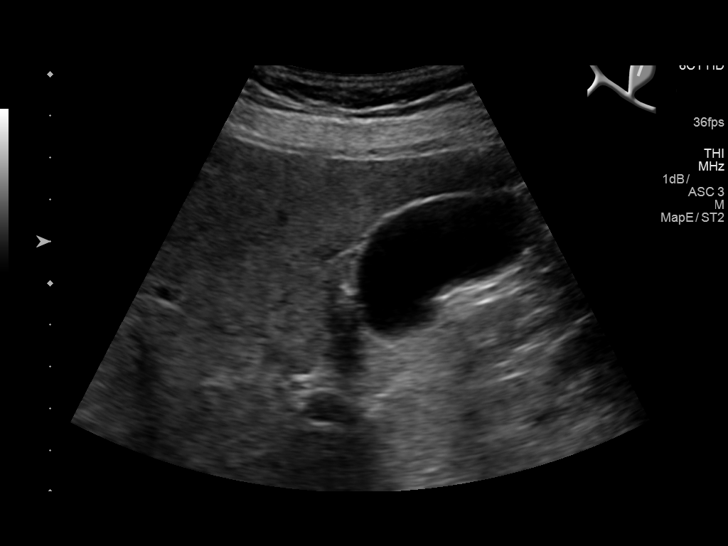
[im 11/43]
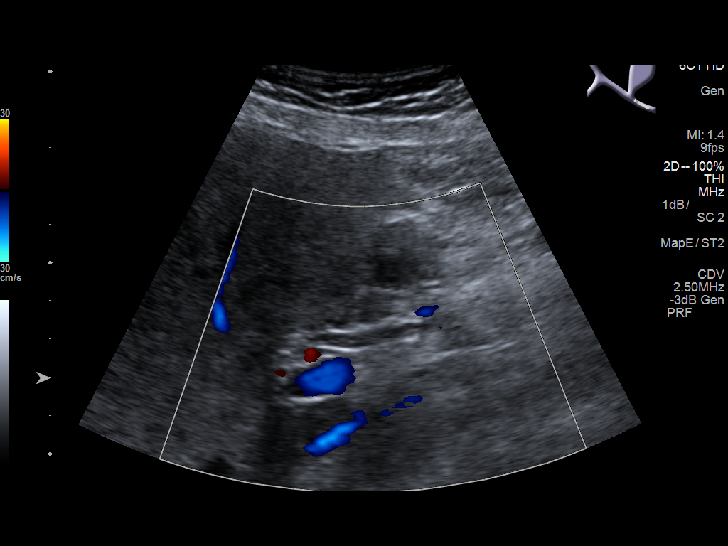
[im 15/43]
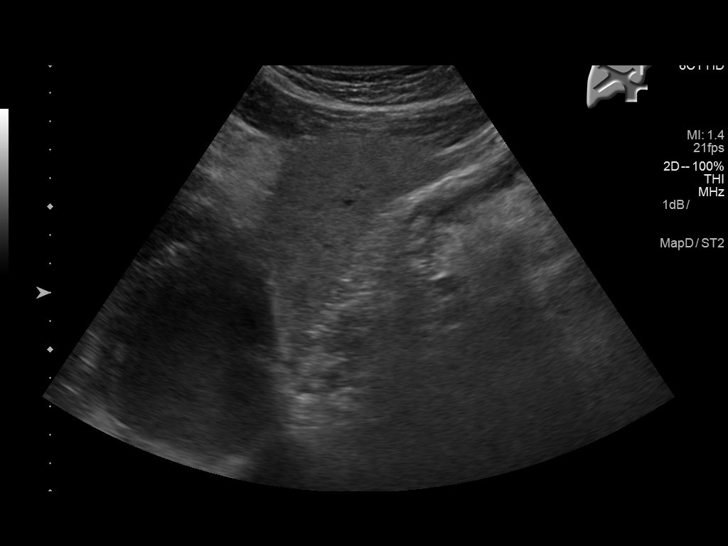
[im 16/43]
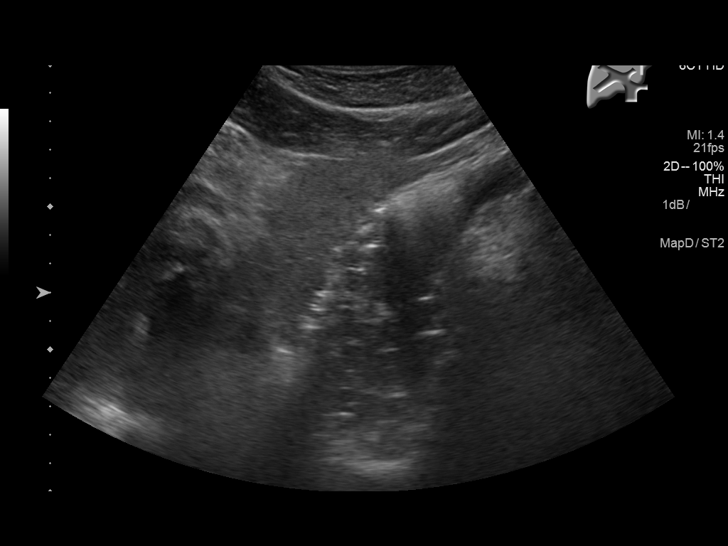
[im 20/43]
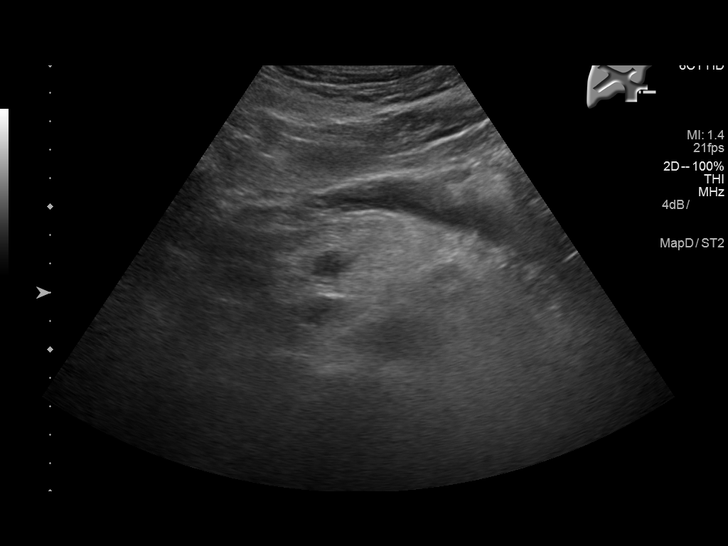
[im 23/43]
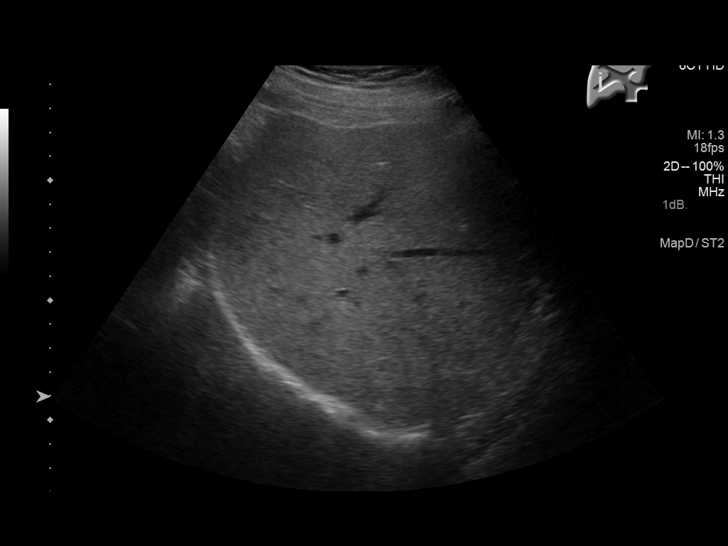
[im 27/43]
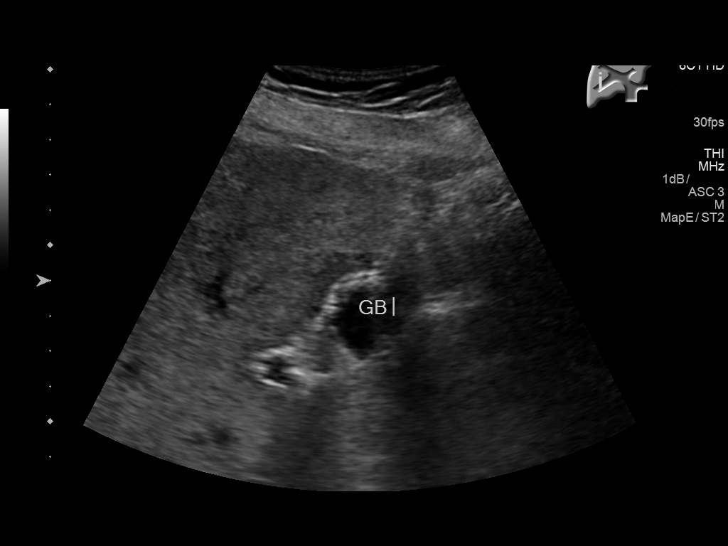
[im 29/43]
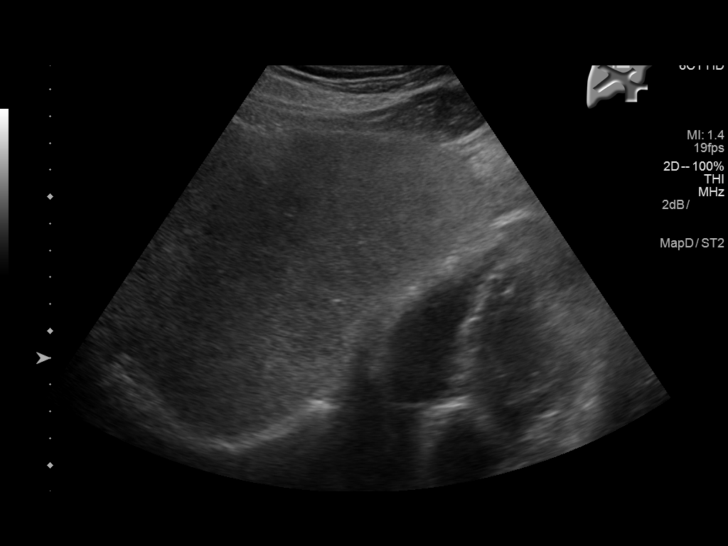
[im 32/43]
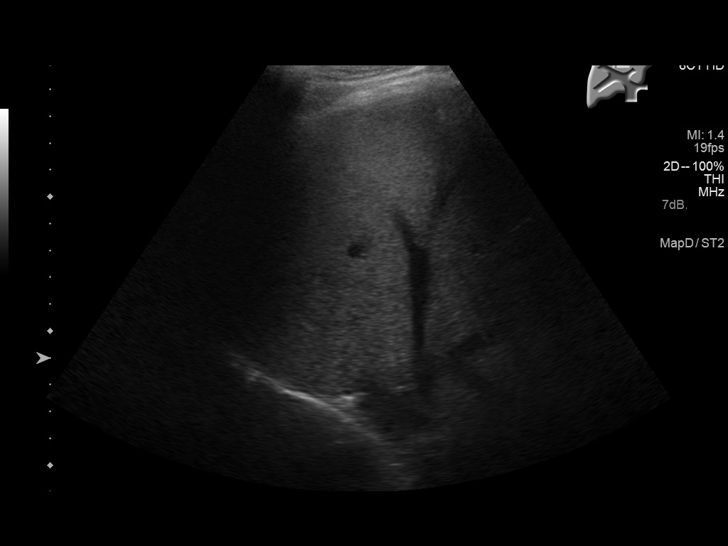
[im 36/43]
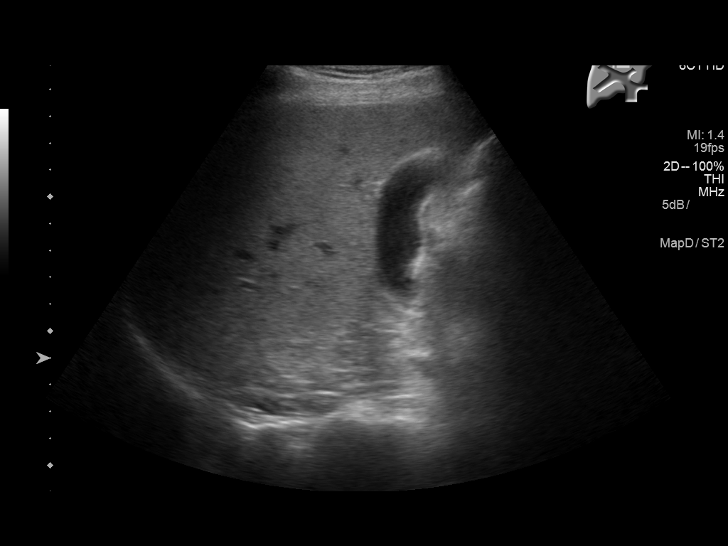
[im 39/43]
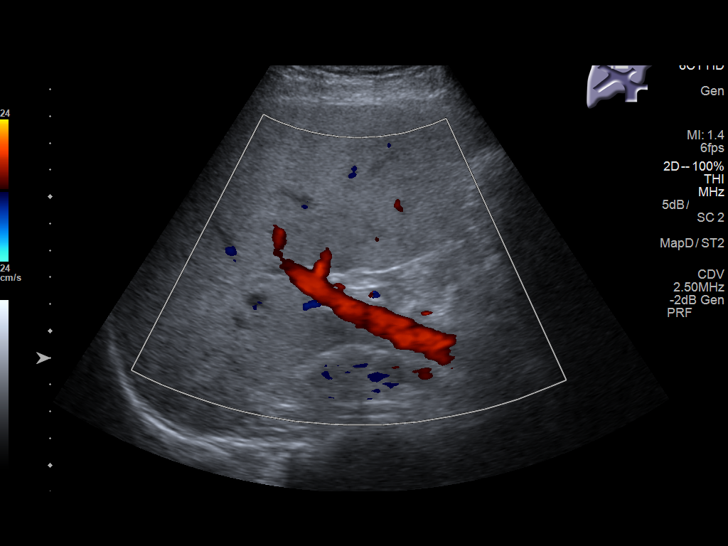
[im 43/43]
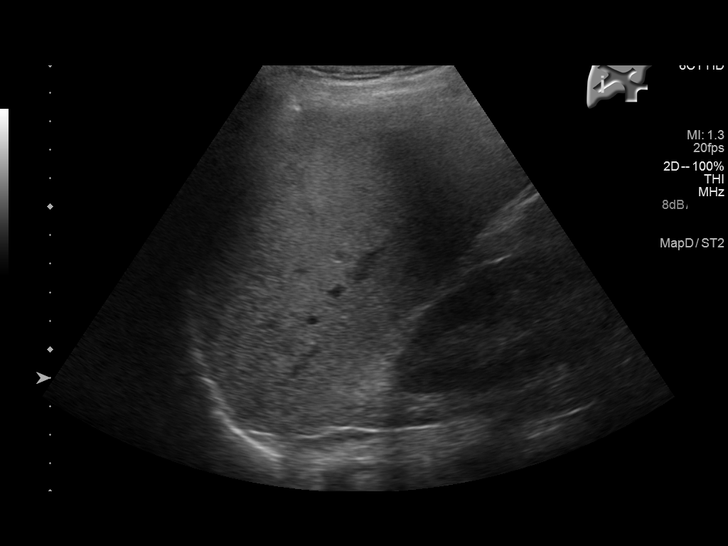

[14 of 25 positions shown; findings below may reference images not displayed]

FINDINGS: Gallbladder:

No gallstones or wall thickening visualized. No sonographic Murphy
sign noted by sonographer.

Common bile duct:

Diameter: 3.5 mm.

Liver:

Fatty infiltration is noted. Focal fatty sparing is noted adjacent
to the gallbladder fossa. No other focal abnormality is noted.
Portal vein is patent on color Doppler imaging with normal direction
of blood flow towards the liver.
IMPRESSION: Fatty liver.

No acute abnormality is noted.

## 2020-01-26 ENCOUNTER — Telehealth: Payer: Self-pay | Admitting: *Deleted

## 2020-01-26 NOTE — Telephone Encounter (Signed)
RN called and spoke with Kevin Alexander, instructed that MD stated pt should call PCP for acute symptoms or go to the emergency room.  RN instructed caregiver to call office once pt was asymptomatic and make an appt with Dr Grayland Ormond. Pt was last seen in November 2020. Caregiver verbalized understanding.

## 2020-01-26 NOTE — Telephone Encounter (Signed)
Marcie Bal , patient contact called stating that patient is not doing well and that he is having diarrhea, cannot keep anything down and is sleeping all the time lake he did last year. She is asking if we can see this patient. He has no follow up appointment with Dr Grayland Ormond at this time, he missed his appointment in January. Please advise

## 2020-02-10 ENCOUNTER — Telehealth: Payer: Self-pay

## 2020-02-10 NOTE — Telephone Encounter (Signed)
Pt left voice message stating his is not able to swallow, he can't eat, vomiting and having diarrhea. Pt would like an appt as soon as possible. Please advise if a telephone visit would be appropriate as you are overbooked this week and on call next week.

## 2020-02-10 NOTE — Telephone Encounter (Signed)
Please fit the patient in when you can to be seen. IF he is getting rapidly worse then go to the ER.

## 2020-02-11 NOTE — Telephone Encounter (Signed)
Contact pt and offered an appt for tomorrow, 02/12/20. Pt stated he didn't know if he could make if because he is so weak. Advised pt per Dr Allen Norris, if he is rapidly getting worse he needs to go to the ER to be evaluated. I have added pt to the schedule for tomorrow and advise him to contact me if he can make this appt,

## 2020-02-12 ENCOUNTER — Ambulatory Visit: Payer: Medicare Other | Admitting: Gastroenterology
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
## 2020-02-17 ENCOUNTER — Encounter: Payer: Medicare Other | Admitting: Student in an Organized Health Care Education/Training Program

## 2020-03-01 ENCOUNTER — Ambulatory Visit
Payer: Medicare Other | Attending: Student in an Organized Health Care Education/Training Program | Admitting: Student in an Organized Health Care Education/Training Program

## 2020-03-01 ENCOUNTER — Emergency Department
Admission: EM | Admit: 2020-03-01 | Discharge: 2020-03-01 | Disposition: A | Discharge status: Home / Self Care | Payer: Medicare Other | Attending: Emergency Medicine | Admitting: Emergency Medicine

## 2020-03-01 ENCOUNTER — Encounter: Payer: Self-pay | Admitting: Emergency Medicine

## 2020-03-01 ENCOUNTER — Other Ambulatory Visit: Payer: Self-pay

## 2020-03-01 DIAGNOSIS — R197 Diarrhea, unspecified: Secondary | ICD-10-CM | POA: Diagnosis not present

## 2020-03-01 DIAGNOSIS — R109 Unspecified abdominal pain: Secondary | ICD-10-CM | POA: Diagnosis present

## 2020-03-01 DIAGNOSIS — Z5321 Procedure and treatment not carried out due to patient leaving prior to being seen by health care provider: Secondary | ICD-10-CM | POA: Insufficient documentation

## 2020-03-01 DIAGNOSIS — R112 Nausea with vomiting, unspecified: Secondary | ICD-10-CM | POA: Diagnosis not present

## 2020-03-01 LAB — COMPREHENSIVE METABOLIC PANEL
ALT: 14 U/L (ref 0–44)
AST: 37 U/L (ref 15–41)
Albumin: 3.3 g/dL — ABNORMAL LOW (ref 3.5–5.0)
Alkaline Phosphatase: 140 U/L — ABNORMAL HIGH (ref 38–126)
Anion gap: 18 — ABNORMAL HIGH (ref 5–15)
BUN: 12 mg/dL (ref 6–20)
CO2: 21 mmol/L — ABNORMAL LOW (ref 22–32)
Calcium: 9.6 mg/dL (ref 8.9–10.3)
Chloride: 100 mmol/L (ref 98–111)
Creatinine, Ser: 2.25 mg/dL — ABNORMAL HIGH (ref 0.61–1.24)
GFR calc Af Amer: 37 mL/min — ABNORMAL LOW (ref >60)
GFR calc non Af Amer: 32 mL/min — ABNORMAL LOW (ref >60)
Glucose, Bld: 151 mg/dL — ABNORMAL HIGH (ref 70–99)
Potassium: 3.1 mmol/L — ABNORMAL LOW (ref 3.5–5.1)
Sodium: 139 mmol/L (ref 135–145)
Total Bilirubin: 0.8 mg/dL (ref 0.3–1.2)
Total Protein: 8.3 g/dL — ABNORMAL HIGH (ref 6.5–8.1)

## 2020-03-01 LAB — CBC
HCT: 46.9 % (ref 39.0–52.0)
Hemoglobin: 16.4 g/dL (ref 13.0–17.0)
MCH: 30.9 pg (ref 26.0–34.0)
MCHC: 35 g/dL (ref 30.0–36.0)
MCV: 88.3 fL (ref 80.0–100.0)
Platelets: 373 10*3/uL (ref 150–400)
RBC: 5.31 MIL/uL (ref 4.22–5.81)
RDW: 13.8 % (ref 11.5–15.5)
WBC: 18.9 10*3/uL — ABNORMAL HIGH (ref 4.0–10.5)
nRBC: 0 % (ref 0.0–0.2)

## 2020-03-01 LAB — LIPASE, BLOOD: Lipase: 35 U/L (ref 11–51)

## 2020-03-01 MED ORDER — ONDANSETRON 4 MG PO TBDP
4.0000 mg | ORAL_TABLET | Freq: Once | ORAL | Status: DC | PRN
  Filled 2020-03-01: qty 1

## 2020-03-01 NOTE — ED Triage Notes (Signed)
Patient presents to the ED with vomiting and diarrhea x 2 weeks.  Patient states this has happened before.  Patient states, "my vitamins and minerals got off."  Patient states he hasn't been able to eat or drink.  Patient reports vomiting x 8 and diarrhea x 2 in the past 24 hours.  Patient denies any other symptoms at this time.

## 2020-03-01 NOTE — ED Triage Notes (Signed)
Pt comes into the ED via EMS from home with c/o abd pain with n/V/D

## 2020-03-02 ENCOUNTER — Telehealth: Payer: Self-pay | Admitting: Emergency Medicine

## 2020-03-02 NOTE — Telephone Encounter (Signed)
Called patient due to lwot to inquire about condition and follow up plans. Left message.   

## 2020-03-11 ENCOUNTER — Ambulatory Visit
Payer: Medicare Other | Attending: Student in an Organized Health Care Education/Training Program | Admitting: Student in an Organized Health Care Education/Training Program

## 2020-03-11 ENCOUNTER — Encounter: Payer: Self-pay | Admitting: Student in an Organized Health Care Education/Training Program

## 2020-03-11 ENCOUNTER — Other Ambulatory Visit: Payer: Self-pay

## 2020-03-11 DIAGNOSIS — G608 Other hereditary and idiopathic neuropathies: Secondary | ICD-10-CM | POA: Diagnosis not present

## 2020-03-11 DIAGNOSIS — G894 Chronic pain syndrome: Secondary | ICD-10-CM | POA: Insufficient documentation

## 2020-03-11 DIAGNOSIS — G629 Polyneuropathy, unspecified: Secondary | ICD-10-CM | POA: Diagnosis present

## 2020-03-11 MED ORDER — PREGABALIN 150 MG PO CAPS: 150.0000 mg | ORAL_CAPSULE | Freq: Three times a day (TID) | ORAL | 2 refills | Status: DC

## 2020-03-11 MED ORDER — TIZANIDINE HCL 4 MG PO TABS
4.0000 mg | ORAL_TABLET | Freq: Two times a day (BID) | ORAL | 2 refills | Status: AC | PRN
Start: 2020-03-11 — End: 2020-06-09

## 2020-03-11 NOTE — Progress Notes (Signed)
Safety precautions to be maintained throughout the outpatient stay will include: orient to surroundings, keep bed in low position, maintain call bell within reach at all times, provide assistance with transfer out of bed and ambulation.  
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
## 2020-03-18 ENCOUNTER — Other Ambulatory Visit: Payer: Self-pay | Admitting: Student in an Organized Health Care Education/Training Program

## 2020-03-18 DIAGNOSIS — G894 Chronic pain syndrome: Secondary | ICD-10-CM

## 2020-03-18 DIAGNOSIS — G608 Other hereditary and idiopathic neuropathies: Secondary | ICD-10-CM

## 2020-03-29 ENCOUNTER — Ambulatory Visit: Payer: Medicare Other | Admitting: Student in an Organized Health Care Education/Training Program

## 2020-04-05 ENCOUNTER — Ambulatory Visit (HOSPITAL_BASED_OUTPATIENT_CLINIC_OR_DEPARTMENT_OTHER): Payer: Medicare Other | Admitting: Student in an Organized Health Care Education/Training Program

## 2020-04-05 ENCOUNTER — Encounter: Payer: Self-pay | Admitting: Student in an Organized Health Care Education/Training Program

## 2020-04-05 ENCOUNTER — Ambulatory Visit
Admission: RE | Admit: 2020-04-05 | Discharge: 2020-04-05 | Disposition: A | Discharge status: Home / Self Care | Payer: Medicare Other | Source: Ambulatory Visit | Attending: Student in an Organized Health Care Education/Training Program | Admitting: Student in an Organized Health Care Education/Training Program

## 2020-04-05 ENCOUNTER — Other Ambulatory Visit: Payer: Self-pay

## 2020-04-05 VITALS — BP 123/82 | HR 65 | Temp 97.2°F | Resp 16 | Ht 67.0 in | Wt 125.0 lb

## 2020-04-05 DIAGNOSIS — M79672 Pain in left foot: Secondary | ICD-10-CM | POA: Insufficient documentation

## 2020-04-05 DIAGNOSIS — G608 Other hereditary and idiopathic neuropathies: Secondary | ICD-10-CM | POA: Diagnosis present

## 2020-04-05 DIAGNOSIS — M792 Neuralgia and neuritis, unspecified: Secondary | ICD-10-CM | POA: Insufficient documentation

## 2020-04-05 DIAGNOSIS — M79671 Pain in right foot: Secondary | ICD-10-CM | POA: Diagnosis not present

## 2020-04-05 DIAGNOSIS — G629 Polyneuropathy, unspecified: Secondary | ICD-10-CM

## 2020-04-05 DIAGNOSIS — G894 Chronic pain syndrome: Secondary | ICD-10-CM | POA: Diagnosis not present

## 2020-04-05 MED ORDER — CEPHALEXIN 500 MG PO CAPS: 500.0000 mg | ORAL_CAPSULE | Freq: Four times a day (QID) | ORAL | 0 refills | Status: AC

## 2020-04-05 MED ORDER — MIDAZOLAM HCL 5 MG/5ML IJ SOLN
INTRAMUSCULAR | Status: AC
  Filled 2020-04-05: qty 5

## 2020-04-05 MED ORDER — LIDOCAINE HCL 2 % IJ SOLN
20.0000 mL | Freq: Once | INTRAMUSCULAR | Status: AC
  Administered 2020-04-05: 400 mg

## 2020-04-05 MED ORDER — FENTANYL CITRATE (PF) 100 MCG/2ML IJ SOLN
25.0000 ug | INTRAMUSCULAR | Status: AC | PRN
  Administered 2020-04-05: 25 ug via INTRAVENOUS
  Administered 2020-04-05: 50 ug via INTRAVENOUS

## 2020-04-05 MED ORDER — ROPIVACAINE HCL 2 MG/ML IJ SOLN
INTRAMUSCULAR | Status: AC
  Filled 2020-04-05: qty 10

## 2020-04-05 MED ORDER — SODIUM CHLORIDE (PF) 0.9 % IJ SOLN
INTRAMUSCULAR | Status: AC
  Filled 2020-04-05: qty 10

## 2020-04-05 MED ORDER — CEFAZOLIN SODIUM 1 G IJ SOLR
INTRAMUSCULAR | Status: AC
  Filled 2020-04-05: qty 20

## 2020-04-05 MED ORDER — LIDOCAINE HCL 2 % IJ SOLN
INTRAMUSCULAR | Status: AC
  Filled 2020-04-05: qty 20

## 2020-04-05 MED ORDER — DEXAMETHASONE SODIUM PHOSPHATE 10 MG/ML IJ SOLN
INTRAMUSCULAR | Status: AC
  Filled 2020-04-05: qty 1

## 2020-04-05 MED ORDER — FENTANYL CITRATE (PF) 100 MCG/2ML IJ SOLN
INTRAMUSCULAR | Status: AC
  Filled 2020-04-05: qty 2

## 2020-04-05 MED ORDER — PREGABALIN 150 MG PO CAPS: 150.0000 mg | ORAL_CAPSULE | Freq: Three times a day (TID) | ORAL | 2 refills | Status: DC

## 2020-04-05 NOTE — Progress Notes (Signed)
Safety precautions to be maintained throughout the outpatient stay will include: orient to surroundings, keep bed in low position, maintain call bell within reach at all times, provide assistance with transfer out of bed and ambulation.  
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
## 2020-04-05 NOTE — Patient Instructions (Addendum)
Today we did the following   -We have done a Spinal Cord Stimulator Trial with [employer]  -As long as the leads are in place, do not bathe or shower. You may sponge bathe.  -While the lead is in place, please limit the bending, lifting, or twisting because the lead can move.  -The things we want to see is if your pain improves (and by what percentage), if you can do more activity (don't overdo it), and if you can use less of your "as needed" medicine. Do not stop long acting medicines like methadone, oxycontin, MS Contin, etc without checking with [place].  -It is VERY important that you pick up the antibiotics we prescribed, Keflex, on your way home from the trial and take them as prescribed(4 times a day), starting today, for as long as the lead is in place.  -The Spina Cord Stimulator Representative will be in contact with you while the lead is in place to make sure the trial goes as well as possible.  -Please contact us with any questions or concerns at any time during the trial.   -If you start running a fever over 100 degrees, have severe back pain, or new pain running down the legs, or drainage coming from the lead site, contact us immediately and/or go to the emergency room.  -Please do not restart any sort of medication that can thin your blood such as Aspirin, ibuprofen, motrin, aleve, plavix, coumadin, etc. If you aren't sure, call and ask.  -We will have you return on 04/12/20  to have the lead removed. If this is successful, at that point we can go over the details about the permanent implant.       Post-procedure Information What to expect: Most procedures involve the use of a local anesthetic (numbing medicine), and a steroid (anti-inflammatory medicine).  The local anesthetics may cause temporary numbness and weakness of the legs or arms, depending on the location of the block. This numbness/weakness may last 4-6 hours, depending on the local anesthetic used. In rare  instances, it can last up to 24 hours. While numb, you must be very careful not to injure the extremity.  After any procedure, you could expect the pain to get better within 15-20 minutes. This relief is temporary and may last 4-6 hours. Once the local anesthetics wears off, you could experience discomfort, possibly more than usual, for up to 10 (ten) days. In the case of radiofrequencies, it may last up to 6 weeks. Surgeries may take up to 8 weeks for the healing process. The discomfort is due to the irritation caused by needles going through skin and muscle. To minimize the discomfort, we recommend using ice the first day, and heat from then on. The ice should be applied for 15 minutes on, and 15 minutes off. Keep repeating this cycle until bedtime. Avoid applying the ice directly to the skin, to prevent frostbite. Heat should be used daily, until the pain improves (4-10 days). Be careful not to burn yourself.  Occasionally you may experience muscle spasms or cramps. These occur as a consequence of the irritation caused by the needle sticks to the muscle and the blood that will inevitably be lost into the surrounding muscle tissue. Blood tends to be very irritating to tissues, which tend to react by going into spasm. These spasms may start the same day of your procedure, but they may also take days to develop. This late onset type of spasm or cramp is usually  caused by electrolyte imbalances triggered by the steroids, at the level of the kidney. Cramps and spasms tend to respond well to muscle relaxants, multivitamins (some are triggered by the procedure, but may have their origins in vitamin deficiencies), and "Gatorade", or any sports drinks that can replenish any electrolyte imbalances. (If you are a diabetic, ask your pharmacist to get you a sugar-free brand.) Warm showers or baths may also be helpful. Stretching exercises are highly recommended. General Instructions:  Be alert for signs of possible  infection: redness, swelling, heat, red streaks, elevated temperature, and/or fever. These typically appear 4 to 6 days after the procedure. Immediately notify your doctor if you experience unusual bleeding, difficulty breathing, or loss of bowel or bladder control. If you experience increased pain, do not increase your pain medicine intake, unless instructed by your pain physician. Post-Procedure Care:  Be careful in moving about. Muscle spasms in the area of the injection may occur. Applying ice or heat to the area is often helpful. The incidence of spinal headaches after epidural injections ranges between 1.4% and 6%. If you develop a headache that does not seem to respond to conservative therapy, please let your physician know. This can be treated with an epidural blood patch.   Post-procedure numbness or redness is to be expected, however it should average 4 to 6 hours. If numbness and weakness of your extremities begins to develop 4 to 6 hours after your procedure, and is felt to be progressing and worsening, immediately contact your physician.   Diet:  If you experience nausea, do not eat until this sensation goes away. If you had a "Stellate Ganglion Block" for upper extremity "Reflex Sympathetic Dystrophy", do not eat or drink until your hoarseness goes away. In any case, always start with liquids first and if you tolerate them well, then slowly progress to more solid foods. Activity:  For the first 4 to 6 hours after the procedure, use caution in moving about as you may experience numbness and/or weakness. Use caution in cooking, using household electrical appliances, and climbing steps. If you need to reach your Doctor call our office: [phone]) [phone] Monday-Thursday 8:00 am - 4:00 PM    Fridays: Closed     In case of an emergency: In case of emergency, call 911 or go to the nearest emergency room and have the physician there call us.  Interpretation of Procedure Every nerve block has two  components: a diagnostic component, and a treatment component. Unrealistic expectations are the most common causes of "perceived failure".  In a perfect world, a single nerve block should be able to completely and permanently eliminate the pain. Sadly, the world is not perfect.  Most pain management nerve blocks are performed using local anesthetics and steroids. Steroids are responsible for any long-term benefit that you may experience. Their purpose is to decrease any chronic swelling that may exist in the area. Steroids begin to work immediately after being injected. However, most patients will not experience any benefits until 5 to 10 days after the injection, when the swelling has come down to the point where they can tell a difference. Steroids will only help if there is swelling to be treated. As such, they can assist with the diagnosis. If effective, they suggest an inflammatory component to the pain, and if ineffective, they rule out inflammation as the main cause or component of the problem. If the problem is one of mechanical compression, you will get no benefit from those steroids.  In the case of local anesthetics, they have a crucial role in the diagnosis of your condition. Most will begin to work within15 to 20 minutes after injection. The duration will depend on the type used (short- vs. Long-acting). It is of outmost importance that patients keep tract of their pain, after the procedure. To assist with this matter, a "Post-procedure Pain Diary" is provided. Make sure to complete it and to bring it back to your follow-up appointment.  As long as the patient keeps accurate, detailed records of their symptoms after every procedure, and returns to have those interpreted, every procedure will provide [place] with invaluable information. Even a block that does not provide the patient with any relief, will always provide [place] with information about the mechanism and the origin of the pain. The only time a  nerve block can be considered a waste of time is when patients do not keep track of the results, or do not keep their post-procedure appointment.  Reporting the results back to your physician The Pain Score  Pain is a subjective complaint. It cannot be seen, touched, or measured. We depend entirely on the patient's report of the pain in order to assess your condition and treatment. To evaluate the pain, we use a pain scale, where "0" means "No Pain", and a "10" is "the worst possible pain that you can even imagine" (i.e. something like been eaten alive by a shark or being torn apart by a lion).   You will frequently be asked to rate your pain. Please be as accurate, remember that medical decisions will be based on your responses. Please do not rate your pain above a 10. Doing so is actually interpreted as "symptom magnification" (exaggeration), as well as lack of understanding with regards to the scale. To put this into perspective, when you tell us that your pain is at a 10 (ten), what you are saying is that there is nothing we can do to make this pain any worse. (Carefully think about that.)

## 2020-04-06 ENCOUNTER — Telehealth: Payer: Self-pay | Admitting: *Deleted

## 2020-04-06 NOTE — Telephone Encounter (Signed)
Attempted to call for post procedure follow-up. Message left. 

## 2020-04-12 ENCOUNTER — Ambulatory Visit
Admission: RE | Admit: 2020-04-12 | Discharge: 2020-04-12 | Disposition: A | Discharge status: Home / Self Care | Payer: Medicare Other | Source: Ambulatory Visit | Attending: Student in an Organized Health Care Education/Training Program | Admitting: Student in an Organized Health Care Education/Training Program

## 2020-04-12 ENCOUNTER — Ambulatory Visit (HOSPITAL_BASED_OUTPATIENT_CLINIC_OR_DEPARTMENT_OTHER): Payer: Medicare Other | Admitting: Student in an Organized Health Care Education/Training Program

## 2020-04-12 ENCOUNTER — Encounter: Payer: Self-pay | Admitting: Student in an Organized Health Care Education/Training Program

## 2020-04-12 ENCOUNTER — Other Ambulatory Visit: Payer: Self-pay

## 2020-04-12 VITALS — BP 106/69 | HR 83 | Temp 97.2°F | Resp 16 | Ht 67.0 in | Wt 125.0 lb

## 2020-04-12 DIAGNOSIS — G629 Polyneuropathy, unspecified: Secondary | ICD-10-CM | POA: Insufficient documentation

## 2020-04-12 DIAGNOSIS — G608 Other hereditary and idiopathic neuropathies: Secondary | ICD-10-CM | POA: Insufficient documentation

## 2020-04-12 DIAGNOSIS — M792 Neuralgia and neuritis, unspecified: Secondary | ICD-10-CM
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
## 2020-04-12 NOTE — Patient Instructions (Signed)

## 2020-04-12 NOTE — Progress Notes (Signed)
Safety precautions to be maintained throughout the outpatient stay will include: orient to surroundings, keep bed in low position, maintain call bell within reach at all times, provide assistance with transfer out of bed and ambulation.  

## 2020-04-13 ENCOUNTER — Telehealth: Payer: Self-pay

## 2020-04-13 NOTE — Telephone Encounter (Signed)
POst proccedure phone call.  LM

## 2020-04-27 ENCOUNTER — Other Ambulatory Visit (HOSPITAL_COMMUNITY): Payer: Self-pay | Admitting: Neurosurgery

## 2020-04-27 ENCOUNTER — Other Ambulatory Visit: Payer: Self-pay | Admitting: Neurosurgery

## 2020-04-27 DIAGNOSIS — G894 Chronic pain syndrome: Secondary | ICD-10-CM

## 2020-04-29 ENCOUNTER — Other Ambulatory Visit
Admission: RE | Admit: 2020-04-29 | Discharge: 2020-04-29 | Disposition: A | Discharge status: Home / Self Care | Payer: Medicare Other | Attending: Gastroenterology | Admitting: Gastroenterology

## 2020-04-29 ENCOUNTER — Encounter: Payer: Self-pay | Admitting: Gastroenterology

## 2020-04-29 ENCOUNTER — Other Ambulatory Visit: Payer: Self-pay

## 2020-04-29 ENCOUNTER — Ambulatory Visit (INDEPENDENT_AMBULATORY_CARE_PROVIDER_SITE_OTHER): Payer: Medicare Other | Admitting: Gastroenterology

## 2020-04-29 ENCOUNTER — Other Ambulatory Visit: Payer: Self-pay | Admitting: Neurosurgery

## 2020-04-29 VITALS — BP 102/67 | HR 87 | Temp 97.0°F | Ht 67.0 in | Wt 122.4 lb

## 2020-04-29 DIAGNOSIS — Z01812 Encounter for preprocedural laboratory examination: Secondary | ICD-10-CM | POA: Insufficient documentation

## 2020-04-29 DIAGNOSIS — R634 Abnormal weight loss: Secondary | ICD-10-CM | POA: Diagnosis not present

## 2020-04-29 LAB — CREATININE, SERUM
Creatinine, Ser: 1.25 mg/dL — ABNORMAL HIGH (ref 0.61–1.24)
GFR, Estimated: >60 mL/min (ref >60)

## 2020-04-29 MED ORDER — FLUCONAZOLE 100 MG PO TABS: 100.0000 mg | ORAL_TABLET | Freq: Every day | ORAL | 0 refills | Status: DC
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
## 2020-04-30 ENCOUNTER — Ambulatory Visit
Admission: RE | Admit: 2020-04-30 | Discharge: 2020-04-30 | Disposition: A | Discharge status: Home / Self Care | Payer: Medicare Other | Source: Ambulatory Visit | Attending: Neurosurgery | Admitting: Neurosurgery

## 2020-04-30 DIAGNOSIS — G894 Chronic pain syndrome: Secondary | ICD-10-CM | POA: Insufficient documentation

## 2020-05-04 ENCOUNTER — Encounter
Admission: RE | Admit: 2020-05-04 | Discharge: 2020-05-04 | Disposition: A | Discharge status: Home / Self Care | Payer: Medicare Other | Source: Ambulatory Visit | Attending: Neurosurgery | Admitting: Neurosurgery

## 2020-05-04 ENCOUNTER — Other Ambulatory Visit: Payer: Self-pay

## 2020-05-04 ENCOUNTER — Encounter: Payer: Self-pay | Admitting: Urgent Care

## 2020-05-04 ENCOUNTER — Telehealth: Payer: Self-pay

## 2020-05-04 DIAGNOSIS — Z01812 Encounter for preprocedural laboratory examination: Secondary | ICD-10-CM | POA: Diagnosis present

## 2020-05-04 HISTORY — DX: Vitamin D deficiency, unspecified: E55.9

## 2020-05-04 LAB — CBC
HCT: 41.4 % (ref 39.0–52.0)
Hemoglobin: 13.3 g/dL (ref 13.0–17.0)
MCH: 30.8 pg (ref 26.0–34.0)
MCHC: 32.1 g/dL (ref 30.0–36.0)
MCV: 95.8 fL (ref 80.0–100.0)
Platelets: 282 10*3/uL (ref 150–400)
RBC: 4.32 MIL/uL (ref 4.22–5.81)
RDW: 13.8 % (ref 11.5–15.5)
WBC: 16.6 10*3/uL — ABNORMAL HIGH (ref 4.0–10.5)
nRBC: 0 % (ref 0.0–0.2)

## 2020-05-04 LAB — BASIC METABOLIC PANEL
Anion gap: 8 (ref 5–15)
BUN: 7 mg/dL (ref 6–20)
CO2: 24 mmol/L (ref 22–32)
Calcium: 8.4 mg/dL — ABNORMAL LOW (ref 8.9–10.3)
Chloride: 108 mmol/L (ref 98–111)
Creatinine, Ser: 1.26 mg/dL — ABNORMAL HIGH (ref 0.61–1.24)
GFR, Estimated: >60 mL/min (ref >60)
Glucose, Bld: 94 mg/dL (ref 70–99)
Potassium: 3.1 mmol/L — ABNORMAL LOW (ref 3.5–5.1)
Sodium: 140 mmol/L (ref 135–145)

## 2020-05-04 LAB — TYPE AND SCREEN
ABO/RH(D): A POS
Antibody Screen: NEGATIVE

## 2020-05-04 LAB — APTT: aPTT: 34 seconds (ref 24–36)

## 2020-05-04 LAB — URINALYSIS, ROUTINE W REFLEX MICROSCOPIC
Bilirubin Urine: NEGATIVE
Glucose, UA: NEGATIVE mg/dL
Hgb urine dipstick: NEGATIVE
Ketones, ur: NEGATIVE mg/dL
Leukocytes,Ua: NEGATIVE
Nitrite: NEGATIVE
Protein, ur: NEGATIVE mg/dL
Specific Gravity, Urine: 1.01 (ref 1.005–1.030)
pH: 5 (ref 5.0–8.0)

## 2020-05-04 LAB — SURGICAL PCR SCREEN
MRSA, PCR: NEGATIVE
Staphylococcus aureus: NEGATIVE

## 2020-05-04 LAB — PROTIME-INR
INR: 1.1 (ref 0.8–1.2)
Prothrombin Time: 13.8 seconds (ref 11.4–15.2)

## 2020-05-04 MED ORDER — CEFAZOLIN SODIUM-DEXTROSE 2-4 GM/100ML-% IV SOLN
2.0000 g | Freq: Once | INTRAVENOUS | Status: DC
  Filled 2020-05-04: qty 100

## 2020-05-04 NOTE — Telephone Encounter (Signed)
Patient LVM yesterday in regards to colonoscopy he stated is scheduled for Wednesday.  I returned his call LVM for him to call me back to discuss, because I did not see that he was scheduled for a colonoscopy.  Thanks,  [place], [place]

## 2020-05-04 NOTE — Patient Instructions (Addendum)
Your procedure is scheduled on: Monday, November 22 Report to the Registration Desk on the 1st floor of the [facility]. To find out your arrival time, please call [phone] between 1PM - 3PM on:  Friday, November 19  REMEMBER: Instructions that are not followed completely may result in serious medical risk, up to and including death; or upon the discretion of your surgeon and anesthesiologist your surgery may need to be rescheduled.  Do not eat food after midnight the night before surgery.  No gum chewing, lozengers or hard candies.  You may however, drink CLEAR liquids up to 2 hours before you are scheduled to arrive for your surgery. Do not drink anything within 2 hours of your scheduled arrival time.  Clear liquids include: - water  - apple juice without pulp - gatorade (not RED, PURPLE, OR BLUE) - black coffee or tea (Do NOT add milk or creamers to the coffee or tea) Do NOT drink anything that is not on this list.  TAKE THESE MEDICATIONS THE MORNING OF SURGERY WITH A SIP OF WATER:  1.  Duloxetine (cymbalta) 2.  duoneb nebulizer 3.  Metoprolol 4.  Omeprazole - (take one the night before and one on the morning of surgery - helps to prevent nausea after surgery.) 5.  pregabalin (Lyrica)  Follow recommendations from Cardiologist, Pulmonologist or PCP regarding stopping Aspirin. STOP ASPIRIN 1 WEEK PRIOR TO SURGERY. ALREADY STOPPED OVER A WEEK AGO.  One week prior to surgery: Stop Anti-inflammatories (NSAIDS) such as Advil, Aleve, Ibuprofen, Motrin, Naproxen, Naprosyn and Aspirin based products such as Excedrin, Goodys Powder, BC Powder. Stop ANY OVER THE COUNTER supplements until after surgery. (However, you may continue taking Vitamin D, Vitamin B, and multivitamin up until the day before surgery.)  No Alcohol for 24 hours before or after surgery.  No Smoking including e-cigarettes for 24 hours prior to surgery.  No chewable tobacco products for at least 6 hours prior to  surgery.  No nicotine patches on the day of surgery.  Do not use any "recreational" drugs for at least a week prior to your surgery.  Please be advised that the combination of cocaine and anesthesia may have negative outcomes, up to and including death. If you test positive for cocaine, your surgery will be cancelled.  On the morning of surgery brush your teeth with toothpaste and water, you may rinse your mouth with mouthwash if you wish. Do not swallow any toothpaste or mouthwash.  Do not wear jewelry.  Do not wear lotions, powders, or perfumes.   Do not shave body from the neck down 48 hours prior to surgery just in case you cut yourself which could leave a site for infection.  Also, freshly shaved skin may become irritated if using the CHG soap.  Contact lenses, hearing aids and dentures may not be worn into surgery.  Do not bring valuables to the hospital. [facility] is not responsible for any missing/lost belongings or valuables.   Use CHG Soap as directed on instruction sheet.  Notify your doctor if there is any change in your medical condition (cold, fever, infection).  Wear comfortable clothing (specific to your surgery type) to the hospital.  Plan for stool softeners for home use; pain medications have a tendency to cause constipation. You can also help prevent constipation by eating foods high in fiber such as fruits and vegetables and drinking plenty of fluids as your diet allows.  After surgery, you can help prevent lung complications by doing breathing  exercises.  Take deep breaths and cough every 1-2 hours. Your doctor may order a device called an Incentive Spirometer to help you take deep breaths.  If you are being discharged the day of surgery, you will not be allowed to drive home. You will need a responsible adult (18 years or older) to drive you home and stay with you that night.   If you are taking public transportation, you will need to have a responsible  adult (18 years or older) with you. Please confirm with your physician that it is acceptable to use public transportation.   Please call the [place] Dept. at [phone] if you have any questions about these instructions.  Visitation Policy:  Patients undergoing a surgery or procedure may have one family member or support person with them as long as that person is not COVID-19 positive or experiencing its symptoms.  That person may remain in the waiting area during the procedure.

## 2020-05-04 NOTE — Progress Notes (Signed)
  [facility] Perioperative Services: Pre-Admission/Anesthesia Testing  Abnormal Lab Notification   Date: 05/04/20  Name: RONNE STEFANSKI MRN:   [id number]  Re: Abnormal labs noted during PAT appointment   Provider(s) Notified: Deetta Perla, MD Notification mode: Routed and/or faxed via [facility]   ABNORMAL LAB VALUE(S): Lab Results  Component Value Date   WBC 16.6 (H) 05/04/2020   K 3.1 (L) 05/04/2020   Notes:   Patient is scheduled for a THORACIC SPINAL CORD STIMULATOR, PULSE GENERATOR placement on 05/10/2020.   Patient experiencing GI issues over the course of the past 4 weeks; (+) dysphagia, N/V/D, and unexplained weight loss. He has multiple  endoscopic evaluations; "no less than 8 endoscopies" per Dr. Allen Norris. Currently undergoing treatment for recurrent esophageal candidiasis that was found during last EGD. GI is continuing workup for aforementioned symptoms. Plans are for UGI, CT scan of his abdomen/pelvis, and SIBO breath testing over the course of the next few days.    Spoke with Genevie Cheshire, RN who advised that MD aware of K+ level. PO K+ supplementation has been ordered and patient has been advised to utilize ORS to correct electrolyte derangement. Office not aware of WBC count. RN to make MD aware. Will need to discuss plans for surgery given GI issues. May need to consider postponing until GI issues are evaluated and/or controlled. RN to discuss with MD and let me know his thoughts on proceeding vs. postponing.   Honor Loh, MS//N, APRN, FNP-C, CEN [facility]  Peri-operative Services Nurse Practitioner Phone: [phone] 05/04/20 5:21 PM

## 2020-05-05 ENCOUNTER — Other Ambulatory Visit: Payer: Self-pay

## 2020-05-05 ENCOUNTER — Ambulatory Visit
Admission: RE | Admit: 2020-05-05 | Discharge: 2020-05-05 | Disposition: A | Discharge status: Home / Self Care | Payer: Medicare Other | Source: Ambulatory Visit | Attending: Gastroenterology | Admitting: Gastroenterology

## 2020-05-05 ENCOUNTER — Telehealth: Payer: Self-pay

## 2020-05-05 ENCOUNTER — Encounter: Payer: Self-pay | Admitting: Neurosurgery

## 2020-05-05 DIAGNOSIS — R634 Abnormal weight loss: Secondary | ICD-10-CM | POA: Diagnosis not present

## 2020-05-05 DIAGNOSIS — R197 Diarrhea, unspecified: Secondary | ICD-10-CM

## 2020-05-05 DIAGNOSIS — R112 Nausea with vomiting, unspecified: Secondary | ICD-10-CM

## 2020-05-05 MED ORDER — IOHEXOL 300 MG/ML  SOLN
100.0000 mL | Freq: Once | INTRAMUSCULAR | Status: AC | PRN
  Administered 2020-05-05: 85 mL via INTRAVENOUS
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
## 2020-05-05 NOTE — Telephone Encounter (Signed)
Patient states he will come to [facility] [place] today to pick up the stool sample

## 2020-05-05 NOTE — Telephone Encounter (Signed)
Dr. Allen Norris wants patient to come in for a STAT GI profile stool. He states his neurosurgereon contacted him and was concerned about infection. He informed him that we would order this form him. Order STAT GI profile

## 2020-05-06 ENCOUNTER — Telehealth: Payer: Self-pay

## 2020-05-06 NOTE — Telephone Encounter (Signed)
-----   Message from Lucilla Lame, MD sent at 05/06/2020  7:35 AM EST ----- Let the patient know that the CT scan of the abdomen did not show any masses or cancers or signs of inflammation.

## 2020-05-06 NOTE — Telephone Encounter (Signed)
Patient verbalized understanding of results  

## 2020-05-07 ENCOUNTER — Other Ambulatory Visit: Admission: RE | Admit: 2020-05-07 | Payer: Medicare Other | Source: Ambulatory Visit

## 2020-05-10 ENCOUNTER — Encounter: Admission: RE | Payer: Self-pay | Source: Home / Self Care

## 2020-05-10 ENCOUNTER — Ambulatory Visit: Admission: RE | Admit: 2020-05-10 | Payer: Medicare Other | Source: Home / Self Care | Admitting: Neurosurgery

## 2020-05-10 HISTORY — DX: Atherosclerotic heart disease of native coronary artery without angina pectoris: I25.10

## 2020-05-10 HISTORY — DX: Personal history of other diseases of the digestive system: Z87.19

## 2020-05-10 HISTORY — DX: Candidal esophagitis: B37.81

## 2020-05-10 HISTORY — DX: Angina pectoris, unspecified: I20.9

## 2020-05-10 HISTORY — DX: Hyperlipidemia, unspecified: E78.5

## 2020-05-10 SURGERY — INSERTION, SPINAL CORD STIMULATOR, CERVICAL
Anesthesia: General

## 2020-05-12 ENCOUNTER — Telehealth: Payer: Self-pay

## 2020-05-12 NOTE — Telephone Encounter (Signed)
Left vm for pt to return my call regarding stool studies.

## 2020-05-12 NOTE — Telephone Encounter (Signed)
Marcie Bal, pt's relative returned call and advised that the pt will go today to do the stool studies.

## 2020-06-21 ENCOUNTER — Other Ambulatory Visit: Payer: Self-pay | Admitting: Neurosurgery

## 2020-06-22 ENCOUNTER — Other Ambulatory Visit: Payer: Self-pay | Admitting: Student in an Organized Health Care Education/Training Program

## 2020-06-29 ENCOUNTER — Telehealth: Payer: Self-pay

## 2020-06-29 DIAGNOSIS — G894 Chronic pain syndrome: Secondary | ICD-10-CM

## 2020-06-29 NOTE — Telephone Encounter (Signed)
He wants to know if Dr. Holley Raring will refill his tizanidine

## 2020-06-29 NOTE — Telephone Encounter (Signed)
Last prescribed 04-12-20.  Last appt 03-11-20. No future appts, last orders were to return if symptoms return or fail to improve.

## 2020-06-30 ENCOUNTER — Telehealth: Payer: Self-pay | Admitting: *Deleted

## 2020-06-30 MED ORDER — TIZANIDINE HCL 4 MG PO TABS: 4.0000 mg | ORAL_TABLET | Freq: Three times a day (TID) | ORAL | 5 refills | Status: DC | PRN

## 2020-06-30 NOTE — Telephone Encounter (Signed)
Patient notified per voicemail that script for Tizanidine has been sent to pharmacy.

## 2020-07-05 ENCOUNTER — Inpatient Hospital Stay: Admission: RE | Admit: 2020-07-05 | Payer: Medicare Other | Source: Ambulatory Visit

## 2020-07-07 ENCOUNTER — Other Ambulatory Visit: Payer: Self-pay

## 2020-07-07 ENCOUNTER — Inpatient Hospital Stay: Admission: RE | Admit: 2020-07-07 | Payer: Medicare Other | Source: Ambulatory Visit

## 2020-07-07 ENCOUNTER — Encounter
Admission: RE | Admit: 2020-07-07 | Discharge: 2020-07-07 | Disposition: A | Discharge status: Home / Self Care | Payer: Medicare Other | Source: Ambulatory Visit | Attending: Neurosurgery | Admitting: Neurosurgery

## 2020-07-07 DIAGNOSIS — Z01818 Encounter for other preprocedural examination: Secondary | ICD-10-CM | POA: Insufficient documentation

## 2020-07-07 HISTORY — DX: Hypokalemia: E87.6

## 2020-07-07 LAB — SURGICAL PCR SCREEN
MRSA, PCR: NEGATIVE
Staphylococcus aureus: NEGATIVE

## 2020-07-07 LAB — BASIC METABOLIC PANEL
Anion gap: 9 (ref 5–15)
BUN: 9 mg/dL (ref 6–20)
CO2: 24 mmol/L (ref 22–32)
Calcium: 9 mg/dL (ref 8.9–10.3)
Chloride: 108 mmol/L (ref 98–111)
Creatinine, Ser: 1.51 mg/dL — ABNORMAL HIGH (ref 0.61–1.24)
GFR, Estimated: 55 mL/min — ABNORMAL LOW (ref >60)
Glucose, Bld: 99 mg/dL (ref 70–99)
Potassium: 2.8 mmol/L — ABNORMAL LOW (ref 3.5–5.1)
Sodium: 141 mmol/L (ref 135–145)

## 2020-07-07 LAB — URINALYSIS, ROUTINE W REFLEX MICROSCOPIC
Bacteria, UA: NONE SEEN
Bilirubin Urine: NEGATIVE
Glucose, UA: NEGATIVE mg/dL
Hgb urine dipstick: NEGATIVE
Ketones, ur: NEGATIVE mg/dL
Leukocytes,Ua: NEGATIVE
Nitrite: NEGATIVE
Protein, ur: 100 mg/dL — AB
Specific Gravity, Urine: 1.019 (ref 1.005–1.030)
pH: 5 (ref 5.0–8.0)

## 2020-07-07 LAB — APTT: aPTT: 31 seconds (ref 24–36)

## 2020-07-07 LAB — CBC
HCT: 43.8 % (ref 39.0–52.0)
Hemoglobin: 14.3 g/dL (ref 13.0–17.0)
MCH: 30.2 pg (ref 26.0–34.0)
MCHC: 32.6 g/dL (ref 30.0–36.0)
MCV: 92.4 fL (ref 80.0–100.0)
Platelets: 247 10*3/uL (ref 150–400)
RBC: 4.74 MIL/uL (ref 4.22–5.81)
RDW: 13.5 % (ref 11.5–15.5)
WBC: 14.1 10*3/uL — ABNORMAL HIGH (ref 4.0–10.5)
nRBC: 0 % (ref 0.0–0.2)

## 2020-07-07 LAB — TYPE AND SCREEN
ABO/RH(D): A POS
Antibody Screen: NEGATIVE

## 2020-07-07 LAB — PROTIME-INR
INR: 1.1 (ref 0.8–1.2)
Prothrombin Time: 13.3 seconds (ref 11.4–15.2)

## 2020-07-07 NOTE — Patient Instructions (Signed)
Your procedure is scheduled on: 07/12/20 Report to [place]. To find out your arrival time please call [phone] between 1PM - 3PM on 07/09/20.  Remember: Instructions that are not followed completely may result in serious medical risk, up to and including death, or upon the discretion of your surgeon and anesthesiologist your surgery may need to be rescheduled.     _X__ 1. Do not eat food after midnight the night before your procedure.                 No gum chewing or hard candies. You may drink clear liquids up to 2 hours                 before you are scheduled to arrive for your surgery- DO not drink clear                 liquids within 2 hours of the start of your surgery.                 Clear Liquids include:  water, apple juice without pulp, clear carbohydrate                 drink such as Clearfast or Gatorade, Black Coffee or Tea (Do not add                 anything to coffee or tea). Diabetics water only  __X__2.  On the morning of surgery brush your teeth with toothpaste and water, you                 may rinse your mouth with mouthwash if you wish.  Do not swallow any              toothpaste of mouthwash.     _X__ 3.  No Alcohol for 24 hours before or after surgery.   _X__ 4.  Do Not Smoke or use e-cigarettes For 24 Hours Prior to Your Surgery.                 Do not use any chewable tobacco products for at least 6 hours prior to                 surgery.  ____  5.  Bring all medications with you on the day of surgery if instructed.   __X__  6.  Notify your doctor if there is any change in your medical condition      (cold, fever, infections).     Do not wear jewelry, make-up, hairpins, clips or nail polish. Do not wear lotions, powders, or perfumes.  Do not shave 48 hours prior to surgery. Men may shave face and neck. Do not bring valuables to the hospital.    [facility] is not responsible for any belongings or  valuables.  Contacts, dentures/partials or body piercings may not be worn into surgery. Bring a case for your contacts, glasses or hearing aids, a denture cup will be supplied. Leave your suitcase in the car. After surgery it may be brought to your room. For patients admitted to the hospital, discharge time is determined by your treatment team.   Patients discharged the day of surgery will not be allowed to drive home.   Please read over the following fact sheets that you were given:   MRSA Information, chg  __X__ Take these medicines the morning of surgery with A SIP OF WATER:  1. atorvastatin (LIPITOR) 20 MG tablet  2. DULoxetine (CYMBALTA) 60 MG capsule  3. omeprazole (PRILOSEC) 40 MG capsule  4. pregabalin (LYRICA) 150 MG capsule  5.  6.  ____ Fleet Enema (as directed)   __X__ Use CHG Soap/SAGE wipes as directed  __X__ Use inhalers on the day of surgery  (USE YOUR NEBULIZER THE MORNING OF SURGERY)  ____ Stop metformin/Janumet/Farxiga 2 days prior to surgery    ____ Take 1/2 of usual insulin dose the night before surgery. No insulin the morning          of surgery.   ____ Stop Blood Thinners Coumadin/Plavix/Xarelto/Pleta/Pradaxa/Eliquis/Effient/Aspirin  on   Or contact your Surgeon, Cardiologist or Medical Doctor regarding  ability to stop your blood thinners  __X__ Stop Anti-inflammatories 7 days before surgery such as Advil, Ibuprofen, Motrin,  BC or Goodies Powder, Naprosyn, Naproxen, Aleve, Aspirin    __X__ Stop all herbal supplements, fish oil or vitamin E until after surgery.    ____ Bring C-Pap to the hospital.

## 2020-07-07 NOTE — Progress Notes (Signed)
  [facility] Perioperative Services: Pre-Admission/Anesthesia Testing  Abnormal Lab Notification   Date: 07/07/20  Name: Kevin Alexander MRN:   [id number]  Re: Abnormal labs noted during PAT appointment   Provider(s) Notified: Deetta Perla, MD Notification mode: Routed and/or faxed via [facility]   ABNORMAL LAB VALUE(S): Lab Results  Component Value Date   WBC 14.1 (H) 07/07/2020   K 2.8 (L) 07/07/2020    Notes:  Patient is scheduled for a THORACIC SPINAL CORD STIMULATOR, PULSE GENERATOR on 07/12/2020.    1. WBC remains elevated (previously 16.6 on 05/04/2020). Patient feels generally well with no known current infection. He has not recently taken any corticosteroids. Historical labs reviewed. Since 2019, WBC count has been intermittently elevated ranging from normal (8.4) to elevations as high as 19.0.   2. Patient has a history of chronic HYPOkalemia. He DOES NOT take any type of diuretic medications. In review of his medication list, medications that could cause hypokalemia in this patient could potentially be his albuterol and tizanidine. Spoke with patient to discuss. Patient is NOT having daily diarrhea like he was back in 04/2020 when K+ was low. He notes that he has loose stools "off and on" and "here and there". He is already taking KDur 20 mEq daily; confirmed dose with him. If efforts to correct current derangement, will proceed with oral supplementation with subsequent retesting.   Plan:  Patient to take 20 mEq BID until the day of surgery; will start 07/08/2020. Patient has a sufficient supply of medication at home and does not need a new Rx per his report.     Order placed to have K+ rechecked on the morning of surgery to ensure that level has been sufficiently optimized prior to his surgical/anesthetic course.   Notification sent to attending surgeon to make him aware of above lab values and plan of care.   This is a Community education officer; no formal response  is required.  Honor Loh, MSN, APRN, FNP-C, CEN [facility]  Peri-operative Services Nurse Practitioner Phone: [phone] Fax: [phone] 07/07/20 7:00 PM

## 2020-07-09 ENCOUNTER — Other Ambulatory Visit
Admission: RE | Admit: 2020-07-09 | Discharge: 2020-07-09 | Disposition: A | Discharge status: Home / Self Care | Payer: Medicare Other | Source: Ambulatory Visit | Attending: Neurosurgery | Admitting: Neurosurgery

## 2020-07-09 ENCOUNTER — Other Ambulatory Visit: Payer: Self-pay

## 2020-07-09 DIAGNOSIS — Z20822 Contact with and (suspected) exposure to covid-19: Secondary | ICD-10-CM | POA: Diagnosis not present

## 2020-07-09 DIAGNOSIS — Z01812 Encounter for preprocedural laboratory examination: Secondary | ICD-10-CM | POA: Diagnosis present

## 2020-07-09 NOTE — Progress Notes (Signed)
[facility] Perioperative Services: Pre-Admission/Anesthesia Testing     Date: 07/09/20  Name: Kevin Alexander MRN:   [id number]  Re: Cardiac clearance for surgery   Case: [id number] Date/Time: 07/12/20 0700   Procedure: THORACIC SPINAL CORD STIMULATOR, PULSE GENERATOR (N/A )   Anesthesia type: General   Pre-op diagnosis: chronic pain syndrome   Location: [facility] OR ROOM 03 / [place] ORS FOR ANESTHESIA GROUP   Surgeons: Deetta Perla, MD    Patient is scheduled for the above procedure on 07/12/2020 with Dr. Deetta Perla. Patient initially scheduled back in 04/2020, however needed to be seen for preoperative cardiac evaluation and clearance.   Patient was seen by cardiology on 07/07/2020 by Dr. Serafina Royals; notes reviewed. At the time of his clinic visit, patient appeared to be stable from a cardiovascular perspective. He denied chest pain, however did complain of some exertional shortness of breath related to his known emphysema. Patient with intermittent episodes of palpitations associated with activity that last for about 2 minutes at a time. He occasionally becomes lightheaded when he is hungry. Patient did experience a syncopal episode approximately 3 months ago that was associated with nausea and vomiting. Cardiologist noted that this has been discussed with his PCP who felt that this was related to antihypertensives; medications adjusted beta-blocker was discontinued back in 04/2020, and patient reports that he has not experienced any further syncopal episodes. Patient has been off of his antiplatelet (low-dose ASA) since 04/2020 in anticipation of his neurosurgical procedure; has not resumed. Lifestyle predominantly sedentary secondary to peripheral neuropathy. Recent TTE demonstrated normal left ventricular systolic function with mild valvular insufficiency. Myocardial perfusion imaging study revealed an LVEF of 58% with no evidence of stress-induced myocardial ischemia or  arrhythmia (see full interpretation of cardiovascular testing below). Blood pressure documented as being low in the clinic at 104/66; no longer on antihypertensives. Patient is on a statin for his HLD. No changes were made to his medication regimen. Patient to follow up with outpatient cardiology in 4 months.   Per cardiology, "this procedure appears to be non emergent moderate risk surgery without evident cardiac contraindication. Patient does not exhibit any unstable/severe angina, no recent myocardial infarction, no decompensated/worsening/new onset or class IV heart failure, no high-grade AV block, no symptomatic arrhythmias noted, no new ventricular tachycardia, no severe aortic stenosis or symptomatic mitral stenosis. The patient is at lowest risk possible for cardiovascular sequela related to surgery and is safe to proceed with surgery as scheduled".  Impression and Plan:  Kevin Alexander has been referred for pre-anesthesia review and clearance prior to him undergoing the planned anesthetic and procedural courses. Available labs, pertinent testing, and imaging results were personally reviewed by me. This patient has been appropriately cleared by cardiology.  Patient has HYPOkalemia (K+ 2.8 mmol/L) noted on PAT labs. Patient chronically taken oral KDUR 20 mEq daily. Dose increased to 20 mEq BID on 07/07/2020 until day of surgery when his K+ will be rechecked to ensure correction. With that being said, based on clinical review performed today (07/09/20), barring any significant acute changes in the patient's overall condition, it is anticipated that he will be able to proceed with the planned surgical intervention. Any acute changes in clinical condition may necessitate his procedure being postponed and/or cancelled. Pre-surgical instructions were reviewed with the patient during his PAT appointment and questions were fielded by PAT clinical staff.  Honor Loh, MSN, APRN, FNP-C, CEN [place]  Peri-operative Services Nurse Practitioner Phone: [phone]  [phone] 07/09/20 9:37 AM  NOTE: This note has been prepared using [employer]. Despite my best ability to proofread, there is always the potential that unintentional transcriptional errors may still occur from this process.

## 2020-07-10 LAB — SARS CORONAVIRUS 2 (TAT 6-24 HRS): SARS Coronavirus 2: NEGATIVE

## 2020-07-12 ENCOUNTER — Other Ambulatory Visit: Payer: Self-pay

## 2020-07-12 ENCOUNTER — Ambulatory Visit: Payer: Medicare Other

## 2020-07-12 ENCOUNTER — Ambulatory Visit: Payer: Medicare Other | Admitting: Urgent Care

## 2020-07-12 ENCOUNTER — Encounter
Admission: RE | Disposition: A | Discharge status: Home / Self Care | Payer: Self-pay | Source: Home / Self Care | Attending: Neurosurgery

## 2020-07-12 ENCOUNTER — Encounter: Payer: Self-pay | Admitting: Neurosurgery

## 2020-07-12 ENCOUNTER — Ambulatory Visit
Admission: RE | Admit: 2020-07-12 | Discharge: 2020-07-12 | Disposition: A | Discharge status: Home / Self Care | Payer: Medicare Other | Attending: Neurosurgery | Admitting: Neurosurgery

## 2020-07-12 ENCOUNTER — Other Ambulatory Visit: Payer: Self-pay | Admitting: Neurosurgery

## 2020-07-12 DIAGNOSIS — I25119 Atherosclerotic heart disease of native coronary artery with unspecified angina pectoris: Secondary | ICD-10-CM | POA: Diagnosis not present

## 2020-07-12 DIAGNOSIS — Z79899 Other long term (current) drug therapy: Secondary | ICD-10-CM | POA: Insufficient documentation

## 2020-07-12 DIAGNOSIS — J439 Emphysema, unspecified: Secondary | ICD-10-CM | POA: Diagnosis not present

## 2020-07-12 DIAGNOSIS — Z888 Allergy status to other drugs, medicaments and biological substances status: Secondary | ICD-10-CM | POA: Insufficient documentation

## 2020-07-12 DIAGNOSIS — Z883 Allergy status to other anti-infective agents status: Secondary | ICD-10-CM | POA: Insufficient documentation

## 2020-07-12 DIAGNOSIS — F17210 Nicotine dependence, cigarettes, uncomplicated: Secondary | ICD-10-CM | POA: Diagnosis not present

## 2020-07-12 DIAGNOSIS — Z8249 Family history of ischemic heart disease and other diseases of the circulatory system: Secondary | ICD-10-CM | POA: Insufficient documentation

## 2020-07-12 DIAGNOSIS — G2581 Restless legs syndrome: Secondary | ICD-10-CM | POA: Insufficient documentation

## 2020-07-12 DIAGNOSIS — Z87442 Personal history of urinary calculi: Secondary | ICD-10-CM | POA: Insufficient documentation

## 2020-07-12 DIAGNOSIS — Z801 Family history of malignant neoplasm of trachea, bronchus and lung: Secondary | ICD-10-CM | POA: Diagnosis not present

## 2020-07-12 DIAGNOSIS — G894 Chronic pain syndrome: Secondary | ICD-10-CM | POA: Diagnosis present

## 2020-07-12 DIAGNOSIS — Z823 Family history of stroke: Secondary | ICD-10-CM | POA: Diagnosis not present

## 2020-07-12 DIAGNOSIS — I252 Old myocardial infarction: Secondary | ICD-10-CM | POA: Diagnosis not present

## 2020-07-12 DIAGNOSIS — Z419 Encounter for procedure for purposes other than remedying health state, unspecified: Secondary | ICD-10-CM

## 2020-07-12 HISTORY — PX: SPINAL CORD STIMULATOR INSERTION: SHX5378

## 2020-07-12 LAB — URINE DRUG SCREEN, QUALITATIVE (ARMC ONLY)
Amphetamines, Ur Screen: NOT DETECTED
Barbiturates, Ur Screen: NOT DETECTED
Benzodiazepine, Ur Scrn: NOT DETECTED
Cannabinoid 50 Ng, Ur ~~LOC~~: POSITIVE — AB
Cocaine Metabolite,Ur ~~LOC~~: NOT DETECTED
MDMA (Ecstasy)Ur Screen: NOT DETECTED
Methadone Scn, Ur: NOT DETECTED
Opiate, Ur Screen: NOT DETECTED
Phencyclidine (PCP) Ur S: NOT DETECTED
Tricyclic, Ur Screen: NOT DETECTED

## 2020-07-12 LAB — POCT I-STAT, CHEM 8
BUN: 7 mg/dL (ref 6–20)
Calcium, Ion: 1.19 mmol/L (ref 1.15–1.40)
Chloride: 110 mmol/L (ref 98–111)
Creatinine, Ser: 1.5 mg/dL — ABNORMAL HIGH (ref 0.61–1.24)
Glucose, Bld: 101 mg/dL — ABNORMAL HIGH (ref 70–99)
HCT: 40 % (ref 39.0–52.0)
Hemoglobin: 13.6 g/dL (ref 13.0–17.0)
Potassium: 3.9 mmol/L (ref 3.5–5.1)
Sodium: 141 mmol/L (ref 135–145)
TCO2: 19 mmol/L — ABNORMAL LOW (ref 22–32)

## 2020-07-12 SURGERY — INSERTION, SPINAL CORD STIMULATOR, CERVICAL
Anesthesia: General

## 2020-07-12 MED ORDER — PROPOFOL 500 MG/50ML IV EMUL
INTRAVENOUS | Status: DC | PRN
  Administered 2020-07-12: 125 ug/kg/min via INTRAVENOUS

## 2020-07-12 MED ORDER — PROPOFOL 10 MG/ML IV BOLUS
INTRAVENOUS | Status: AC
  Filled 2020-07-12: qty 40

## 2020-07-12 MED ORDER — KETAMINE HCL 50 MG/5ML IJ SOSY
PREFILLED_SYRINGE | INTRAMUSCULAR | Status: AC
  Filled 2020-07-12: qty 5

## 2020-07-12 MED ORDER — OXYCODONE HCL 5 MG/5ML PO SOLN: 5.0000 mg | Freq: Once | ORAL | Status: AC | PRN

## 2020-07-12 MED ORDER — BUPIVACAINE-EPINEPHRINE (PF) 0.5% -1:200000 IJ SOLN
INTRAMUSCULAR | Status: DC | PRN
  Administered 2020-07-12: 11 mL

## 2020-07-12 MED ORDER — VANCOMYCIN HCL 1000 MG IV SOLR
INTRAVENOUS | Status: AC
  Filled 2020-07-12: qty 1000

## 2020-07-12 MED ORDER — SODIUM CHLORIDE 0.9 % IV SOLN
INTRAVENOUS | Status: DC | PRN
  Administered 2020-07-12: 35 ug/min via INTRAVENOUS

## 2020-07-12 MED ORDER — OXYCODONE HCL 5 MG PO TABS
ORAL_TABLET | ORAL | Status: AC
  Filled 2020-07-12: qty 1

## 2020-07-12 MED ORDER — PHENYLEPHRINE HCL (PRESSORS) 10 MG/ML IV SOLN
INTRAVENOUS | Status: AC
  Filled 2020-07-12: qty 1

## 2020-07-12 MED ORDER — EPINEPHRINE PF 1 MG/ML IJ SOLN
INTRAMUSCULAR | Status: AC
  Filled 2020-07-12: qty 1

## 2020-07-12 MED ORDER — PROPOFOL 500 MG/50ML IV EMUL: INTRAVENOUS | Status: DC | PRN

## 2020-07-12 MED ORDER — PROPOFOL 10 MG/ML IV BOLUS
INTRAVENOUS | Status: DC | PRN
  Administered 2020-07-12: 150 mg via INTRAVENOUS
  Administered 2020-07-12: 50 mg via INTRAVENOUS

## 2020-07-12 MED ORDER — CEFAZOLIN SODIUM-DEXTROSE 2-4 GM/100ML-% IV SOLN
INTRAVENOUS | Status: AC
  Filled 2020-07-12: qty 100

## 2020-07-12 MED ORDER — ONDANSETRON HCL 4 MG/2ML IJ SOLN
INTRAMUSCULAR | Status: AC
  Filled 2020-07-12: qty 2

## 2020-07-12 MED ORDER — FENTANYL CITRATE (PF) 100 MCG/2ML IJ SOLN
INTRAMUSCULAR | Status: DC | PRN
  Administered 2020-07-12: 50 ug via INTRAVENOUS

## 2020-07-12 MED ORDER — EPHEDRINE 5 MG/ML INJ
INTRAVENOUS | Status: AC
  Filled 2020-07-12: qty 10

## 2020-07-12 MED ORDER — THROMBIN 5000 UNITS EX SOLR
CUTANEOUS | Status: AC
  Filled 2020-07-12: qty 5000

## 2020-07-12 MED ORDER — FENTANYL CITRATE (PF) 100 MCG/2ML IJ SOLN
INTRAMUSCULAR | Status: AC
  Filled 2020-07-12: qty 2

## 2020-07-12 MED ORDER — LACTATED RINGERS IV SOLN: INTRAVENOUS | Status: DC

## 2020-07-12 MED ORDER — ROCURONIUM BROMIDE 10 MG/ML (PF) SYRINGE
PREFILLED_SYRINGE | INTRAVENOUS | Status: AC
  Filled 2020-07-12: qty 10

## 2020-07-12 MED ORDER — ACETAMINOPHEN 10 MG/ML IV SOLN
INTRAVENOUS | Status: AC
  Filled 2020-07-12: qty 100

## 2020-07-12 MED ORDER — BUPIVACAINE HCL (PF) 0.5 % IJ SOLN
INTRAMUSCULAR | Status: AC
  Filled 2020-07-12: qty 30

## 2020-07-12 MED ORDER — OXYCODONE HCL 5 MG PO TABS
5.0000 mg | ORAL_TABLET | Freq: Once | ORAL | Status: AC | PRN
  Administered 2020-07-12: 5 mg via ORAL

## 2020-07-12 MED ORDER — REMIFENTANIL HCL 1 MG IV SOLR
INTRAVENOUS | Status: AC
  Filled 2020-07-12: qty 1000

## 2020-07-12 MED ORDER — CHLORHEXIDINE GLUCONATE 0.12 % MT SOLN
OROMUCOSAL | Status: AC
  Administered 2020-07-12: 15 mL
  Filled 2020-07-12: qty 15

## 2020-07-12 MED ORDER — SUCCINYLCHOLINE CHLORIDE 200 MG/10ML IV SOSY
PREFILLED_SYRINGE | INTRAVENOUS | Status: AC
  Filled 2020-07-12: qty 10

## 2020-07-12 MED ORDER — MIDAZOLAM HCL 2 MG/2ML IJ SOLN
INTRAMUSCULAR | Status: DC | PRN
  Administered 2020-07-12: 2 mg via INTRAVENOUS

## 2020-07-12 MED ORDER — EPHEDRINE SULFATE 50 MG/ML IJ SOLN
INTRAMUSCULAR | Status: DC | PRN
  Administered 2020-07-12: 10 mg via INTRAVENOUS

## 2020-07-12 MED ORDER — MIDAZOLAM HCL 2 MG/2ML IJ SOLN
INTRAMUSCULAR | Status: AC
  Filled 2020-07-12: qty 2

## 2020-07-12 MED ORDER — CHLORHEXIDINE GLUCONATE 0.12 % MT SOLN
15.0000 mL | Freq: Once | OROMUCOSAL | Status: AC
  Administered 2020-07-12: 15 mL via OROMUCOSAL

## 2020-07-12 MED ORDER — HYDROCODONE-ACETAMINOPHEN 5-325 MG PO TABS: 1.0000 | ORAL_TABLET | ORAL | 0 refills | Status: DC | PRN

## 2020-07-12 MED ORDER — REMIFENTANIL HCL 1 MG IV SOLR
INTRAVENOUS | Status: DC | PRN
  Administered 2020-07-12: .08 ug/kg/min via INTRAVENOUS

## 2020-07-12 MED ORDER — FENTANYL CITRATE (PF) 100 MCG/2ML IJ SOLN: 25.0000 ug | INTRAMUSCULAR | Status: DC | PRN

## 2020-07-12 MED ORDER — ACETAMINOPHEN 10 MG/ML IV SOLN
1000.0000 mg | Freq: Once | INTRAVENOUS | Status: DC | PRN
  Administered 2020-07-12: 1000 mg via INTRAVENOUS

## 2020-07-12 MED ORDER — DEXAMETHASONE SODIUM PHOSPHATE 10 MG/ML IJ SOLN
INTRAMUSCULAR | Status: AC
  Filled 2020-07-12: qty 1

## 2020-07-12 MED ORDER — SUCCINYLCHOLINE CHLORIDE 20 MG/ML IJ SOLN
INTRAMUSCULAR | Status: DC | PRN
  Administered 2020-07-12: 100 mg via INTRAVENOUS

## 2020-07-12 MED ORDER — KETAMINE HCL 10 MG/ML IJ SOLN
INTRAMUSCULAR | Status: DC | PRN
  Administered 2020-07-12: 25 mg via INTRAVENOUS

## 2020-07-12 MED ORDER — ORAL CARE MOUTH RINSE: 15.0000 mL | Freq: Once | OROMUCOSAL | Status: AC

## 2020-07-12 MED ORDER — VANCOMYCIN HCL 1000 MG IV SOLR
INTRAVENOUS | Status: DC | PRN
  Administered 2020-07-12: 1000 mg via TOPICAL

## 2020-07-12 MED ORDER — LIDOCAINE HCL (PF) 2 % IJ SOLN
INTRAMUSCULAR | Status: AC
  Filled 2020-07-12: qty 5

## 2020-07-12 MED ORDER — REMIFENTANIL HCL 1 MG IV SOLR
INTRAVENOUS | Status: DC | PRN
  Administered 2020-07-12: 100 ug via INTRAVENOUS

## 2020-07-12 MED ORDER — ONDANSETRON HCL 4 MG/2ML IJ SOLN
INTRAMUSCULAR | Status: DC | PRN
  Administered 2020-07-12: 4 mg via INTRAVENOUS

## 2020-07-12 MED ORDER — CEFAZOLIN SODIUM-DEXTROSE 2-4 GM/100ML-% IV SOLN
2.0000 g | INTRAVENOUS | Status: AC
  Administered 2020-07-12: 2 g via INTRAVENOUS

## 2020-07-12 MED ORDER — ONDANSETRON HCL 4 MG/2ML IJ SOLN: 4.0000 mg | Freq: Once | INTRAMUSCULAR | Status: DC | PRN

## 2020-07-12 MED ORDER — ALBUTEROL SULFATE HFA 108 (90 BASE) MCG/ACT IN AERS
INHALATION_SPRAY | RESPIRATORY_TRACT | Status: AC
  Filled 2020-07-12: qty 6.7

## 2020-07-12 MED ORDER — PROPOFOL 500 MG/50ML IV EMUL
INTRAVENOUS | Status: AC
  Filled 2020-07-12: qty 50

## 2020-07-12 MED ORDER — DEXAMETHASONE SODIUM PHOSPHATE 10 MG/ML IJ SOLN
INTRAMUSCULAR | Status: DC | PRN
  Administered 2020-07-12: 5 mg via INTRAVENOUS

## 2020-07-12 SURGICAL SUPPLY — 83 items
ADH SKN CLS APL DERMABOND .7 (GAUZE/BANDAGES/DRESSINGS) ×1
AGENT HMST MTR 8 SURGIFLO (HEMOSTASIS)
ANCH LD 4 SETX2 CLIK X (Stimulator) ×1 IMPLANT
ANCHOR CLIK X NEURO (Stimulator) ×1 IMPLANT
APL PRP STRL LF DISP 70% ISPRP (MISCELLANEOUS) ×1
APL SRG 60D 8 XTD TIP BNDBL (TIP)
BLADE BOVIE TIP EXT 4 (BLADE) ×2 IMPLANT
BUR NEURO DRILL SOFT 3.0X3.8M (BURR) ×2 IMPLANT
CABLE OR STIMULATOR 2X8 61 (WIRE) ×1 IMPLANT
CHLORAPREP W/TINT 26 (MISCELLANEOUS) ×2 IMPLANT
CNTNR SPEC 2.5X3XGRAD LEK (MISCELLANEOUS) ×1
CONT SPEC 4OZ STER OR WHT (MISCELLANEOUS) ×1
CONT SPEC 4OZ STRL OR WHT (MISCELLANEOUS) ×1
CONTAINER SPEC 2.5X3XGRAD LEK (MISCELLANEOUS) ×1 IMPLANT
CONTROL REMOTE FREELINK ALPHA (NEUROSURGERY SUPPLIES) ×1 IMPLANT
COUNTER NEEDLE 20/40 LG (NEEDLE) ×2 IMPLANT
COVER LIGHT HANDLE STERIS (MISCELLANEOUS) ×4 IMPLANT
COVER WAND RF STERILE (DRAPES) ×2 IMPLANT
CUP MEDICINE 2OZ PLAST GRAD ST (MISCELLANEOUS) ×2 IMPLANT
DERMABOND ADVANCED (GAUZE/BANDAGES/DRESSINGS) ×1
DERMABOND ADVANCED .7 DNX12 (GAUZE/BANDAGES/DRESSINGS) ×1 IMPLANT
DRAPE C-ARM 42X72 X-RAY (DRAPES) ×4 IMPLANT
DRAPE C-ARMOR (DRAPES) IMPLANT
DRAPE LAPAROTOMY 100X77 ABD (DRAPES) ×2 IMPLANT
DRAPE MICROSCOPE SPINE 48X150 (DRAPES) IMPLANT
DRAPE SURG 17X11 SM STRL (DRAPES) ×2 IMPLANT
DRSG TEGADERM 4X4.75 (GAUZE/BANDAGES/DRESSINGS) ×2 IMPLANT
DURASEAL APPLICATOR TIP (TIP) IMPLANT
DURASEAL SPINE SEALANT 3ML (MISCELLANEOUS) IMPLANT
ELECT CAUTERY BLADE TIP 2.5 (TIP) ×2
ELECT EZSTD 165MM 6.5IN (MISCELLANEOUS) ×2
ELECT REM PT RETURN 9FT ADLT (ELECTROSURGICAL) ×2
ELECTRODE CAUTERY BLDE TIP 2.5 (TIP) ×1 IMPLANT
ELECTRODE EZSTD 165MM 6.5IN (MISCELLANEOUS) ×1 IMPLANT
ELECTRODE REM PT RTRN 9FT ADLT (ELECTROSURGICAL) ×1 IMPLANT
FEE INTRAOP MONITOR IMPULS NCS (MISCELLANEOUS) IMPLANT
GAUZE SPONGE 4X4 12PLY STRL (GAUZE/BANDAGES/DRESSINGS) IMPLANT
GLOVE SRG 8 PF TXTR STRL LF DI (GLOVE) ×1 IMPLANT
GLOVE SURG SYN 7.0 (GLOVE) ×4 IMPLANT
GLOVE SURG SYN 7.0 PF PI (GLOVE) ×2 IMPLANT
GLOVE SURG SYN 8.0 (GLOVE) ×2 IMPLANT
GLOVE SURG SYN 8.0 PF PI (GLOVE) ×1 IMPLANT
GLOVE SURG UNDER POLY LF SZ7 (GLOVE) ×2 IMPLANT
GLOVE SURG UNDER POLY LF SZ8 (GLOVE) ×2
GOWN STRL REUS W/ TWL LRG LVL3 (GOWN DISPOSABLE) ×1 IMPLANT
GOWN STRL REUS W/ TWL XL LVL3 (GOWN DISPOSABLE) ×2 IMPLANT
GOWN STRL REUS W/TWL LRG LVL3 (GOWN DISPOSABLE) ×2
GOWN STRL REUS W/TWL XL LVL3 (GOWN DISPOSABLE) ×4
GRADUATE 1200CC STRL 31836 (MISCELLANEOUS) ×2 IMPLANT
GRAFT DURAGEN MATRIX 1WX1L (Tissue) IMPLANT
INTRAOP MONITOR FEE IMPULS NCS (MISCELLANEOUS) ×1
INTRAOP MONITOR FEE IMPULSE (MISCELLANEOUS) ×2
KIT CHARGING (KITS) ×1
KIT CHARGING PRECISION NEURO (KITS) IMPLANT
KIT IPG ALPHA WAVEWRITER (Stimulator) ×1 IMPLANT
KIT LEAD 50CM (Lead) ×2 IMPLANT
KIT TURNOVER KIT A (KITS) ×2 IMPLANT
KIT WILSON FRAME (KITS) ×2 IMPLANT
MANIFOLD NEPTUNE II (INSTRUMENTS) ×2 IMPLANT
MARKER SKIN DUAL TIP RULER LAB (MISCELLANEOUS) ×2 IMPLANT
NDL SAFETY ECLIPSE 18X1.5 (NEEDLE) ×1 IMPLANT
NEEDLE HYPO 18GX1.5 SHARP (NEEDLE) ×2
NEEDLE HYPO 22GX1.5 SAFETY (NEEDLE) ×2 IMPLANT
NS IRRIG 1000ML POUR BTL (IV SOLUTION) ×2 IMPLANT
PACK LAMINECTOMY NEURO (CUSTOM PROCEDURE TRAY) ×2 IMPLANT
PAD ARMBOARD 7.5X6 YLW CONV (MISCELLANEOUS) ×2 IMPLANT
PAD TELFA 2X3 NADH STRL (GAUZE/BANDAGES/DRESSINGS) ×2 IMPLANT
SPOGE SURGIFLO 8M (HEMOSTASIS)
SPONGE SURGIFLO 8M (HEMOSTASIS) IMPLANT
STAPLER SKIN PROX 35W (STAPLE) IMPLANT
SUT ETHILON 3-0 FS-10 30 BLK (SUTURE) ×4
SUT POLYSORB 2-0 5X18 GS-10 (SUTURE) ×7 IMPLANT
SUT SILK 2 0 SH (SUTURE) ×4 IMPLANT
SUT VIC AB 0 CT1 18XCR BRD 8 (SUTURE) ×2 IMPLANT
SUT VIC AB 0 CT1 8-18 (SUTURE) ×4
SUTURE EHLN 3-0 FS-10 30 BLK (SUTURE) ×2 IMPLANT
SYR 10ML LL (SYRINGE) ×4 IMPLANT
SYR 20ML LL LF (SYRINGE) ×2 IMPLANT
SYR 30ML LL (SYRINGE) ×4 IMPLANT
SYR 3ML LL SCALE MARK (SYRINGE) ×2 IMPLANT
TOOL LONG TUNNEL (SPINAL CORD STIMULATOR) ×1 IMPLANT
TOWEL OR 17X26 4PK STRL BLUE (TOWEL DISPOSABLE) ×4 IMPLANT
TUBING CONNECTING 10 (TUBING) ×2 IMPLANT

## 2020-07-12 NOTE — Anesthesia Procedure Notes (Signed)
Procedure Name: Intubation Date/Time: 07/12/2020 7:25 AM Performed by: Katy Apo, RN Pre-anesthesia Checklist: Patient identified, Emergency Drugs available, Suction available and Patient being monitored Patient Re-evaluated:Patient Re-evaluated prior to induction Oxygen Delivery Method: Circle system utilized Preoxygenation: Pre-oxygenation with 100% oxygen Induction Type: IV induction Ventilation: Mask ventilation without difficulty Laryngoscope Size: Mac and 4 Grade View: Grade I Tube type: Oral Tube size: 7.5 mm Number of attempts: 2 Airway Equipment and Method: Stylet and Oral airway Placement Confirmation: ETT inserted through vocal cords under direct vision,  positive ETCO2 and breath sounds checked- equal and bilateral Secured at: 22 cm Tube secured with: Tape Dental Injury: Teeth and Oropharynx as per pre-operative assessment

## 2020-07-12 NOTE — Anesthesia Postprocedure Evaluation (Signed)
Anesthesia Post Note  Patient: Kevin Alexander  Procedure(s) Performed: THORACIC SPINAL CORD STIMULATOR, PULSE GENERATOR (N/A )  Patient location during evaluation: PACU Anesthesia Type: General Level of consciousness: awake and alert Pain management: pain level controlled Vital Signs Assessment: post-procedure vital signs reviewed and stable Respiratory status: spontaneous breathing, nonlabored ventilation, respiratory function stable and patient connected to nasal cannula oxygen Cardiovascular status: blood pressure returned to baseline and stable Postop Assessment: no apparent nausea or vomiting Anesthetic complications: no   No complications documented.   Last Vitals:  Vitals:   07/12/20 0949 07/12/20 0953  BP: 121/77 115/72  Pulse: 60 66  Resp: 16 (!) 26  Temp: (!) 36.2 C   SpO2: 99% 99%    Last Pain:  Vitals:   07/12/20 0949  TempSrc:   PainSc: 1                  Arita Miss
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
## 2020-07-12 NOTE — Discharge Instructions (Addendum)
NEUROSURGERY DISCHARGE INSTRUCTIONS  Admission diagnosis: chronic pain syndrome  Operative procedure: Thoracic Spinal Cord Stimulator with Left Flank Pulse generator  What to do after you leave the hospital:  Recommended diet: regular diet. Increase protein intake to promote wound healing.  Recommended activity: no lifting, pushing, pulling, or strenuous exercise for 4 weeks. You should be walking daily  Special Instructions  No straining, no heavy lifting > 10lbs x 4 weeks.  Keep incision areas clean and dry.  May Shower in 2 days. PLEASE REMOVE DRESSINGS IN 2 DAYS.  No bath or soaking in tub for6weeks. You have absorbable sutures. Nothing to remove.  You may take pain medication as needed, please take a stool softener with these. You may restart your Aspirin in 7 days.   Hydrocodone has been placed electronically at the pharmacy  Please Report any of the following: Nausea or Vomiting, Temperature is greater than 101.54F (38.1C) degrees, Difficulty Breathing or Shortness of Breath, Inability to Eat, drink Fluids, or Take medications, Bleeding, swelling, or drainage from surgical incision sites, New numbness or weakness and Bowel or bladder dysfunction to the neurosurgeon on call at [phone]  Additional Follow up appointments You have follow up in [place] clinic on 07/27/2020   [place]   1) The drugs that you were given will stay in your system until tomorrow so for the next 24 hours you should not:  A) Drive an automobile B) Make any legal decisions C) Drink any alcoholic beverage   2) You may resume regular meals tomorrow.  Today it is better to start with liquids and gradually work up to solid foods.  You may eat anything you prefer, but it is better to start with liquids, then soup and crackers, and gradually work up to solid foods.   3) Please notify your doctor immediately if you have any unusual bleeding, trouble breathing,  redness and pain at the surgery site, drainage, fever, or pain not relieved by medication.    Additional Instructions:  Per MD instructions, pain med script has been sent to your pharmacy.  You had pain med (oxycodone) at the hospital at                 9:42 am.  You had tylenol 1,000 mg at the hospital at 9:42 am.  WEAR YOUR BACK BRACE FOR 4 WEEKS PER DR. COOK.       Please contact your physician with any problems or Same Day Surgery at [phone], Monday through Friday 6 am to 4 pm, or [place] at [facility] number at [phone].

## 2020-07-12 NOTE — Op Note (Signed)
Operative Note   SURGERY DATE:  07/12/2020   PRE-OP DIAGNOSIS:  Chronic pain syndrome   POST-OP DIAGNOSIS: Post-Op Diagnosis Codes: Chronic pain syndrome   Procedure(s) with comments: Thoracic Percutaneous SCS Leads Left flank pulse generator placement  SURGEON:     Malen Gauze, MD          ANESTHESIA: General    OPERATIVE FINDINGS: Successful placement of thoracic spinal cord stimulator and pulse generator   Indication Kevin Alexander was seen in clinic on 11/9 after having a successful thoracic SCS trial for foot pain. MRI of the spine revealed no concerning stenosis at the level of the implant. The patient wished to proceed to permanent implant to decrease medication use and achieve better pain control. Risks including damage to spinal cord, weakness, hematoma, infection, failure of pain relief, post-operative pain, need for revision, stroke, heart attack, pneumonia, and spinal cord injury were discussed. He was cleared by cardiology to proceed.    Procedure The patient was brought to the operating room where vascular access was obtained and he was intubated by the anesthesia service.  Neuro monitoring electrodes were placed and baseline MEPs and SSEPs were normal.  He was placed prone on gel rolls. Antibiotics were given. Fluoroscopy was used to confirm planned incision in lumbar area at the level of L3/4 in the midline.  An addition incision was planned in the right flank for the pulse generator placement.  The patient was prepped and draped in a sterile fashion. A hard time out was performed. Local anesthetic was instilled into planned incision sites.    The midline lumbar incision was opened and taken to the fascia using cautery.Next, a Touhy needle was used to insert in a paramedian approach to enter the interlaminar space between L3 and L2.  Once loss of resistance was identified, this was confirmed with a metal stylette.  Next, the percutaneous lead was passed in the rostral  direction using fluoroscopy as guidance to keep in the midline.  This was passed without resistance to the level of the T12 vertebral body on the left.  Lateral views were obtained to confirm we were in the dorsal epidural space.  Next, the same procedure was performed contralaterally. We placed an additional lead on the right at T12, slightly offset to the previous lead to allow for better coverage. The leads were secured with anchors in the fascia.  The incision was irrigated and hemostasis obtained.    Next, the left flank incision was opened and taken to the fascia and inferior dissection to allow for a large enough pocket for the placement of the battery.  The incision was irrigated and hemostasis obtained.  Next, a tunneler was used to pass the electrode leads from the lumbar incision to the left flank.  There, the electrodes were attached to the pulse generator and impedances were found to be normal.  Monitoring confirmed stimulation in lower extremities. The pulse generator was placed into the incision taking care to place the wires beneath the pulse generator.    A final fluoroscopic image was taken show good placement of percutaneous leads. MEPs and SSEPS remained stable throughout the case. Vancomycin powder was placed into the incisions.  Next, a combination of 2-0  Vicryls were used to close the incisions with Dermabond on the skin in flank and  lumbar midline.   Sterile dressings were applied. The patient was returned to supine position and the patient was seen to be moving all extremities symmetrically and was  taken to PACU for recovery. The family was updated and all questions answered.    ESTIMATED BLOOD LOSS:   10 cc   SPECIMENS None   IMPLANT Lauraine Rinne - [id number]  Inventory Item: Marylouise Stacks X Serial no.:  Model/Cat no.: [id number]  Implant name: Lauraine Rinne [id number] Laterality: N/A Area: Spine Thoracic  Manufacturer: Wilkie Aye Date of Manufacture:    Action:  Implanted Number Used: 1   Device Identifier:  Device Identifier Type:     KIT LEAD 50CM - [id number]  Inventory Item: KIT LEAD 50CM Serial no.: [id number] Model/Cat no.: [id number]  Implant name: KIT LEAD 50CM - [id number] Laterality: N/A Area: Spine Thoracic  Manufacturer: [employer] Date of Manufacture:    Action: Implanted Number Used: 1   Device Identifier:  Device Identifier Type:     KIT LEAD 50CM - [id number]  Inventory Item: KIT LEAD 50CM Serial no.: [id number] Model/Cat no.: [id number]  Implant name: KIT LEAD 50CM - [id number] Laterality: N/A Area: Spine Thoracic  Manufacturer: [employer] Date of Manufacture:    Action: Implanted Number Used: 1   Device Identifier:  Device Identifier Type:     KIT IPG ALPHA WAVEWRITER - [id number]  Inventory Item: KIT IPG ALPHA WAVEWRITER Serial no.: [id number] Model/Cat no.: [id number]  Implant name: KIT IPG ALPHA WAVEWRITER - [id number] Laterality: N/A Area: Spine Thoracic  Manufacturer: Wilkie Aye Date of Manufacture:    Action: Implanted Number Used: 1   Device Identifier:  Device Identifier Type:          I performed the case in its entirety with Kevin Ernst Bre[place], [place]   Deetta Perla, [place]

## 2020-07-12 NOTE — Interval H&P Note (Signed)
History and Physical Interval Note:  07/12/2020 6:44 AM  Kevin Alexander  has presented today for surgery, with the diagnosis of chronic pain syndrome.  The various methods of treatment have been discussed with the patient and family. After consideration of risks, benefits and other options for treatment, the patient has consented to  Procedure(s): [place], PULSE GENERATOR (N/A) as a surgical intervention.  The patient's history has been reviewed, patient examined, no change in status, stable for surgery.  I have reviewed the patient's chart and labs.  Questions were answered to the patient's satisfaction.     Deetta Perla

## 2020-07-12 NOTE — OR Nursing (Signed)
Per Dr. Lacinda Axon, telephone, pt to wear back brace for 4 weeks.  (Pt brought to hospital with him, applied postop); pt aware of same. - Added to d/c instructions.

## 2020-07-12 NOTE — Transfer of Care (Signed)
Immediate Anesthesia Transfer of Care Note  Patient: Kevin Alexander  Procedure(s) Performed: THORACIC SPINAL CORD STIMULATOR, PULSE GENERATOR (N/A )  Patient Location: PACU  Anesthesia Type:General  Level of Consciousness: awake and alert   Airway & Oxygen Therapy: Patient Spontanous Breathing and Patient connected to face mask oxygen  Post-op Assessment: Report given to RN and Post -op Vital signs reviewed and stable  Post vital signs: Reviewed and stable  Last Vitals:  Vitals Value Taken Time  BP 111/64 07/12/20 0907  Temp    Pulse 81 07/12/20 0909  Resp 17 07/12/20 0906  SpO2 100 % 07/12/20 0909  Vitals shown include unvalidated device data.  Last Pain:  Vitals:   07/12/20 0616  TempSrc: Oral  PainSc: 0-No pain         Complications: No complications documented.
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
## 2020-07-12 NOTE — Addendum Note (Signed)
Addended by: Deetta Perla on: 07/12/2020 01:17 PM   Modules accepted: Orders

## 2020-07-13 ENCOUNTER — Encounter: Payer: Self-pay | Admitting: Neurosurgery

## 2020-08-09 ENCOUNTER — Other Ambulatory Visit: Payer: Self-pay | Admitting: Nephrology

## 2020-08-09 DIAGNOSIS — N1831 Chronic kidney disease, stage 3a: Secondary | ICD-10-CM

## 2020-08-17 ENCOUNTER — Other Ambulatory Visit: Payer: Self-pay | Admitting: Neurosurgery

## 2020-08-19 ENCOUNTER — Other Ambulatory Visit: Admission: RE | Admit: 2020-08-19 | Payer: Medicare Other | Source: Ambulatory Visit

## 2020-08-19 NOTE — Patient Instructions (Addendum)
Your procedure is scheduled on: Monday August 23, 2020. Report to Day Surgery inside [facility] 2nd floor (stop by Admissions desk first before getting on elevator) To find out your arrival time please call [phone] between 1PM - 3PM on Friday August 20, 2020.  Remember: Instructions that are not followed completely may result in serious medical risk,  up to and including death, or upon the discretion of your surgeon and anesthesiologist your  surgery may need to be rescheduled.     _X__ 1. Do not eat food after midnight the night before your procedure.                 No chewing gum or hard candies. You may drink clear liquids up to 2 hours                 before you are scheduled to arrive for your surgery- DO not drink clear                 liquids within 2 hours of the start of your surgery.                 Clear Liquids include:  water, apple juice without pulp, clear Gatorade, G2 or                  Gatorade Zero (avoid Red/Purple/Blue), Black Coffee or Tea (Do not add                 anything to coffee or tea).  __X__2.  On the morning of surgery brush your teeth with toothpaste and water, you                may rinse your mouth with mouthwash if you wish.  Do not swallow any toothpaste of mouthwash.     _X__ 3.  No Alcohol for 24 hours before or after surgery.   _X__ 4.  Do Not Smoke or use e-cigarettes For 24 Hours Prior to Your Surgery.                 Do not use any chewable tobacco products for at least 6 hours prior to                 Surgery.  _X__  5.  Do not use any recreational drugs (marijuana, cocaine, heroin, ecstasy, MDMA or other)                For at least one week prior to your surgery.  Combination of these drugs with anesthesia                May have life threatening results.  __X__6.  Notify your doctor if there is any change in your medical condition      (cold, fever, infections).     Do not wear jewelry, make-up, hairpins,  clips or nail polish. Do not wear lotions, powders, or perfumes. You may wear deodorant. Do not shave 48 hours prior to surgery. Men may shave face and neck. Do not bring valuables to the hospital.    [facility] is not responsible for any belongings or valuables.  Contacts, dentures or bridgework may not be worn into surgery. Leave your suitcase in the car. After surgery it may be brought to your room. For patients admitted to the hospital, discharge time is determined by your treatment team.   Patients discharged the day of surgery will not be allowed to drive home.  Make arrangements for someone to be with you for the first 24 hours of your Same Day Discharge.   __X__ Take these medicines the morning of surgery with A SIP OF WATER:    1. buPROPion (WELLBUTRIN XL) 150 MG   2. DULoxetine (CYMBALTA) 60 MG  3. omeprazole (PRILOSEC) 40 MG  4. pregabalin (LYRICA) 150 MG    ____ Fleet Enema (as directed)   __X__ Use CHG Soap (or wipes) as directed  ____ Use Benzoyl Peroxide Gel as instructed  ____ Use inhalers on the day of surgery  ____ Stop metformin 2 days prior to surgery    ____ Take 1/2 of usual insulin dose the night before surgery. No insulin the morning          of surgery.   __X__Stop aspirin EC 81 MG as instructed by your doctor.   __X__ Stop Anti-inflammatories such as Ibuprofen, Aleve, Advil, naproxen, as   __X__ Stop supplements until after surgery.    __X__ Do not start any herbal supplements before your procedure.    If you have any questions regarding your pre-procedure instructions,  Please call Pre-admit Testing at [phone].

## 2020-08-20 ENCOUNTER — Other Ambulatory Visit: Payer: Self-pay

## 2020-08-20 ENCOUNTER — Other Ambulatory Visit
Admission: RE | Admit: 2020-08-20 | Discharge: 2020-08-20 | Disposition: A | Discharge status: Home / Self Care | Payer: Medicare Other | Source: Ambulatory Visit | Attending: Neurosurgery | Admitting: Neurosurgery

## 2020-08-20 DIAGNOSIS — Z01812 Encounter for preprocedural laboratory examination: Secondary | ICD-10-CM | POA: Diagnosis present

## 2020-08-20 DIAGNOSIS — Z20822 Contact with and (suspected) exposure to covid-19: Secondary | ICD-10-CM | POA: Diagnosis not present

## 2020-08-21 LAB — SARS CORONAVIRUS 2 (TAT 6-24 HRS): SARS Coronavirus 2: NEGATIVE

## 2020-08-22 MED ORDER — CHLORHEXIDINE GLUCONATE 0.12 % MT SOLN: 15.0000 mL | Freq: Once | OROMUCOSAL | Status: AC

## 2020-08-22 MED ORDER — LACTATED RINGERS IV SOLN: INTRAVENOUS | Status: DC

## 2020-08-22 MED ORDER — ORAL CARE MOUTH RINSE: 15.0000 mL | Freq: Once | OROMUCOSAL | Status: AC

## 2020-08-23 ENCOUNTER — Other Ambulatory Visit: Payer: Self-pay

## 2020-08-23 ENCOUNTER — Ambulatory Visit: Payer: Medicare Other | Admitting: Anesthesiology

## 2020-08-23 ENCOUNTER — Encounter
Admission: RE | Disposition: A | Discharge status: Home / Self Care | Payer: Self-pay | Source: Home / Self Care | Attending: Neurosurgery

## 2020-08-23 ENCOUNTER — Encounter: Payer: Self-pay | Admitting: Neurosurgery

## 2020-08-23 ENCOUNTER — Ambulatory Visit
Admission: RE | Admit: 2020-08-23 | Discharge: 2020-08-23 | Disposition: A | Discharge status: Home / Self Care | Payer: Medicare Other | Attending: Neurosurgery | Admitting: Neurosurgery

## 2020-08-23 DIAGNOSIS — F17210 Nicotine dependence, cigarettes, uncomplicated: Secondary | ICD-10-CM | POA: Diagnosis not present

## 2020-08-23 DIAGNOSIS — G894 Chronic pain syndrome: Secondary | ICD-10-CM | POA: Insufficient documentation

## 2020-08-23 DIAGNOSIS — Z8614 Personal history of Methicillin resistant Staphylococcus aureus infection: Secondary | ICD-10-CM | POA: Diagnosis not present

## 2020-08-23 DIAGNOSIS — G2581 Restless legs syndrome: Secondary | ICD-10-CM | POA: Diagnosis not present

## 2020-08-23 DIAGNOSIS — Z883 Allergy status to other anti-infective agents status: Secondary | ICD-10-CM | POA: Diagnosis not present

## 2020-08-23 DIAGNOSIS — I2510 Atherosclerotic heart disease of native coronary artery without angina pectoris: Secondary | ICD-10-CM | POA: Insufficient documentation

## 2020-08-23 DIAGNOSIS — Z96642 Presence of left artificial hip joint: Secondary | ICD-10-CM | POA: Diagnosis not present

## 2020-08-23 DIAGNOSIS — Z8249 Family history of ischemic heart disease and other diseases of the circulatory system: Secondary | ICD-10-CM | POA: Diagnosis not present

## 2020-08-23 DIAGNOSIS — I10 Essential (primary) hypertension: Secondary | ICD-10-CM | POA: Diagnosis not present

## 2020-08-23 DIAGNOSIS — Z7982 Long term (current) use of aspirin: Secondary | ICD-10-CM | POA: Diagnosis not present

## 2020-08-23 DIAGNOSIS — I252 Old myocardial infarction: Secondary | ICD-10-CM | POA: Insufficient documentation

## 2020-08-23 DIAGNOSIS — E785 Hyperlipidemia, unspecified: Secondary | ICD-10-CM | POA: Diagnosis not present

## 2020-08-23 DIAGNOSIS — G4730 Sleep apnea, unspecified: Secondary | ICD-10-CM | POA: Diagnosis not present

## 2020-08-23 DIAGNOSIS — Z79899 Other long term (current) drug therapy: Secondary | ICD-10-CM | POA: Insufficient documentation

## 2020-08-23 DIAGNOSIS — Z888 Allergy status to other drugs, medicaments and biological substances status: Secondary | ICD-10-CM | POA: Insufficient documentation

## 2020-08-23 HISTORY — PX: LUMBAR WOUND DEBRIDEMENT: SHX1988

## 2020-08-23 SURGERY — LUMBAR WOUND DEBRIDEMENT
Anesthesia: General | Laterality: Left

## 2020-08-23 MED ORDER — PROPOFOL 10 MG/ML IV BOLUS
INTRAVENOUS | Status: DC | PRN
  Administered 2020-08-23: 40 mg via INTRAVENOUS

## 2020-08-23 MED ORDER — CEFAZOLIN SODIUM-DEXTROSE 2-4 GM/100ML-% IV SOLN
INTRAVENOUS | Status: AC
  Filled 2020-08-23: qty 100

## 2020-08-23 MED ORDER — ONDANSETRON HCL 4 MG/2ML IJ SOLN: 4.0000 mg | Freq: Once | INTRAMUSCULAR | Status: DC | PRN

## 2020-08-23 MED ORDER — MIDAZOLAM HCL 2 MG/2ML IJ SOLN
INTRAMUSCULAR | Status: DC | PRN
  Administered 2020-08-23: 2 mg via INTRAVENOUS

## 2020-08-23 MED ORDER — PROPOFOL 10 MG/ML IV BOLUS
INTRAVENOUS | Status: AC
  Filled 2020-08-23: qty 40

## 2020-08-23 MED ORDER — CEFAZOLIN SODIUM-DEXTROSE 2-4 GM/100ML-% IV SOLN
2.0000 g | INTRAVENOUS | Status: AC
  Administered 2020-08-23: 2 g via INTRAVENOUS

## 2020-08-23 MED ORDER — FENTANYL CITRATE (PF) 100 MCG/2ML IJ SOLN: 25.0000 ug | INTRAMUSCULAR | Status: DC | PRN

## 2020-08-23 MED ORDER — ACETAMINOPHEN 10 MG/ML IV SOLN: 1000.0000 mg | Freq: Once | INTRAVENOUS | Status: DC | PRN

## 2020-08-23 MED ORDER — OXYCODONE HCL 5 MG/5ML PO SOLN: 5.0000 mg | Freq: Once | ORAL | Status: DC | PRN

## 2020-08-23 MED ORDER — BUPIVACAINE-EPINEPHRINE (PF) 0.5% -1:200000 IJ SOLN
INTRAMUSCULAR | Status: DC | PRN
  Administered 2020-08-23: 10 mL

## 2020-08-23 MED ORDER — PROPOFOL 500 MG/50ML IV EMUL
INTRAVENOUS | Status: AC
  Filled 2020-08-23: qty 50

## 2020-08-23 MED ORDER — OXYCODONE HCL 5 MG PO TABS: 5.0000 mg | ORAL_TABLET | Freq: Once | ORAL | Status: DC | PRN

## 2020-08-23 MED ORDER — FENTANYL CITRATE (PF) 100 MCG/2ML IJ SOLN
INTRAMUSCULAR | Status: AC
  Filled 2020-08-23: qty 2

## 2020-08-23 MED ORDER — MIDAZOLAM HCL 2 MG/2ML IJ SOLN
INTRAMUSCULAR | Status: AC
  Filled 2020-08-23: qty 2

## 2020-08-23 MED ORDER — CHLORHEXIDINE GLUCONATE 0.12 % MT SOLN
OROMUCOSAL | Status: AC
  Administered 2020-08-23: 15 mL via OROMUCOSAL
  Filled 2020-08-23: qty 15

## 2020-08-23 MED ORDER — PROPOFOL 10 MG/ML IV BOLUS
INTRAVENOUS | Status: AC
  Filled 2020-08-23: qty 20

## 2020-08-23 MED ORDER — FENTANYL CITRATE (PF) 100 MCG/2ML IJ SOLN
INTRAMUSCULAR | Status: DC | PRN
  Administered 2020-08-23 (×2): 25 ug via INTRAVENOUS

## 2020-08-23 MED ORDER — PROPOFOL 500 MG/50ML IV EMUL
INTRAVENOUS | Status: DC | PRN
  Administered 2020-08-23: 145 ug/kg/min via INTRAVENOUS

## 2020-08-23 SURGICAL SUPPLY — 69 items
AGENT HMST MTR 8 SURGIFLO (HEMOSTASIS)
APL PRP STRL LF DISP 70% ISPRP (MISCELLANEOUS) ×1
BLADE BOVIE TIP EXT 4 (BLADE) IMPLANT
BUR NEURO DRILL SOFT 3.0X3.8M (BURR) IMPLANT
CANISTER SUCT 1200ML W/VALVE (MISCELLANEOUS) ×4 IMPLANT
CHLORAPREP W/TINT 26 (MISCELLANEOUS) ×3 IMPLANT
COUNTER NEEDLE 20/40 LG (NEEDLE) ×2 IMPLANT
COVER LIGHT HANDLE STERIS (MISCELLANEOUS) ×4 IMPLANT
COVER WAND RF STERILE (DRAPES) ×2 IMPLANT
CUP MEDICINE 2OZ PLAST GRAD ST (MISCELLANEOUS) ×2 IMPLANT
DEVICE DSSCT PLSMBLD 3.0S LGHT (MISCELLANEOUS) IMPLANT
DRAPE LAPAROTOMY 100X77 ABD (DRAPES) ×2 IMPLANT
DRAPE MICROSCOPE SPINE 48X150 (DRAPES) IMPLANT
DRAPE SURG 17X11 SM STRL (DRAPES) ×2 IMPLANT
DRSG TEGADERM 4X4.75 (GAUZE/BANDAGES/DRESSINGS) ×1 IMPLANT
ELECT CAUTERY BLADE TIP 2.5 (TIP)
ELECT EZSTD 165MM 6.5IN (MISCELLANEOUS)
ELECT REM PT RETURN 9FT ADLT (ELECTROSURGICAL) ×2
ELECTRODE CAUTERY BLDE TIP 2.5 (TIP) IMPLANT
ELECTRODE EZSTD 165MM 6.5IN (MISCELLANEOUS) IMPLANT
ELECTRODE REM PT RTRN 9FT ADLT (ELECTROSURGICAL) ×1 IMPLANT
GAUZE SPONGE 4X4 12PLY STRL (GAUZE/BANDAGES/DRESSINGS) ×2 IMPLANT
GAUZE XEROFORM 1X8 LF (GAUZE/BANDAGES/DRESSINGS) ×3 IMPLANT
GLOVE SRG 8 PF TXTR STRL LF DI (GLOVE) ×1 IMPLANT
GLOVE SURG SYN 7.0 (GLOVE) ×4 IMPLANT
GLOVE SURG SYN 7.0 PF PI (GLOVE) ×2 IMPLANT
GLOVE SURG SYN 8.0 (GLOVE) ×4 IMPLANT
GLOVE SURG SYN 8.0 PF PI (GLOVE) ×2 IMPLANT
GLOVE SURG UNDER POLY LF SZ7 (GLOVE) ×2 IMPLANT
GLOVE SURG UNDER POLY LF SZ8 (GLOVE) ×2
GOWN STRL REUS W/ TWL LRG LVL3 (GOWN DISPOSABLE) ×1 IMPLANT
GOWN STRL REUS W/ TWL XL LVL3 (GOWN DISPOSABLE) ×2 IMPLANT
GOWN STRL REUS W/TWL LRG LVL3 (GOWN DISPOSABLE) ×2
GOWN STRL REUS W/TWL XL LVL3 (GOWN DISPOSABLE) ×4
GRADUATE 1200CC STRL 31836 (MISCELLANEOUS) ×2 IMPLANT
HEMOVAC 400ML (MISCELLANEOUS)
KIT DRAIN HEMOVAC JP 7FR 400ML (MISCELLANEOUS) IMPLANT
KIT TURNOVER KIT A (KITS) ×2 IMPLANT
KIT WILSON FRAME (KITS) ×1 IMPLANT
MANIFOLD NEPTUNE II (INSTRUMENTS) ×2 IMPLANT
MARKER SKIN DUAL TIP RULER LAB (MISCELLANEOUS) ×4 IMPLANT
NDL SAFETY ECLIPSE 18X1.5 (NEEDLE) ×1 IMPLANT
NEEDLE HYPO 18GX1.5 SHARP (NEEDLE) ×2
NEEDLE HYPO 22GX1.5 SAFETY (NEEDLE) ×2 IMPLANT
NS IRRIG 1000ML POUR BTL (IV SOLUTION) ×2 IMPLANT
PACK LAMINECTOMY NEURO (CUSTOM PROCEDURE TRAY) ×2 IMPLANT
PAD ARMBOARD 7.5X6 YLW CONV (MISCELLANEOUS) ×4 IMPLANT
PLASMABLADE 3.0S W/LIGHT (MISCELLANEOUS)
PULSAVAC PLUS IRRIG FAN TIP (DISPOSABLE)
SOL .9 NS 3000ML IRR  AL (IV SOLUTION) ×2
SOL .9 NS 3000ML IRR AL (IV SOLUTION) ×1
SOL .9 NS 3000ML IRR UROMATIC (IV SOLUTION) ×1 IMPLANT
SPOGE SURGIFLO 8M (HEMOSTASIS)
SPONGE SURGIFLO 8M (HEMOSTASIS) IMPLANT
STAPLER SKIN PROX 35W (STAPLE) IMPLANT
SUT ETHILON 3-0 (SUTURE) ×1 IMPLANT
SUT ETHILON 3-0 FS-10 30 BLK (SUTURE)
SUT NURALON 4 0 TR CR/8 (SUTURE) IMPLANT
SUT PDS 2-0 27IN (SUTURE) IMPLANT
SUT PDS II 3-0 (SUTURE) IMPLANT
SUT PDSII 8 18 CT-1 CR8 (SUTURE) IMPLANT
SUT POLYSORB 2-0 5X18 GS-10 (SUTURE) ×1 IMPLANT
SUTURE EHLN 3-0 FS-10 30 BLK (SUTURE) IMPLANT
SYR 10ML LL (SYRINGE) ×4 IMPLANT
SYR 20ML LL LF (SYRINGE) ×2 IMPLANT
SYR 30ML LL (SYRINGE) ×4 IMPLANT
TIP FAN IRRIG PULSAVAC PLUS (DISPOSABLE) IMPLANT
TOWEL OR 17X26 4PK STRL BLUE (TOWEL DISPOSABLE) ×4 IMPLANT
TUBING CONNECTING 10 (TUBING) ×4 IMPLANT
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
## 2020-08-23 NOTE — Progress Notes (Signed)
Pharmacy Antibiotic Note  Kevin Alexander is a 55 y.o. male admitted on 08/23/2020 with surgical prophylaxis.  Pharmacy has been consulted for cefazolin dosing.  TBW = 58.1 kg   Plan: Cefazolin 2 gm IV X 1 60 min pre op.   Height: 5\' 7"  (170.2 cm) Weight: 58.1 kg (128 lb) IBW/kg (Calculated) : 66.1  Temp (24hrs), Avg:97.9 F (36.6 C), Min:97.9 F (36.6 C), Max:97.9 F (36.6 C)  No results for input(s): WBC, CREATININE, LATICACIDVEN, VANCOTROUGH, VANCOPEAK, VANCORANDOM, GENTTROUGH, GENTPEAK, GENTRANDOM, TOBRATROUGH, TOBRAPEAK, TOBRARND, AMIKACINPEAK, AMIKACINTROU, AMIKACIN in the last 168 hours.  CrCl cannot be calculated (Patient's most recent lab result is older than the maximum 21 days allowed.).    Allergies  Allergen Reactions  . Fluconazole Rash  . Gabapentin Other (See Comments)    Passing out   . Pantoprazole Nausea And Vomiting  . Amlodipine Itching and Rash    Antimicrobials this admission:   >>    >>   Dose adjustments this admission:   Microbiology results:  BCx:   UCx:    Sputum:    MRSA PCR:   Thank you for allowing pharmacy to be a part of this patient's care.  Robbins,Jason D 08/23/2020 6:42 AM

## 2020-08-23 NOTE — Anesthesia Postprocedure Evaluation (Signed)
Anesthesia Post Note  Patient: Kevin Alexander  Procedure(s) Performed: REVISION OF LEFT FLANK WOUND (Left )  Patient location during evaluation: PACU Anesthesia Type: General Level of consciousness: awake and alert Pain management: pain level controlled Vital Signs Assessment: post-procedure vital signs reviewed and stable Respiratory status: spontaneous breathing, nonlabored ventilation, respiratory function stable and patient connected to nasal cannula oxygen Cardiovascular status: stable and blood pressure returned to baseline Postop Assessment: no apparent nausea or vomiting Anesthetic complications: no   No complications documented.   Last Vitals:  Vitals:   08/23/20 0824 08/23/20 0833  BP: 104/79 135/69  Pulse: 71 66  Resp: 17 16  Temp: (!) 36.3 C 36.4 C  SpO2: 100% 100%    Last Pain:  Vitals:   08/23/20 0833  TempSrc: Oral  PainSc: 0-No pain                 Arita Miss

## 2020-08-23 NOTE — Discharge Instructions (Addendum)
NEUROSURGERY DISCHARGE INSTRUCTIONS  Admission diagnosis: poor wound healing  Operative procedure: Wound revision  What to do after you leave the hospital:  Recommended diet: regular diet. Increase protein intake to promote wound healing.  Recommended activity: activity as tolerated.You should walk multiple times per day  Special Instructions  No straining, no heavy lifting > 10lbs x 4 weeks.  Keep incision area clean and dry. May shower in 2 days. No baths or pools for 6 weeks.  Please remove dressing tomorrow, no need to apply a bandage afterwards  You have sutures that will be removed in clinic.  Please take home medications as directed. You may start aspirin tomorrow   Please Report any of the following: Nausea or Vomiting, Temperature is greater than 101.44F (38.1C) degrees, Dizziness, Abdominal Pain, Difficulty Breathing or Shortness of Breath, Inability to Eat, drink Fluids, or Take medications, Bleeding, swelling, or drainage from surgical incision sites, New numbness or weakness, and Bowel or bladder dysfunction to the neurosurgeon on call at [phone]  Additional Follow up appointments Please follow up with Dr Lacinda Axon in [place] clinic as scheduled in 2-3 weeks   Please see below for scheduled appointments:  Future Appointments  Date Time Provider [place]  08/26/2020  3:15 PM [facility]    AMBULATORY SURGERY  DISCHARGE INSTRUCTIONS   1) The drugs that you were given will stay in your system until tomorrow so for the next 24 hours you should not:  A) Drive an automobile B) Make any legal decisions C) Drink any alcoholic beverage   2) You may resume regular meals tomorrow.  Today it is better to start with liquids and gradually work up to solid foods.  You may eat anything you prefer, but it is better to start with liquids, then soup and crackers, and gradually work up to solid foods.   3) Please notify your doctor immediately if  you have any unusual bleeding, trouble breathing, redness and pain at the surgery site, drainage, fever, or pain not relieved by medication.    4) Additional Instructions:        Please contact your physician with any problems or Same Day Surgery at [phone], Monday through Friday 6 am to 4 pm, or [place] at [facility] number at [phone].AMBULATORY SURGERY  DISCHARGE INSTRUCTIONS   5) The drugs that you were given will stay in your system until tomorrow so for the next 24 hours you should not:  D) Drive an automobile E) Make any legal decisions F) Drink any alcoholic beverage   6) You may resume regular meals tomorrow.  Today it is better to start with liquids and gradually work up to solid foods.  You may eat anything you prefer, but it is better to start with liquids, then soup and crackers, and gradually work up to solid foods.   7) Please notify your doctor immediately if you have any unusual bleeding, trouble breathing, redness and pain at the surgery site, drainage, fever, or pain not relieved by medication.    8) Additional Instructions:        Please contact your physician with any problems or Same Day Surgery at [phone], Monday through Friday 6 am to 4 pm, or [place] at [facility] number at [phone].

## 2020-08-23 NOTE — Transfer of Care (Signed)
Immediate Anesthesia Transfer of Care Note  Patient: Kevin Alexander  Procedure(s) Performed: REVISION OF LEFT FLANK WOUND (Left )  Patient Location: PACU  Anesthesia Type:General  Level of Consciousness: awake, alert  and oriented  Airway & Oxygen Therapy: Patient Spontanous Breathing  Post-op Assessment: Report given to RN and Post -op Vital signs reviewed and stable  Post vital signs: Reviewed and stable  Last Vitals:  Vitals Value Taken Time  BP 107/72 08/23/20 0803  Temp    Pulse 73 08/23/20 0806  Resp 14 08/23/20 0806  SpO2 96 % 08/23/20 0806  Vitals shown include unvalidated device data.  Last Pain:  Vitals:   08/23/20 0624  TempSrc: Temporal  PainSc: 0-No pain      Patients Stated Pain Goal: 0 (03/79/44 4619)  Complications: No complications documented.

## 2020-08-23 NOTE — Interval H&P Note (Signed)
History and Physical Interval Note:  08/23/2020 6:42 AM  Kevin Alexander  has presented today for surgery, with the diagnosis of poor wound healing.  The various methods of treatment have been discussed with the patient and family. After consideration of risks, benefits and other options for treatment, the patient has consented to  Procedure(s): REVISION OF LEFT FLANK WOUND (Left) as a surgical intervention.  The patient's history has been reviewed, patient examined, no change in status, stable for surgery.  I have reviewed the patient's chart and labs.  Questions were answered to the patient's satisfaction.     Deetta Perla
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
## 2020-08-23 NOTE — Op Note (Signed)
Operative Note  SURGERY DATE:08/23/2020  PRE-OP DIAGNOSIS: Chronic Pain Syndrome  POST-OP DIAGNOSIS:Post-Op Diagnosis Codes:         Chronic Pain Syndrome  Procedure(s) with comments: Revision left flank wound  SURGEON:  Malen Gauze, MD   ANESTHESIA:MAC/Local  OPERATIVE FINDINGS:Poor wound healing  Operaive Procedure Indication: Mr Stejskal was seen in clinic on 3/1 and found to have good relief of pain with recent SCS. His wound was slowly healing and there was a large eschar. Given concern for poor wound healing and infection, we placed ona ntibiotics and recommended wound revision.  Procedure:  The patient was brought to the operating room and placed with the left side up in a lateral position on a standard OR table. The anesthesia service had vascular access and provided sedation medications to the patient.  The left flank incision was prepped and draped in a sterile fashion and the previous incision marked.  A hard timeout was performed.  Local anesthetic was instilled into the incision.   Incision was opened sharply to the subcutaneous layer. The eschar was removed sharply and the adjacent tissue appeared healthy. The battery was seen deep but no signs of infection seen. The wound was irrigated profusely.   The tissue was closed with 2-0 Vicryl.  The skin was closed with 3-0 Nylon.  A dressing was applied.  Patient was turned to the supine position and sedation medications were stopped and the patient was awoken from anesthesia.  Patient was taken to the PACU for further recovery and seen to be at neurologic baseline.  I discussed the results with the family    ESTIMATED BLOOD LOSS: 5cc  SPECIMENS None  IMPLANT None   I performed the case in its entirety  Deetta Perla, [place]

## 2020-08-24 ENCOUNTER — Encounter: Payer: Self-pay | Admitting: Neurosurgery

## 2020-08-26 ENCOUNTER — Ambulatory Visit: Payer: Medicare Other

## 2020-09-09 ENCOUNTER — Ambulatory Visit: Payer: Medicare Other

## 2020-12-23 ENCOUNTER — Telehealth: Payer: Self-pay | Admitting: Gastroenterology

## 2020-12-23 NOTE — Telephone Encounter (Signed)
Returned patient's Call -LVM-Patient wants to schedule an appt with Dr. Allen Norris ( patient has diarrhea & Vomiting).

## 2020-12-28 ENCOUNTER — Encounter: Payer: Self-pay | Admitting: Medical Oncology

## 2020-12-28 ENCOUNTER — Telehealth: Payer: Self-pay | Admitting: Emergency Medicine

## 2020-12-28 ENCOUNTER — Telehealth: Payer: Self-pay

## 2020-12-28 ENCOUNTER — Emergency Department: Payer: Medicare Other

## 2020-12-28 ENCOUNTER — Emergency Department
Admission: EM | Admit: 2020-12-28 | Discharge: 2020-12-28 | Disposition: A | Discharge status: Home / Self Care | Payer: Medicare Other | Attending: Emergency Medicine | Admitting: Emergency Medicine

## 2020-12-28 DIAGNOSIS — F17210 Nicotine dependence, cigarettes, uncomplicated: Secondary | ICD-10-CM | POA: Diagnosis not present

## 2020-12-28 DIAGNOSIS — Z720 Tobacco use: Secondary | ICD-10-CM

## 2020-12-28 DIAGNOSIS — E876 Hypokalemia: Secondary | ICD-10-CM

## 2020-12-28 DIAGNOSIS — R112 Nausea with vomiting, unspecified: Secondary | ICD-10-CM | POA: Insufficient documentation

## 2020-12-28 DIAGNOSIS — I2510 Atherosclerotic heart disease of native coronary artery without angina pectoris: Secondary | ICD-10-CM | POA: Diagnosis not present

## 2020-12-28 DIAGNOSIS — E860 Dehydration: Secondary | ICD-10-CM | POA: Diagnosis not present

## 2020-12-28 DIAGNOSIS — Z20822 Contact with and (suspected) exposure to covid-19: Secondary | ICD-10-CM | POA: Insufficient documentation

## 2020-12-28 DIAGNOSIS — N189 Chronic kidney disease, unspecified: Secondary | ICD-10-CM | POA: Insufficient documentation

## 2020-12-28 DIAGNOSIS — Z7982 Long term (current) use of aspirin: Secondary | ICD-10-CM | POA: Diagnosis not present

## 2020-12-28 DIAGNOSIS — J45909 Unspecified asthma, uncomplicated: Secondary | ICD-10-CM | POA: Diagnosis not present

## 2020-12-28 DIAGNOSIS — Z96642 Presence of left artificial hip joint: Secondary | ICD-10-CM | POA: Insufficient documentation

## 2020-12-28 DIAGNOSIS — J449 Chronic obstructive pulmonary disease, unspecified: Secondary | ICD-10-CM | POA: Insufficient documentation

## 2020-12-28 DIAGNOSIS — R197 Diarrhea, unspecified: Secondary | ICD-10-CM | POA: Insufficient documentation

## 2020-12-28 DIAGNOSIS — Z79899 Other long term (current) drug therapy: Secondary | ICD-10-CM | POA: Insufficient documentation

## 2020-12-28 DIAGNOSIS — R000 Tachycardia, unspecified: Secondary | ICD-10-CM | POA: Insufficient documentation

## 2020-12-28 DIAGNOSIS — I129 Hypertensive chronic kidney disease with stage 1 through stage 4 chronic kidney disease, or unspecified chronic kidney disease: Secondary | ICD-10-CM | POA: Insufficient documentation

## 2020-12-28 LAB — CBC
HCT: 47 % (ref 39.0–52.0)
Hemoglobin: 15.7 g/dL (ref 13.0–17.0)
MCH: 31.3 pg (ref 26.0–34.0)
MCHC: 33.4 g/dL (ref 30.0–36.0)
MCV: 93.6 fL (ref 80.0–100.0)
Platelets: 322 10*3/uL (ref 150–400)
RBC: 5.02 MIL/uL (ref 4.22–5.81)
RDW: 13.5 % (ref 11.5–15.5)
WBC: 12.3 10*3/uL — ABNORMAL HIGH (ref 4.0–10.5)
nRBC: 0 % (ref 0.0–0.2)

## 2020-12-28 LAB — COMPREHENSIVE METABOLIC PANEL
ALT: 11 U/L (ref 0–44)
AST: 17 U/L (ref 15–41)
Albumin: 3.6 g/dL (ref 3.5–5.0)
Alkaline Phosphatase: 112 U/L (ref 38–126)
Anion gap: 10 (ref 5–15)
BUN: 11 mg/dL (ref 6–20)
CO2: 22 mmol/L (ref 22–32)
Calcium: 9.3 mg/dL (ref 8.9–10.3)
Chloride: 105 mmol/L (ref 98–111)
Creatinine, Ser: 1.45 mg/dL — ABNORMAL HIGH (ref 0.61–1.24)
GFR, Estimated: 57 mL/min — ABNORMAL LOW (ref >60)
Glucose, Bld: 127 mg/dL — ABNORMAL HIGH (ref 70–99)
Potassium: 2.8 mmol/L — ABNORMAL LOW (ref 3.5–5.1)
Sodium: 137 mmol/L (ref 135–145)
Total Bilirubin: 0.5 mg/dL (ref 0.3–1.2)
Total Protein: 7.5 g/dL (ref 6.5–8.1)

## 2020-12-28 LAB — GASTROINTESTINAL PANEL BY PCR, STOOL (REPLACES STOOL CULTURE)

## 2020-12-28 LAB — URINALYSIS, COMPLETE (UACMP) WITH MICROSCOPIC
Bacteria, UA: NONE SEEN
Bilirubin Urine: NEGATIVE
Glucose, UA: NEGATIVE mg/dL
Hgb urine dipstick: NEGATIVE
Ketones, ur: NEGATIVE mg/dL
Leukocytes,Ua: NEGATIVE
Nitrite: NEGATIVE
Protein, ur: NEGATIVE mg/dL
Specific Gravity, Urine: 1.004 — ABNORMAL LOW (ref 1.005–1.030)
Squamous Epithelial / HPF: NONE SEEN (ref 0–5)
pH: 6 (ref 5.0–8.0)

## 2020-12-28 LAB — RESP PANEL BY RT-PCR (FLU A&B, COVID) ARPGX2
Influenza A by PCR: NEGATIVE
Influenza B by PCR: NEGATIVE
SARS Coronavirus 2 by RT PCR: NEGATIVE

## 2020-12-28 LAB — MAGNESIUM: Magnesium: 2.3 mg/dL (ref 1.7–2.4)

## 2020-12-28 LAB — LIPASE, BLOOD: Lipase: 35 U/L (ref 11–51)

## 2020-12-28 MED ORDER — ONDANSETRON 4 MG PO TBDP
4.0000 mg | ORAL_TABLET | Freq: Once | ORAL | Status: AC
  Administered 2020-12-28: 4 mg via ORAL
  Filled 2020-12-28: qty 1

## 2020-12-28 MED ORDER — LACTATED RINGERS IV BOLUS
1000.0000 mL | Freq: Once | INTRAVENOUS | Status: AC
  Administered 2020-12-28: 1000 mL via INTRAVENOUS

## 2020-12-28 MED ORDER — POTASSIUM CHLORIDE 20 MEQ PO PACK: 40.0000 meq | PACK | Freq: Two times a day (BID) | ORAL | Status: DC

## 2020-12-28 MED ORDER — POTASSIUM CHLORIDE 20 MEQ PO PACK
40.0000 meq | PACK | ORAL | Status: AC
  Administered 2020-12-28: 40 meq via ORAL
  Filled 2020-12-28: qty 2

## 2020-12-28 MED ORDER — ONDANSETRON HCL 4 MG PO TABS: 4.0000 mg | ORAL_TABLET | Freq: Three times a day (TID) | ORAL | 0 refills | Status: DC | PRN

## 2020-12-28 MED ORDER — POTASSIUM CHLORIDE 10 MEQ/100ML IV SOLN
10.0000 meq | INTRAVENOUS | Status: DC
  Administered 2020-12-28 (×3): 10 meq via INTRAVENOUS
  Filled 2020-12-28 (×3): qty 100

## 2020-12-28 MED ORDER — AZITHROMYCIN 500 MG PO TABS: 500.0000 mg | ORAL_TABLET | Freq: Every day | ORAL | 0 refills | Status: AC

## 2020-12-28 NOTE — Telephone Encounter (Signed)
Patient caregiver called and left a voicemail stating they needed to make a appointment to see Dr. Allen Norris. Called and left a message for call back to scheduled the appointment

## 2020-12-28 NOTE — ED Notes (Signed)
Pt provided blanket as requested.  Urinal at bedside for sample

## 2020-12-28 NOTE — Telephone Encounter (Signed)
Gi pathogen panel POS for e. Coli. Discussed with pt and I think reasonable to try short course of azithro given duration of reported diarrhea and N/V. Electronically sent to pharmacy. Advised he must still f/u with PCP and GI. He understands and is amenable to this plan.

## 2020-12-28 NOTE — ED Notes (Signed)
Pt ambulatory to restroom

## 2020-12-28 NOTE — ED Triage Notes (Signed)
Pt from home reports of nausea and vomiting almost every time he eats for the past 1.5 months, states that he just feels weak. Also reports diarrhea. Pt denies pain.

## 2020-12-28 NOTE — ED Notes (Signed)
US at bedside
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
## 2021-01-03 ENCOUNTER — Telehealth: Payer: Self-pay | Admitting: Gastroenterology

## 2021-01-03 NOTE — Telephone Encounter (Signed)
Returned Kevin Alexander's call regarding treatment that was prescribed by the physician at [facility]. Advised her Kevin Alexander was prescribed the correct course for the e-coli. Pt's stools are becoming more formed but still soft. Advised her to have him start an OTC probiotic daily. Advised her if the diarrhea returns, she is to call the office back as soon as possible.

## 2021-01-03 NOTE — Telephone Encounter (Signed)
LVM that Kevin Alexander was in the ED this past weekend with a bacterial infection and want to make sure they are on the correct medication. Please advise.

## 2021-01-18 ENCOUNTER — Emergency Department
Admission: EM | Admit: 2021-01-18 | Discharge: 2021-01-18 | Disposition: A | Discharge status: Home / Self Care | Payer: Medicare Other | Attending: Emergency Medicine | Admitting: Emergency Medicine

## 2021-01-18 ENCOUNTER — Other Ambulatory Visit: Payer: Self-pay

## 2021-01-18 DIAGNOSIS — R112 Nausea with vomiting, unspecified: Secondary | ICD-10-CM | POA: Insufficient documentation

## 2021-01-18 DIAGNOSIS — R42 Dizziness and giddiness: Secondary | ICD-10-CM | POA: Diagnosis not present

## 2021-01-18 DIAGNOSIS — R197 Diarrhea, unspecified: Secondary | ICD-10-CM | POA: Insufficient documentation

## 2021-01-18 DIAGNOSIS — Z5321 Procedure and treatment not carried out due to patient leaving prior to being seen by health care provider: Secondary | ICD-10-CM | POA: Insufficient documentation

## 2021-01-18 DIAGNOSIS — L299 Pruritus, unspecified: Secondary | ICD-10-CM | POA: Insufficient documentation

## 2021-01-18 LAB — COMPREHENSIVE METABOLIC PANEL
ALT: 8 U/L (ref 0–44)
AST: 12 U/L — ABNORMAL LOW (ref 15–41)
Albumin: 3.1 g/dL — ABNORMAL LOW (ref 3.5–5.0)
Alkaline Phosphatase: 131 U/L — ABNORMAL HIGH (ref 38–126)
Anion gap: 8 (ref 5–15)
BUN: 13 mg/dL (ref 6–20)
CO2: 26 mmol/L (ref 22–32)
Calcium: 8.9 mg/dL (ref 8.9–10.3)
Chloride: 97 mmol/L — ABNORMAL LOW (ref 98–111)
Creatinine, Ser: 1.38 mg/dL — ABNORMAL HIGH (ref 0.61–1.24)
GFR, Estimated: >60 mL/min (ref >60)
Glucose, Bld: 128 mg/dL — ABNORMAL HIGH (ref 70–99)
Potassium: 3 mmol/L — ABNORMAL LOW (ref 3.5–5.1)
Sodium: 131 mmol/L — ABNORMAL LOW (ref 135–145)
Total Bilirubin: 0.6 mg/dL (ref 0.3–1.2)
Total Protein: 7.1 g/dL (ref 6.5–8.1)

## 2021-01-18 LAB — CBC
HCT: 43.5 % (ref 39.0–52.0)
Hemoglobin: 14.9 g/dL (ref 13.0–17.0)
MCH: 30.6 pg (ref 26.0–34.0)
MCHC: 34.3 g/dL (ref 30.0–36.0)
MCV: 89.3 fL (ref 80.0–100.0)
Platelets: 429 10*3/uL — ABNORMAL HIGH (ref 150–400)
RBC: 4.87 MIL/uL (ref 4.22–5.81)
RDW: 12.9 % (ref 11.5–15.5)
WBC: 12 10*3/uL — ABNORMAL HIGH (ref 4.0–10.5)
nRBC: 0 % (ref 0.0–0.2)

## 2021-01-18 LAB — LIPASE, BLOOD: Lipase: 41 U/L (ref 11–51)

## 2021-01-18 NOTE — ED Triage Notes (Signed)
Pt here with N/V/D for a few weeks. Pt reports that he is itching all over. Pt states that he can't eat because he can't keep anything down and he is also lightheaded. Pt stable in triage.

## 2021-01-26 ENCOUNTER — Emergency Department
Admission: EM | Admit: 2021-01-26 | Discharge: 2021-01-26 | Disposition: A | Discharge status: Home / Self Care | Payer: Medicare Other | Attending: Emergency Medicine | Admitting: Emergency Medicine

## 2021-01-26 ENCOUNTER — Other Ambulatory Visit: Payer: Self-pay

## 2021-01-26 ENCOUNTER — Encounter: Payer: Self-pay | Admitting: Emergency Medicine

## 2021-01-26 ENCOUNTER — Emergency Department: Payer: Medicare Other

## 2021-01-26 DIAGNOSIS — E876 Hypokalemia: Secondary | ICD-10-CM | POA: Insufficient documentation

## 2021-01-26 DIAGNOSIS — I119 Hypertensive heart disease without heart failure: Secondary | ICD-10-CM | POA: Diagnosis not present

## 2021-01-26 DIAGNOSIS — J45909 Unspecified asthma, uncomplicated: Secondary | ICD-10-CM | POA: Insufficient documentation

## 2021-01-26 DIAGNOSIS — R112 Nausea with vomiting, unspecified: Secondary | ICD-10-CM | POA: Insufficient documentation

## 2021-01-26 DIAGNOSIS — F17210 Nicotine dependence, cigarettes, uncomplicated: Secondary | ICD-10-CM | POA: Diagnosis not present

## 2021-01-26 DIAGNOSIS — I2510 Atherosclerotic heart disease of native coronary artery without angina pectoris: Secondary | ICD-10-CM | POA: Diagnosis not present

## 2021-01-26 DIAGNOSIS — Z96641 Presence of right artificial hip joint: Secondary | ICD-10-CM | POA: Insufficient documentation

## 2021-01-26 DIAGNOSIS — Z7982 Long term (current) use of aspirin: Secondary | ICD-10-CM | POA: Diagnosis not present

## 2021-01-26 DIAGNOSIS — F12188 Cannabis abuse with other cannabis-induced disorder: Secondary | ICD-10-CM | POA: Insufficient documentation

## 2021-01-26 DIAGNOSIS — R197 Diarrhea, unspecified: Secondary | ICD-10-CM | POA: Diagnosis not present

## 2021-01-26 DIAGNOSIS — R531 Weakness: Secondary | ICD-10-CM | POA: Diagnosis not present

## 2021-01-26 DIAGNOSIS — Z79899 Other long term (current) drug therapy: Secondary | ICD-10-CM | POA: Insufficient documentation

## 2021-01-26 DIAGNOSIS — R0602 Shortness of breath: Secondary | ICD-10-CM | POA: Diagnosis present

## 2021-01-26 DIAGNOSIS — J441 Chronic obstructive pulmonary disease with (acute) exacerbation: Secondary | ICD-10-CM | POA: Insufficient documentation

## 2021-01-26 LAB — URINALYSIS, COMPLETE (UACMP) WITH MICROSCOPIC
Bilirubin Urine: NEGATIVE
Glucose, UA: NEGATIVE mg/dL
Hgb urine dipstick: NEGATIVE
Ketones, ur: NEGATIVE mg/dL
Leukocytes,Ua: NEGATIVE
Nitrite: NEGATIVE
Protein, ur: 30 mg/dL — AB
Specific Gravity, Urine: 1.011 (ref 1.005–1.030)
Squamous Epithelial / HPF: NONE SEEN (ref 0–5)
pH: 6 (ref 5.0–8.0)

## 2021-01-26 LAB — CBC
HCT: 38.8 % — ABNORMAL LOW (ref 39.0–52.0)
Hemoglobin: 13.1 g/dL (ref 13.0–17.0)
MCH: 30.4 pg (ref 26.0–34.0)
MCHC: 33.8 g/dL (ref 30.0–36.0)
MCV: 90 fL (ref 80.0–100.0)
Platelets: 419 10*3/uL — ABNORMAL HIGH (ref 150–400)
RBC: 4.31 MIL/uL (ref 4.22–5.81)
RDW: 12.6 % (ref 11.5–15.5)
WBC: 9.7 10*3/uL (ref 4.0–10.5)
nRBC: 0 % (ref 0.0–0.2)

## 2021-01-26 LAB — BASIC METABOLIC PANEL
Anion gap: 9 (ref 5–15)
BUN: 8 mg/dL (ref 6–20)
CO2: 26 mmol/L (ref 22–32)
Calcium: 8.8 mg/dL — ABNORMAL LOW (ref 8.9–10.3)
Chloride: 100 mmol/L (ref 98–111)
Creatinine, Ser: 1.19 mg/dL (ref 0.61–1.24)
GFR, Estimated: >60 mL/min (ref >60)
Glucose, Bld: 125 mg/dL — ABNORMAL HIGH (ref 70–99)
Potassium: 2.6 mmol/L — CL (ref 3.5–5.1)
Sodium: 135 mmol/L (ref 135–145)

## 2021-01-26 MED ORDER — KETOROLAC TROMETHAMINE 30 MG/ML IJ SOLN
15.0000 mg | Freq: Once | INTRAMUSCULAR | Status: AC
  Administered 2021-01-26: 15 mg via INTRAVENOUS
  Filled 2021-01-26: qty 1

## 2021-01-26 MED ORDER — POTASSIUM CHLORIDE CRYS ER 20 MEQ PO TBCR
40.0000 meq | EXTENDED_RELEASE_TABLET | Freq: Once | ORAL | Status: AC
  Administered 2021-01-26: 40 meq via ORAL
  Filled 2021-01-26: qty 2

## 2021-01-26 MED ORDER — POTASSIUM CHLORIDE 10 MEQ/100ML IV SOLN
10.0000 meq | INTRAVENOUS | Status: AC
Start: 2021-01-26 — End: 2021-01-26
  Administered 2021-01-26 (×2): 10 meq via INTRAVENOUS
  Filled 2021-01-26 (×2): qty 100

## 2021-01-26 MED ORDER — HALOPERIDOL LACTATE 5 MG/ML IJ SOLN
5.0000 mg | Freq: Once | INTRAMUSCULAR | Status: AC
  Administered 2021-01-26: 5 mg via INTRAVENOUS
  Filled 2021-01-26: qty 1

## 2021-01-26 MED ORDER — METHYLPREDNISOLONE SODIUM SUCC 125 MG IJ SOLR
125.0000 mg | Freq: Once | INTRAMUSCULAR | Status: AC
  Administered 2021-01-26: 125 mg via INTRAVENOUS
  Filled 2021-01-26: qty 2

## 2021-01-26 MED ORDER — ONDANSETRON HCL 4 MG/2ML IJ SOLN
4.0000 mg | Freq: Once | INTRAMUSCULAR | Status: AC
  Administered 2021-01-26: 4 mg via INTRAVENOUS
  Filled 2021-01-26: qty 2

## 2021-01-26 MED ORDER — PREDNISONE 50 MG PO TABS: 50.0000 mg | ORAL_TABLET | Freq: Every day | ORAL | 0 refills | Status: DC

## 2021-01-26 MED ORDER — ONDANSETRON 4 MG PO TBDP: 4.0000 mg | ORAL_TABLET | Freq: Three times a day (TID) | ORAL | 0 refills | Status: DC | PRN

## 2021-01-26 MED ORDER — LACTATED RINGERS IV BOLUS
1000.0000 mL | Freq: Once | INTRAVENOUS | Status: AC
  Administered 2021-01-26: 1000 mL via INTRAVENOUS

## 2021-01-26 MED ORDER — IPRATROPIUM-ALBUTEROL 0.5-2.5 (3) MG/3ML IN SOLN
3.0000 mL | Freq: Once | RESPIRATORY_TRACT | Status: AC
  Administered 2021-01-26: 3 mL via RESPIRATORY_TRACT
  Filled 2021-01-26: qty 9

## 2021-01-26 NOTE — ED Notes (Signed)
Pt presents to the ED with complaints of generalized weakness, body aches, and chills x 1 week. Pt reports nausea and low appetite. Pt at this time presents pale, and complaining of body aches at this moment. Pt afebrile, vitals stable.
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
## 2021-01-26 NOTE — ED Triage Notes (Signed)
Pt to ED via [facility] from home for generalized weakness with N/V. Patient was diagnosis with e coli 1 month ago and per roommate has not been the same since. Patient alert and oriented x4 at this time.

## 2021-01-26 NOTE — Discharge Instructions (Addendum)
Please follow-up with your PCP in the next week for potassium recheck and reevaluation.  Start taking prednisone tomorrow and take once daily for the next 4 days to finish a 5-day course of steroids for your lungs.

## 2021-01-27 ENCOUNTER — Ambulatory Visit: Payer: Medicare Other | Admitting: Gastroenterology

## 2021-01-27 ENCOUNTER — Encounter: Payer: Self-pay | Admitting: Gastroenterology

## 2021-02-02 ENCOUNTER — Telehealth: Payer: Self-pay | Admitting: Gastroenterology

## 2021-02-02 ENCOUNTER — Telehealth: Payer: Self-pay | Admitting: *Deleted

## 2021-02-02 NOTE — Telephone Encounter (Signed)
Feldpausch, Ginger, CMA  You 2 minutes ago (4:25 PM)   This pt was a no show for his appt on 01/27/21. He will need an appt for these issues.   Ginger     You routed conversation to [place], Ginger, CMA 31 minutes ago (3:56 PM)   You 31 minutes ago (3:55 PM)   Patient lvm having problems with thrush again, difficulty swallowing, vomiting and diarrhea. Was just in the ED with dehydration. What can he do?      Note     LVM that he will need to r/s appt with Dr. Allen Norris that he missed on 01/27/21.

## 2021-02-02 NOTE — Telephone Encounter (Signed)
Patient lvm having problems with thrush again, difficulty swallowing, vomiting and diarrhea. Was just in the ED with dehydration. What can he do?
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
## 2021-02-22 ENCOUNTER — Inpatient Hospital Stay: Payer: Medicare Other

## 2021-02-22 ENCOUNTER — Other Ambulatory Visit: Payer: Self-pay | Admitting: Oncology

## 2021-02-22 ENCOUNTER — Inpatient Hospital Stay: Payer: Medicare Other | Attending: Oncology | Admitting: Oncology

## 2021-02-22 ENCOUNTER — Telehealth: Payer: Self-pay

## 2021-02-22 VITALS — BP 117/86 | HR 127 | Temp 97.0°F | Resp 18 | Wt 106.5 lb

## 2021-02-22 DIAGNOSIS — R634 Abnormal weight loss: Secondary | ICD-10-CM

## 2021-02-22 DIAGNOSIS — E876 Hypokalemia: Secondary | ICD-10-CM | POA: Insufficient documentation

## 2021-02-22 DIAGNOSIS — Z0389 Encounter for observation for other suspected diseases and conditions ruled out: Secondary | ICD-10-CM | POA: Diagnosis not present

## 2021-02-22 DIAGNOSIS — F17210 Nicotine dependence, cigarettes, uncomplicated: Secondary | ICD-10-CM | POA: Insufficient documentation

## 2021-02-22 DIAGNOSIS — D509 Iron deficiency anemia, unspecified: Secondary | ICD-10-CM | POA: Diagnosis not present

## 2021-02-22 DIAGNOSIS — R8290 Unspecified abnormal findings in urine: Secondary | ICD-10-CM | POA: Insufficient documentation

## 2021-02-22 DIAGNOSIS — E871 Hypo-osmolality and hyponatremia: Secondary | ICD-10-CM | POA: Insufficient documentation

## 2021-02-22 DIAGNOSIS — D72829 Elevated white blood cell count, unspecified: Secondary | ICD-10-CM | POA: Insufficient documentation

## 2021-02-22 LAB — CBC WITH DIFFERENTIAL/PLATELET
Abs Immature Granulocytes: 0.15 10*3/uL — ABNORMAL HIGH (ref 0.00–0.07)
Basophils Absolute: 0 10*3/uL (ref 0.0–0.1)
Basophils Relative: 0 %
Eosinophils Absolute: 0 10*3/uL (ref 0.0–0.5)
Eosinophils Relative: 0 %
HCT: 38.1 % — ABNORMAL LOW (ref 39.0–52.0)
Hemoglobin: 13.1 g/dL (ref 13.0–17.0)
Immature Granulocytes: 1 %
Lymphocytes Relative: 13 %
Lymphs Abs: 2.4 10*3/uL (ref 0.7–4.0)
MCH: 30.6 pg (ref 26.0–34.0)
MCHC: 34.4 g/dL (ref 30.0–36.0)
MCV: 89 fL (ref 80.0–100.0)
Monocytes Absolute: 1.4 10*3/uL — ABNORMAL HIGH (ref 0.1–1.0)
Monocytes Relative: 7 %
Neutro Abs: 15.1 10*3/uL — ABNORMAL HIGH (ref 1.7–7.7)
Neutrophils Relative %: 79 %
Platelets: 432 10*3/uL — ABNORMAL HIGH (ref 150–400)
RBC: 4.28 MIL/uL (ref 4.22–5.81)
RDW: 12.8 % (ref 11.5–15.5)
WBC: 19.1 10*3/uL — ABNORMAL HIGH (ref 4.0–10.5)
nRBC: 0 % (ref 0.0–0.2)

## 2021-02-22 LAB — COMPREHENSIVE METABOLIC PANEL
ALT: 5 U/L (ref 0–44)
AST: 8 U/L — ABNORMAL LOW (ref 15–41)
Albumin: 3.2 g/dL — ABNORMAL LOW (ref 3.5–5.0)
Alkaline Phosphatase: 120 U/L (ref 38–126)
Anion gap: 13 (ref 5–15)
BUN: 18 mg/dL (ref 6–20)
CO2: 27 mmol/L (ref 22–32)
Calcium: 8.5 mg/dL — ABNORMAL LOW (ref 8.9–10.3)
Chloride: 85 mmol/L — ABNORMAL LOW (ref 98–111)
Creatinine, Ser: 1.04 mg/dL (ref 0.61–1.24)
GFR, Estimated: >60 mL/min (ref >60)
Glucose, Bld: 119 mg/dL — ABNORMAL HIGH (ref 70–99)
Potassium: 2.5 mmol/L — CL (ref 3.5–5.1)
Sodium: 125 mmol/L — ABNORMAL LOW (ref 135–145)
Total Bilirubin: 0.5 mg/dL (ref 0.3–1.2)
Total Protein: 7.2 g/dL (ref 6.5–8.1)

## 2021-02-22 MED ORDER — DEXAMETHASONE 4 MG PO TABS
4.0000 mg | ORAL_TABLET | Freq: Every day | ORAL | 0 refills | Status: DC
Start: 2021-02-22 — End: 2021-04-13

## 2021-02-22 NOTE — Progress Notes (Signed)
Pt has had poor appetite, nausea, vomiting, diarrhea, and weight loss for over a year now. He has been referred back to [place] by his nephrologist. Pt previously was being treated for iron deficiency anemia.

## 2021-02-22 NOTE — Telephone Encounter (Signed)
Patient has a critical potassium of 2.5 today.  Dr. Grayland Ormond wants patient scheduled for K+ 40 meq IV since he is unable to take oral meds at this time.  Marcie Bal was informed of infusion appointment for IV potassium tomorrow (02/23/21) @ 9:15.

## 2021-02-23 ENCOUNTER — Telehealth: Payer: Self-pay | Admitting: *Deleted

## 2021-02-23 ENCOUNTER — Inpatient Hospital Stay: Payer: Medicare Other

## 2021-02-23 LAB — IGG, IGA, IGM
IgA: 301 mg/dL (ref 90–386)
IgG (Immunoglobin G), Serum: 721 mg/dL (ref 603–1613)
IgM (Immunoglobulin M), Srm: 85 mg/dL (ref 20–172)

## 2021-02-23 LAB — KAPPA/LAMBDA LIGHT CHAINS
Kappa free light chain: 32 mg/L — ABNORMAL HIGH (ref 3.3–19.4)
Kappa, lambda light chain ratio: 1.25 (ref 0.26–1.65)
Lambda free light chains: 25.5 mg/L (ref 5.7–26.3)

## 2021-02-23 NOTE — Telephone Encounter (Addendum)
Kevin Alexander called reporting that patient was to have had prescription for nausea and appetite sent to [place], but [name] states that they have not received anything from [place], I do see that Dexamethasone was sent to [facility] wit receipt confirmed at 306 PM yesterday, but no antiemetic. Per Pharmacy, she has already picked up the dexamethasone and is requesting a refill for the Prednisone that Dr Vladimir Crofts ordered.

## 2021-02-24 ENCOUNTER — Other Ambulatory Visit: Payer: Self-pay

## 2021-02-24 ENCOUNTER — Inpatient Hospital Stay: Payer: Medicare Other

## 2021-02-24 ENCOUNTER — Encounter: Payer: Self-pay | Admitting: Oncology

## 2021-02-24 ENCOUNTER — Emergency Department
Admission: EM | Admit: 2021-02-24 | Discharge: 2021-02-24 | Disposition: A | Discharge status: Home / Self Care | Payer: Medicare Other | Attending: Emergency Medicine | Admitting: Emergency Medicine

## 2021-02-24 ENCOUNTER — Emergency Department: Payer: Medicare Other

## 2021-02-24 VITALS — BP 102/69 | HR 125 | Temp 97.0°F | Resp 18

## 2021-02-24 DIAGNOSIS — D508 Other iron deficiency anemias: Secondary | ICD-10-CM

## 2021-02-24 DIAGNOSIS — F17210 Nicotine dependence, cigarettes, uncomplicated: Secondary | ICD-10-CM | POA: Diagnosis not present

## 2021-02-24 DIAGNOSIS — E876 Hypokalemia: Secondary | ICD-10-CM | POA: Diagnosis not present

## 2021-02-24 DIAGNOSIS — I25119 Atherosclerotic heart disease of native coronary artery with unspecified angina pectoris: Secondary | ICD-10-CM | POA: Diagnosis not present

## 2021-02-24 DIAGNOSIS — J45909 Unspecified asthma, uncomplicated: Secondary | ICD-10-CM | POA: Diagnosis not present

## 2021-02-24 DIAGNOSIS — R1110 Vomiting, unspecified: Secondary | ICD-10-CM

## 2021-02-24 DIAGNOSIS — R0602 Shortness of breath: Secondary | ICD-10-CM | POA: Diagnosis not present

## 2021-02-24 DIAGNOSIS — R079 Chest pain, unspecified: Secondary | ICD-10-CM

## 2021-02-24 DIAGNOSIS — Z79899 Other long term (current) drug therapy: Secondary | ICD-10-CM | POA: Insufficient documentation

## 2021-02-24 DIAGNOSIS — Z20822 Contact with and (suspected) exposure to covid-19: Secondary | ICD-10-CM | POA: Diagnosis not present

## 2021-02-24 DIAGNOSIS — Z7951 Long term (current) use of inhaled steroids: Secondary | ICD-10-CM | POA: Diagnosis not present

## 2021-02-24 DIAGNOSIS — R634 Abnormal weight loss: Secondary | ICD-10-CM | POA: Diagnosis not present

## 2021-02-24 DIAGNOSIS — R000 Tachycardia, unspecified: Secondary | ICD-10-CM | POA: Insufficient documentation

## 2021-02-24 DIAGNOSIS — R112 Nausea with vomiting, unspecified: Secondary | ICD-10-CM | POA: Insufficient documentation

## 2021-02-24 DIAGNOSIS — Z7982 Long term (current) use of aspirin: Secondary | ICD-10-CM | POA: Insufficient documentation

## 2021-02-24 DIAGNOSIS — J449 Chronic obstructive pulmonary disease, unspecified: Secondary | ICD-10-CM | POA: Insufficient documentation

## 2021-02-24 DIAGNOSIS — I10 Essential (primary) hypertension: Secondary | ICD-10-CM | POA: Insufficient documentation

## 2021-02-24 LAB — BASIC METABOLIC PANEL
Anion gap: 12 (ref 5–15)
BUN: 17 mg/dL (ref 6–20)
CO2: 29 mmol/L (ref 22–32)
Calcium: 8.4 mg/dL — ABNORMAL LOW (ref 8.9–10.3)
Chloride: 83 mmol/L — ABNORMAL LOW (ref 98–111)
Creatinine, Ser: 0.96 mg/dL (ref 0.61–1.24)
GFR, Estimated: >60 mL/min (ref >60)
Glucose, Bld: 127 mg/dL — ABNORMAL HIGH (ref 70–99)
Potassium: 2.4 mmol/L — CL (ref 3.5–5.1)
Sodium: 124 mmol/L — ABNORMAL LOW (ref 135–145)

## 2021-02-24 LAB — CBC
HCT: 35.5 % — ABNORMAL LOW (ref 39.0–52.0)
Hemoglobin: 12.8 g/dL — ABNORMAL LOW (ref 13.0–17.0)
MCH: 31.8 pg (ref 26.0–34.0)
MCHC: 36.1 g/dL — ABNORMAL HIGH (ref 30.0–36.0)
MCV: 88.3 fL (ref 80.0–100.0)
Platelets: 424 10*3/uL — ABNORMAL HIGH (ref 150–400)
RBC: 4.02 MIL/uL — ABNORMAL LOW (ref 4.22–5.81)
RDW: 12.9 % (ref 11.5–15.5)
WBC: 26 10*3/uL — ABNORMAL HIGH (ref 4.0–10.5)
nRBC: 0 % (ref 0.0–0.2)

## 2021-02-24 LAB — TROPONIN I (HIGH SENSITIVITY)
Troponin I (High Sensitivity): 8 ng/L (ref <18)
Troponin I (High Sensitivity): 8 ng/L (ref <18)

## 2021-02-24 LAB — RESP PANEL BY RT-PCR (FLU A&B, COVID) ARPGX2
Influenza A by PCR: NEGATIVE
Influenza B by PCR: NEGATIVE
SARS Coronavirus 2 by RT PCR: NEGATIVE

## 2021-02-24 MED ORDER — ONDANSETRON HCL 4 MG/2ML IJ SOLN
4.0000 mg | Freq: Once | INTRAMUSCULAR | Status: AC
  Administered 2021-02-24: 4 mg via INTRAVENOUS
  Filled 2021-02-24: qty 2

## 2021-02-24 MED ORDER — POTASSIUM CHLORIDE CRYS ER 20 MEQ PO TBCR
40.0000 meq | EXTENDED_RELEASE_TABLET | Freq: Once | ORAL | Status: AC
  Administered 2021-02-24: 40 meq via ORAL
  Filled 2021-02-24: qty 2

## 2021-02-24 MED ORDER — POTASSIUM CHLORIDE ER 20 MEQ PO TBCR: 40.0000 meq | EXTENDED_RELEASE_TABLET | Freq: Every day | ORAL | 0 refills | Status: DC

## 2021-02-24 MED ORDER — LACTATED RINGERS IV BOLUS
1000.0000 mL | Freq: Once | INTRAVENOUS | Status: AC
  Administered 2021-02-24: 1000 mL via INTRAVENOUS

## 2021-02-24 MED ORDER — SODIUM CHLORIDE 0.9 % IV SOLN
40.0000 meq | Freq: Once | INTRAVENOUS | Status: AC
  Administered 2021-02-24: 40 meq via INTRAVENOUS
  Filled 2021-02-24: qty 20

## 2021-02-24 MED ORDER — SODIUM CHLORIDE 0.9 % IV SOLN
Freq: Once | INTRAVENOUS | Status: AC
  Filled 2021-02-24: qty 250

## 2021-02-24 MED ORDER — POTASSIUM CHLORIDE 10 MEQ/100ML IV SOLN
10.0000 meq | Freq: Once | INTRAVENOUS | Status: AC
  Administered 2021-02-24: 10 meq via INTRAVENOUS
  Filled 2021-02-24: qty 100

## 2021-02-24 MED ORDER — IOHEXOL 350 MG/ML SOLN
80.0000 mL | Freq: Once | INTRAVENOUS | Status: AC | PRN
  Administered 2021-02-24: 75 mL via INTRAVENOUS

## 2021-02-24 NOTE — Discharge Instructions (Signed)
Please follow up with primary care and oncology.  Return to the ER for symptoms that change or worsen if unable to schedule an appointment.

## 2021-02-24 NOTE — ED Notes (Signed)
Patient transported to CT 

## 2021-02-24 NOTE — ED Triage Notes (Signed)
Pt to ED from cancer center for chest pain for a few days. 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

## 2021-02-24 NOTE — ED Notes (Signed)
Pt with c/o nausea,EDP notified,  pt given zofran per orders

## 2021-02-24 NOTE — Progress Notes (Signed)
1008: pt denies any changes since being seen in clinic on 02/22/21, pt denies any concerns at this time.  HR irregular and ranging between 30 and 128.  Dr. Grayland Ormond notifed.  1030: Pt reports that "I have hurt a little here over the last day or two" , pointing to the center of chest.  MD aware.  1042: Per Dr. Grayland Ormond stop Potassium and escort pt to ER  1050: Pt transferred to ER with staff assist via wheelchair, PIV remains in place and patent. Report given to Charge nurse.

## 2021-02-24 NOTE — ED Triage Notes (Signed)
FIRST NURSE NOTE: pt sent from the cancer center, states has had abnormal K+ and today they were concerned due to the pt having chest pain with a HR 20-120

## 2021-02-24 NOTE — ED Notes (Signed)
Pt resting quietly with his eyes closed, resp even and unlabored, wife at bedside

## 2021-02-24 NOTE — Telephone Encounter (Signed)
Message on Kevin Alexander's voicemail that the Dexamethasone Dr. Grayland Ormond sent is for nausea and he will not need to take Prednisone with Dexamethasone.
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
## 2021-02-25 ENCOUNTER — Telehealth: Payer: Self-pay | Admitting: *Deleted

## 2021-02-25 NOTE — Telephone Encounter (Signed)
He was sent to ER yesterday from clinic with chest pain and given IV fluids and k+ and evidentially sent home

## 2021-02-25 NOTE — Telephone Encounter (Signed)
Received a call from Marcie Bal who is very upset that patient has been discharged and the fact that his weight is down to 106 # due to the fact that patient has not eaten in over a month and he is very weak is not being addressed is upsetting her. She states that she wants him admitted solely to be fed or find the cause of him not eating. She mentioned that she does not know what to do short of calling the news to let them know how we are sending  our patients home, Hydrating him and  loading him up with potassium and sending him home. Please contact her

## 2021-03-01 ENCOUNTER — Encounter: Payer: Self-pay | Admitting: Oncology

## 2021-03-04 ENCOUNTER — Telehealth: Payer: Self-pay | Admitting: *Deleted

## 2021-03-04 NOTE — Telephone Encounter (Addendum)
Message received by secure chat that this patient is scheduled for CT 03/08/21, but just had one done while in ER 02/24/21 and was asking if patient needed to have this repeated. Message was sent to doctor/team and was told that patient does NOT need it repeated. I called central scheduling and cancelled the scan and attempted to call patient on both numbers listed and got voice mail on each and left message on both numbers that the CT has been cancelled and also left my number

## 2021-03-08 ENCOUNTER — Ambulatory Visit: Admission: RE | Admit: 2021-03-08 | Payer: Medicare Other | Source: Ambulatory Visit

## 2021-03-09 ENCOUNTER — Inpatient Hospital Stay: Payer: Medicare Other | Admitting: Oncology
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
## 2021-03-24 ENCOUNTER — Inpatient Hospital Stay: Payer: Medicare Other | Admitting: Oncology
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
## 2021-04-13 ENCOUNTER — Encounter: Payer: Self-pay | Admitting: Oncology

## 2021-04-13 ENCOUNTER — Other Ambulatory Visit: Payer: Self-pay

## 2021-04-13 ENCOUNTER — Inpatient Hospital Stay: Payer: Medicare Other | Attending: Oncology | Admitting: Oncology

## 2021-04-13 VITALS — BP 137/92 | HR 116 | Temp 97.9°F | Resp 18 | Wt 126.3 lb

## 2021-04-13 DIAGNOSIS — R5383 Other fatigue: Secondary | ICD-10-CM | POA: Insufficient documentation

## 2021-04-13 DIAGNOSIS — R197 Diarrhea, unspecified: Secondary | ICD-10-CM | POA: Insufficient documentation

## 2021-04-13 DIAGNOSIS — R110 Nausea: Secondary | ICD-10-CM | POA: Insufficient documentation

## 2021-04-13 DIAGNOSIS — R634 Abnormal weight loss: Secondary | ICD-10-CM | POA: Diagnosis present

## 2021-04-13 DIAGNOSIS — Z7952 Long term (current) use of systemic steroids: Secondary | ICD-10-CM | POA: Diagnosis not present

## 2021-04-13 MED ORDER — DEXAMETHASONE 4 MG PO TABS: 4.0000 mg | ORAL_TABLET | Freq: Every day | ORAL | 0 refills | Status: DC

## 2021-04-13 MED ORDER — ONDANSETRON 4 MG PO TBDP: 4.0000 mg | ORAL_TABLET | Freq: Three times a day (TID) | ORAL | 0 refills | Status: DC | PRN

## 2021-04-13 NOTE — Progress Notes (Signed)
Appetite has improved while taking Dexamethasone, wt gain of 26 lbs since 02/24/21.  Does not have energy, occasional vomiting, and diarrhea.  Bowel movement this morning produced a dark stool.  He has started taking the OTC iron pills for the past week.

## 2021-05-10 ENCOUNTER — Encounter: Payer: Self-pay | Admitting: Gastroenterology

## 2021-05-10 ENCOUNTER — Ambulatory Visit (INDEPENDENT_AMBULATORY_CARE_PROVIDER_SITE_OTHER): Payer: Medicare Other | Admitting: Gastroenterology

## 2021-05-10 VITALS — BP 147/89 | HR 112 | Ht 67.0 in | Wt 140.0 lb

## 2021-05-10 DIAGNOSIS — R112 Nausea with vomiting, unspecified: Secondary | ICD-10-CM | POA: Diagnosis not present
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
## 2021-05-18 ENCOUNTER — Other Ambulatory Visit: Payer: Self-pay | Admitting: Student in an Organized Health Care Education/Training Program

## 2021-05-18 ENCOUNTER — Ambulatory Visit
Admission: RE | Admit: 2021-05-18 | Discharge: 2021-05-18 | Disposition: A | Discharge status: Home / Self Care | Payer: Medicare Other | Source: Ambulatory Visit | Attending: Student in an Organized Health Care Education/Training Program | Admitting: Student in an Organized Health Care Education/Training Program

## 2021-05-18 ENCOUNTER — Ambulatory Visit (HOSPITAL_BASED_OUTPATIENT_CLINIC_OR_DEPARTMENT_OTHER): Payer: Medicare Other | Admitting: Student in an Organized Health Care Education/Training Program

## 2021-05-18 ENCOUNTER — Encounter: Payer: Self-pay | Admitting: Student in an Organized Health Care Education/Training Program

## 2021-05-18 ENCOUNTER — Other Ambulatory Visit: Payer: Self-pay

## 2021-05-18 VITALS — BP 141/80 | HR 95 | Temp 97.2°F | Resp 16 | Ht 67.0 in | Wt 148.5 lb

## 2021-05-18 DIAGNOSIS — G894 Chronic pain syndrome: Secondary | ICD-10-CM

## 2021-05-18 DIAGNOSIS — M792 Neuralgia and neuritis, unspecified: Secondary | ICD-10-CM | POA: Insufficient documentation

## 2021-05-18 DIAGNOSIS — G608 Other hereditary and idiopathic neuropathies: Secondary | ICD-10-CM | POA: Insufficient documentation

## 2021-05-18 DIAGNOSIS — T85192S Other mechanical complication of implanted electronic neurostimulator (electrode) of spinal cord, sequela: Secondary | ICD-10-CM

## 2021-05-18 MED ORDER — ORPHENADRINE CITRATE 30 MG/ML IJ SOLN
INTRAMUSCULAR | Status: AC
  Filled 2021-05-18: qty 2

## 2021-05-18 MED ORDER — KETOROLAC TROMETHAMINE 30 MG/ML IJ SOLN
INTRAMUSCULAR | Status: AC
  Filled 2021-05-18: qty 1

## 2021-05-18 MED ORDER — KETOROLAC TROMETHAMINE 30 MG/ML IJ SOLN
30.0000 mg | Freq: Once | INTRAMUSCULAR | Status: AC
  Administered 2021-05-18: 30 mg via INTRAMUSCULAR

## 2021-05-18 MED ORDER — ORPHENADRINE CITRATE 30 MG/ML IJ SOLN
30.0000 mg | Freq: Once | INTRAMUSCULAR | Status: AC
  Administered 2021-05-18: 30 mg via INTRAMUSCULAR

## 2021-05-18 NOTE — Progress Notes (Signed)
Safety precautions to be maintained throughout the outpatient stay will include: orient to surroundings, keep bed in low position, maintain call bell within reach at all times, provide assistance with transfer out of bed and ambulation.  
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
## 2021-05-18 NOTE — Patient Instructions (Signed)
You will have x rays completed prior to next visit with pain clinic.

## 2021-05-19 ENCOUNTER — Telehealth: Payer: Self-pay

## 2021-05-19 NOTE — Telephone Encounter (Signed)
Patient left a vm stating he is hurting a lot and hasn't slept in 48 hours.
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
## 2021-05-24 ENCOUNTER — Encounter: Payer: Self-pay | Admitting: Oncology

## 2021-05-26 ENCOUNTER — Ambulatory Visit
Payer: Medicare Other | Attending: Student in an Organized Health Care Education/Training Program | Admitting: Student in an Organized Health Care Education/Training Program

## 2021-05-26 ENCOUNTER — Encounter: Payer: Self-pay | Admitting: Student in an Organized Health Care Education/Training Program

## 2021-05-26 ENCOUNTER — Other Ambulatory Visit: Payer: Self-pay

## 2021-05-26 VITALS — BP 143/91 | HR 121 | Temp 97.2°F | Resp 16 | Ht 67.0 in | Wt 149.0 lb

## 2021-05-26 DIAGNOSIS — T85122A Displacement of implanted electronic neurostimulator (electrode) of spinal cord, initial encounter: Secondary | ICD-10-CM | POA: Insufficient documentation

## 2021-05-26 DIAGNOSIS — M792 Neuralgia and neuritis, unspecified: Secondary | ICD-10-CM | POA: Diagnosis present

## 2021-05-26 DIAGNOSIS — G894 Chronic pain syndrome: Secondary | ICD-10-CM | POA: Insufficient documentation

## 2021-05-26 DIAGNOSIS — T85192S Other mechanical complication of implanted electronic neurostimulator (electrode) of spinal cord, sequela: Secondary | ICD-10-CM | POA: Insufficient documentation

## 2021-05-26 DIAGNOSIS — G608 Other hereditary and idiopathic neuropathies: Secondary | ICD-10-CM | POA: Insufficient documentation

## 2021-05-26 LAB — COMPLIANCE DRUG ANALYSIS, UR

## 2021-05-26 MED ORDER — TRAMADOL HCL 50 MG PO TABS: 50.0000 mg | ORAL_TABLET | Freq: Four times a day (QID) | ORAL | 1 refills | Status: DC | PRN

## 2021-05-26 MED ORDER — TRAMADOL HCL 50 MG PO TABS: 50.0000 mg | ORAL_TABLET | Freq: Four times a day (QID) | ORAL | 1 refills | Status: AC | PRN

## 2021-05-26 NOTE — Patient Instructions (Signed)
Sign pain contract.
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
## 2021-05-26 NOTE — Progress Notes (Signed)
Safety precautions to be maintained throughout the outpatient stay will include: orient to surroundings, keep bed in low position, maintain call bell within reach at all times, provide assistance with transfer out of bed and ambulation.  

## 2021-05-30 ENCOUNTER — Other Ambulatory Visit: Payer: Self-pay

## 2021-05-30 ENCOUNTER — Ambulatory Visit (INDEPENDENT_AMBULATORY_CARE_PROVIDER_SITE_OTHER): Payer: Medicare Other | Admitting: Cardiology

## 2021-05-30 ENCOUNTER — Encounter: Payer: Self-pay | Admitting: Cardiology

## 2021-05-30 VITALS — BP 132/82 | HR 120 | Ht 67.0 in | Wt 146.0 lb

## 2021-05-30 DIAGNOSIS — E782 Mixed hyperlipidemia: Secondary | ICD-10-CM | POA: Diagnosis not present

## 2021-05-30 DIAGNOSIS — I10 Essential (primary) hypertension: Secondary | ICD-10-CM

## 2021-05-30 DIAGNOSIS — F17200 Nicotine dependence, unspecified, uncomplicated: Secondary | ICD-10-CM

## 2021-05-30 DIAGNOSIS — Q231 Congenital insufficiency of aortic valve: Secondary | ICD-10-CM | POA: Diagnosis not present

## 2021-05-30 MED ORDER — DILTIAZEM HCL ER COATED BEADS 120 MG PO CP24: 120.0000 mg | ORAL_CAPSULE | Freq: Every day | ORAL | 3 refills | Status: DC

## 2021-05-30 NOTE — Patient Instructions (Signed)
Medication Instructions:  Your physician has recommended you make the following change in your medication:   START Cardizem CD 120 mg daily   *If you need a refill on your cardiac medications before your next appointment, please call your pharmacy*   Lab Work: None ordered If you have labs (blood work) drawn today and your tests are completely normal, you will receive your results only by: [place] (if you have MyChart) OR A paper copy in the mail If you have any lab test that is abnormal or we need to change your treatment, we will call you to review the results.   Testing/Procedures: Echocardiogram - Your physician has requested that you have an echocardiogram. Echocardiography is a painless test that uses sound waves to create images of your heart. It provides your doctor with information about the size and shape of your heart and how well your heart's chambers and valves are working. This procedure takes approximately one hour. There are no restrictions for this procedure.   Follow-Up: At [facility], you and your health needs are our priority.  As part of our continuing mission to provide you with exceptional heart care, we have created designated Provider Care Teams.  These Care Teams include your primary Cardiologist (physician) and Advanced Practice Providers (APPs -  Physician Assistants and Nurse Practitioners) who all work together to provide you with the care you need, when you need it.  We recommend signing up for the patient portal called "MyChart".  Sign up information is provided on this After Visit Summary.  MyChart is used to connect with patients for Virtual Visits (Telemedicine).  Patients are able to view lab/test results, encounter notes, upcoming appointments, etc.  Non-urgent messages can be sent to your provider as well.   To learn more about what you can do with MyChart, go to [email or web address].    Your next appointment:   2 month(s)  The format  for your next appointment:   In Person  Provider:   You may see Kate Sable, MD or one of the following Advanced Practice Providers on your designated Care Team:   Murray Hodgkins, NP Christell Faith, PA-C Cadence Kathlen Mody, PA-C  :1}    Other Instructions N/A
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
## 2021-05-31 ENCOUNTER — Other Ambulatory Visit: Payer: Self-pay | Admitting: Neurosurgery

## 2021-05-31 DIAGNOSIS — Z01818 Encounter for other preprocedural examination: Secondary | ICD-10-CM

## 2021-06-02 ENCOUNTER — Other Ambulatory Visit
Admission: RE | Admit: 2021-06-02 | Discharge: 2021-06-02 | Disposition: A | Discharge status: Home / Self Care | Payer: Medicare Other | Source: Ambulatory Visit | Attending: Neurosurgery | Admitting: Neurosurgery

## 2021-06-02 ENCOUNTER — Other Ambulatory Visit: Payer: Self-pay

## 2021-06-02 ENCOUNTER — Encounter: Payer: Self-pay | Admitting: Neurosurgery

## 2021-06-02 ENCOUNTER — Telehealth: Payer: Self-pay | Admitting: *Deleted

## 2021-06-02 DIAGNOSIS — Z01818 Encounter for other preprocedural examination: Secondary | ICD-10-CM | POA: Diagnosis not present

## 2021-06-02 HISTORY — DX: Personal history of other diseases of the digestive system: Z87.19

## 2021-06-02 LAB — CBC
HCT: 45.4 % (ref 39.0–52.0)
Hemoglobin: 14.6 g/dL (ref 13.0–17.0)
MCH: 31.8 pg (ref 26.0–34.0)
MCHC: 32.2 g/dL (ref 30.0–36.0)
MCV: 98.9 fL (ref 80.0–100.0)
Platelets: 373 10*3/uL (ref 150–400)
RBC: 4.59 MIL/uL (ref 4.22–5.81)
RDW: 14.2 % (ref 11.5–15.5)
WBC: 17.3 10*3/uL — ABNORMAL HIGH (ref 4.0–10.5)
nRBC: 0 % (ref 0.0–0.2)

## 2021-06-02 LAB — BASIC METABOLIC PANEL
Anion gap: 11 (ref 5–15)
BUN: 12 mg/dL (ref 6–20)
CO2: 22 mmol/L (ref 22–32)
Calcium: 9.2 mg/dL (ref 8.9–10.3)
Chloride: 100 mmol/L (ref 98–111)
Creatinine, Ser: 1.4 mg/dL — ABNORMAL HIGH (ref 0.61–1.24)
GFR, Estimated: 59 mL/min — ABNORMAL LOW (ref >60)
Glucose, Bld: 246 mg/dL — ABNORMAL HIGH (ref 70–99)
Potassium: 4 mmol/L (ref 3.5–5.1)
Sodium: 133 mmol/L — ABNORMAL LOW (ref 135–145)
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
## 2021-06-02 NOTE — Telephone Encounter (Addendum)
° °  Patient Name: [name]  DOB: 29-Sep-1965 MRN: [id number]  Primary Cardiologist: Dr. Garen Lah  Chart reviewed as part of pre-operative protocol coverage. Patient was just recently seen earlier this week to establish care. Reported SOB at that time, also has hx of bicuspid AV. EKG reported to show sinus tachycardia, HR 120. 2D echo planned to start for further evaluation. Cardiology asked to provide clearance for general anesthesia for revision of stimulator electrode 06/06/21. Per d/w Honor Loh NP with peri-op team, patient has already been holding ASA since 06/01/21. Given recent symptoms and abnormal VS at OV with further w/u planned I am unable to clear without MD input. I reviewed with Dr. Garen Lah via secure chat. Per his recommendation, "Outside echo earlier this year showed normal EF, procedure is low risk from a cardiac perspective. ok to proceed with procedure." No contraindication to holding ASA based on prior history at this time. Will route this bundled recommendation to requesting provider via Epic fax function. Please call with questions.  Charlie Pitter, PA-C 06/02/2021, 2:29 PM

## 2021-06-02 NOTE — Telephone Encounter (Signed)
Request for pre-operative cardiac clearance Received: Today Karen Kitchens, NP  P Cv Div Preop Callback Request for pre-operative cardiac clearance:     1. What type of surgery is being performed?  REVISION STIMULATOR ELECTRODE ([employer])   2. When is this surgery scheduled?  06/06/2021     3. Are there any medications that need to be held prior to surgery?  ASA   4. Practice name and name of physician performing surgery?  Performing surgeon: Dr. Deetta Perla, MD  Requesting clearance: Honor Loh, FNP-C       5. Anesthesia type (none, local, MAC, general)? General   6. What is the office phone and fax number?    Phone: [phone]  Fax: [phone]   ATTENTION: Unable to create telephone message as per your standard workflow. Directed by [facility] providers to send requests for cardiac clearance to this pool for appropriate distribution to provider covering pre-operative clearances.   Honor Loh, MSN, APRN, FNP-C, CEN  [facility]  Peri-operative Services Nurse Practitioner  Phone: [phone]  06/02/21 12:13 PM

## 2021-06-02 NOTE — Patient Instructions (Addendum)
Your procedure is scheduled on:06-06-21 Monday Report to the Registration Desk on the 1st floor of the [facility].Then proceed to the 2nd floor Surgery Desk in the [place] To find out your arrival time, please call [phone] between 1PM - 3PM on:06-03-21 Friday  REMEMBER: Instructions that are not followed completely may result in serious medical risk, up to and including death; or upon the discretion of your surgeon and anesthesiologist your surgery may need to be rescheduled.  Do not eat food after midnight the night before surgery.  No gum chewing, lozengers or hard candies.  You may however, drink CLEAR liquids up to 2 hours before you are scheduled to arrive for your surgery. Do not drink anything within 2 hours of your scheduled arrival time.  Clear liquids include: - water  - apple juice without pulp - gatorade (not RED, PURPLE, OR BLUE) - black coffee or tea (Do NOT add milk or creamers to the coffee or tea) Do NOT drink anything that is not on this list.  TAKE THESE MEDICATIONS THE MORNING OF SURGERY WITH A SIP OF WATER: -atorvastatin (LIPITOR)  -diltiazem (CARDIZEM CD) -DULoxetine (CYMBALTA)  -omeprazole (PRILOSEC)-take one the night before and one on the morning of surgery - helps to prevent nausea after surgery.)  Use your Albuterol Nebulizer the day of surgery  Instructed by Dr Jonathon Jordan office to stop Aspirin 7 days prior to surgery-Last dose was on 06-01-21  One week prior to surgery: Stop Anti-inflammatories (NSAIDS) such as Advil, Aleve, Ibuprofen, Motrin, Naproxen, Naprosyn and Aspirin based products such as Excedrin, Goodys Powder, BC Powder.You may however, continue to take Tylenol if needed for pain up until the day of surgery.  Stop ANY OVER THE COUNTER supplements/vitamins NOW (06-02-21) until after surgery (VITAMIN D3, Ferrous Sulfate ,  Multiple Vitamin (MULTIVITAMIN), thiamine (VITAMIN B-1), Ferrous Sulfate No Alcohol for 24 hours before or after  surgery.  No Smoking including e-cigarettes for 24 hours prior to surgery.  No chewable tobacco products for at least 6 hours prior to surgery.  No nicotine patches on the day of surgery.  Do not use any "recreational" drugs for at least a week prior to your surgery.  Please be advised that the combination of cocaine and anesthesia may have negative outcomes, up to and including death. If you test positive for cocaine, your surgery will be cancelled.  On the morning of surgery brush your teeth with toothpaste and water, you may rinse your mouth with mouthwash if you wish. Do not swallow any toothpaste or mouthwash.  Use CHG Soap as directed on instruction sheet.  Do not wear jewelry, make-up, hairpins, clips or nail polish.  Do not wear lotions, powders, or perfumes.   Do not shave body from the neck down 48 hours prior to surgery just in case you cut yourself which could leave a site for infection.  Also, freshly shaved skin may become irritated if using the CHG soap.  Contact lenses, hearing aids and dentures may not be worn into surgery.  Do not bring valuables to the hospital. [facility] is not responsible for any missing/lost belongings or valuables.   Notify your doctor if there is any change in your medical condition (cold, fever, infection).  Wear comfortable clothing (specific to your surgery type) to the hospital.  After surgery, you can help prevent lung complications by doing breathing exercises.  Take deep breaths and cough every 1-2 hours. Your doctor may order a device called an Incentive Spirometer to help  you take deep breaths. When coughing or sneezing, hold a pillow firmly against your incision with both hands. This is called splinting. Doing this helps protect your incision. It also decreases belly discomfort.  If you are being admitted to the hospital overnight, leave your suitcase in the car. After surgery it may be brought to your room.  If you are being  discharged the day of surgery, you will not be allowed to drive home. You will need a responsible adult (18 years or older) to drive you home and stay with you that night.   If you are taking public transportation, you will need to have a responsible adult (18 years or older) with you. Please confirm with your physician that it is acceptable to use public transportation.   Please call the [facility] Dept. at [phone] if you have any questions about these instructions.  Surgery Visitation Policy:  Patients undergoing a surgery or procedure may have one family member or support person with them as long as that person is not COVID-19 positive or experiencing its symptoms.  That person may remain in the waiting area during the procedure and may rotate out with other people.  Inpatient Visitation:    Visiting hours are 7 a.m. to 8 p.m. Up to two visitors ages 16+ are allowed at one time in a patient room. The visitors may rotate out with other people during the day. Visitors must check out when they leave, or other visitors will not be allowed. One designated support person may remain overnight. The visitor must pass COVID-19 screenings, use hand sanitizer when entering and exiting the patients room and wear a mask at all times, including in the patients room. Patients must also wear a mask when staff or their visitor are in the room. Masking is required regardless of vaccination status.

## 2021-06-06 ENCOUNTER — Ambulatory Visit
Admission: RE | Admit: 2021-06-06 | Discharge: 2021-06-06 | Disposition: A | Discharge status: Home / Self Care | Payer: Medicare Other | Attending: Neurosurgery | Admitting: Neurosurgery

## 2021-06-06 ENCOUNTER — Other Ambulatory Visit: Payer: Self-pay

## 2021-06-06 ENCOUNTER — Ambulatory Visit: Payer: Medicare Other | Admitting: Urgent Care

## 2021-06-06 ENCOUNTER — Encounter
Admission: RE | Disposition: A | Discharge status: Home / Self Care | Payer: Self-pay | Source: Home / Self Care | Attending: Neurosurgery

## 2021-06-06 ENCOUNTER — Ambulatory Visit: Payer: Medicare Other

## 2021-06-06 DIAGNOSIS — G894 Chronic pain syndrome: Secondary | ICD-10-CM | POA: Insufficient documentation

## 2021-06-06 DIAGNOSIS — K219 Gastro-esophageal reflux disease without esophagitis: Secondary | ICD-10-CM | POA: Diagnosis not present

## 2021-06-06 DIAGNOSIS — I25119 Atherosclerotic heart disease of native coronary artery with unspecified angina pectoris: Secondary | ICD-10-CM | POA: Insufficient documentation

## 2021-06-06 DIAGNOSIS — G4733 Obstructive sleep apnea (adult) (pediatric): Secondary | ICD-10-CM | POA: Diagnosis not present

## 2021-06-06 DIAGNOSIS — J449 Chronic obstructive pulmonary disease, unspecified: Secondary | ICD-10-CM | POA: Diagnosis not present

## 2021-06-06 DIAGNOSIS — F32A Depression, unspecified: Secondary | ICD-10-CM | POA: Diagnosis not present

## 2021-06-06 DIAGNOSIS — Z4549 Encounter for adjustment and management of other implanted nervous system device: Secondary | ICD-10-CM | POA: Insufficient documentation

## 2021-06-06 DIAGNOSIS — R0602 Shortness of breath: Secondary | ICD-10-CM | POA: Diagnosis not present

## 2021-06-06 DIAGNOSIS — M1990 Unspecified osteoarthritis, unspecified site: Secondary | ICD-10-CM | POA: Diagnosis not present

## 2021-06-06 DIAGNOSIS — F17210 Nicotine dependence, cigarettes, uncomplicated: Secondary | ICD-10-CM | POA: Diagnosis not present

## 2021-06-06 DIAGNOSIS — G608 Other hereditary and idiopathic neuropathies: Secondary | ICD-10-CM | POA: Insufficient documentation

## 2021-06-06 DIAGNOSIS — Z419 Encounter for procedure for purposes other than remedying health state, unspecified: Secondary | ICD-10-CM

## 2021-06-06 DIAGNOSIS — G2581 Restless legs syndrome: Secondary | ICD-10-CM | POA: Insufficient documentation

## 2021-06-06 DIAGNOSIS — Q231 Congenital insufficiency of aortic valve: Secondary | ICD-10-CM | POA: Insufficient documentation

## 2021-06-06 DIAGNOSIS — I119 Hypertensive heart disease without heart failure: Secondary | ICD-10-CM | POA: Diagnosis not present

## 2021-06-06 DIAGNOSIS — Z8711 Personal history of peptic ulcer disease: Secondary | ICD-10-CM | POA: Diagnosis not present

## 2021-06-06 DIAGNOSIS — D649 Anemia, unspecified: Secondary | ICD-10-CM | POA: Insufficient documentation

## 2021-06-06 DIAGNOSIS — G4730 Sleep apnea, unspecified: Secondary | ICD-10-CM | POA: Insufficient documentation

## 2021-06-06 DIAGNOSIS — D759 Disease of blood and blood-forming organs, unspecified: Secondary | ICD-10-CM | POA: Insufficient documentation

## 2021-06-06 HISTORY — DX: Other chronic pain: G89.29

## 2021-06-06 HISTORY — DX: Presence of dental prosthetic device (complete) (partial): Z97.2

## 2021-06-06 HISTORY — DX: Congenital insufficiency of aortic valve: Q23.1

## 2021-06-06 HISTORY — PX: SPINAL CORD STIMULATOR INSERTION: SHX5378

## 2021-06-06 HISTORY — DX: Bicuspid aortic valve: Q23.81

## 2021-06-06 HISTORY — DX: Complete loss of teeth, unspecified cause, unspecified class: K08.109

## 2021-06-06 HISTORY — DX: Other ill-defined heart diseases: I51.89

## 2021-06-06 SURGERY — INSERTION, SPINAL CORD STIMULATOR, LUMBAR
Anesthesia: General

## 2021-06-06 MED ORDER — EPHEDRINE 5 MG/ML INJ
INTRAVENOUS | Status: AC
  Filled 2021-06-06: qty 5

## 2021-06-06 MED ORDER — PHENYLEPHRINE HCL (PRESSORS) 10 MG/ML IV SOLN
INTRAVENOUS | Status: DC | PRN
  Administered 2021-06-06 (×2): 120 ug via INTRAVENOUS
  Administered 2021-06-06: 160 ug via INTRAVENOUS
  Administered 2021-06-06: 80 ug via INTRAVENOUS

## 2021-06-06 MED ORDER — FENTANYL CITRATE (PF) 100 MCG/2ML IJ SOLN: 25.0000 ug | INTRAMUSCULAR | Status: DC | PRN

## 2021-06-06 MED ORDER — OXYCODONE HCL 5 MG PO TABS: 5.0000 mg | ORAL_TABLET | Freq: Three times a day (TID) | ORAL | 0 refills | Status: AC | PRN

## 2021-06-06 MED ORDER — KETAMINE HCL 50 MG/5ML IJ SOSY
PREFILLED_SYRINGE | INTRAMUSCULAR | Status: AC
  Filled 2021-06-06: qty 5

## 2021-06-06 MED ORDER — MIDAZOLAM HCL 2 MG/2ML IJ SOLN
INTRAMUSCULAR | Status: DC | PRN
  Administered 2021-06-06: 2 mg via INTRAVENOUS

## 2021-06-06 MED ORDER — BUPIVACAINE-EPINEPHRINE (PF) 0.5% -1:200000 IJ SOLN
INTRAMUSCULAR | Status: DC | PRN
  Administered 2021-06-06: 8 mL

## 2021-06-06 MED ORDER — VANCOMYCIN HCL 1000 MG IV SOLR
INTRAVENOUS | Status: DC | PRN
  Administered 2021-06-06: 1000 mg via TOPICAL

## 2021-06-06 MED ORDER — LACTATED RINGERS IV SOLN: INTRAVENOUS | Status: DC

## 2021-06-06 MED ORDER — ACETAMINOPHEN 10 MG/ML IV SOLN
INTRAVENOUS | Status: AC
  Filled 2021-06-06: qty 100

## 2021-06-06 MED ORDER — LIDOCAINE HCL (CARDIAC) PF 100 MG/5ML IV SOSY
PREFILLED_SYRINGE | INTRAVENOUS | Status: DC | PRN
  Administered 2021-06-06: 100 mg via INTRAVENOUS

## 2021-06-06 MED ORDER — DEXAMETHASONE SODIUM PHOSPHATE 10 MG/ML IJ SOLN
INTRAMUSCULAR | Status: DC | PRN
  Administered 2021-06-06: 5 mg via INTRAVENOUS

## 2021-06-06 MED ORDER — EPHEDRINE SULFATE 50 MG/ML IJ SOLN
INTRAMUSCULAR | Status: DC | PRN
  Administered 2021-06-06: 5 mg via INTRAVENOUS

## 2021-06-06 MED ORDER — CHLORHEXIDINE GLUCONATE 0.12 % MT SOLN
OROMUCOSAL | Status: AC
  Filled 2021-06-06: qty 15

## 2021-06-06 MED ORDER — DEXAMETHASONE SODIUM PHOSPHATE 10 MG/ML IJ SOLN
INTRAMUSCULAR | Status: AC
  Filled 2021-06-06: qty 1

## 2021-06-06 MED ORDER — ACETAMINOPHEN 10 MG/ML IV SOLN
INTRAVENOUS | Status: DC | PRN
  Administered 2021-06-06: 1000 mg via INTRAVENOUS

## 2021-06-06 MED ORDER — MIDAZOLAM HCL 2 MG/2ML IJ SOLN
INTRAMUSCULAR | Status: AC
  Filled 2021-06-06: qty 2

## 2021-06-06 MED ORDER — ONDANSETRON HCL 4 MG/2ML IJ SOLN: 4.0000 mg | Freq: Once | INTRAMUSCULAR | Status: DC | PRN

## 2021-06-06 MED ORDER — CEFAZOLIN SODIUM-DEXTROSE 2-3 GM-%(50ML) IV SOLR
INTRAVENOUS | Status: DC | PRN
  Administered 2021-06-06: 2 g via INTRAVENOUS

## 2021-06-06 MED ORDER — CHLORHEXIDINE GLUCONATE 0.12 % MT SOLN
15.0000 mL | Freq: Once | OROMUCOSAL | Status: AC
  Administered 2021-06-06: 12:00:00 15 mL via OROMUCOSAL

## 2021-06-06 MED ORDER — SUCCINYLCHOLINE CHLORIDE 200 MG/10ML IV SOSY
PREFILLED_SYRINGE | INTRAVENOUS | Status: DC | PRN
  Administered 2021-06-06: 100 mg via INTRAVENOUS

## 2021-06-06 MED ORDER — SUCCINYLCHOLINE CHLORIDE 200 MG/10ML IV SOSY
PREFILLED_SYRINGE | INTRAVENOUS | Status: AC
  Filled 2021-06-06: qty 10

## 2021-06-06 MED ORDER — FENTANYL CITRATE (PF) 100 MCG/2ML IJ SOLN
INTRAMUSCULAR | Status: DC | PRN
  Administered 2021-06-06 (×2): 50 ug via INTRAVENOUS

## 2021-06-06 MED ORDER — 0.9 % SODIUM CHLORIDE (POUR BTL) OPTIME
TOPICAL | Status: DC | PRN
  Administered 2021-06-06: 13:00:00 1000 mL

## 2021-06-06 MED ORDER — CEFAZOLIN SODIUM-DEXTROSE 2-4 GM/100ML-% IV SOLN
INTRAVENOUS | Status: AC
  Filled 2021-06-06: qty 100

## 2021-06-06 MED ORDER — VANCOMYCIN HCL 1000 MG IV SOLR
INTRAVENOUS | Status: AC
  Filled 2021-06-06: qty 20

## 2021-06-06 MED ORDER — ONDANSETRON HCL 4 MG/2ML IJ SOLN
INTRAMUSCULAR | Status: AC
  Filled 2021-06-06: qty 2

## 2021-06-06 MED ORDER — PROPOFOL 10 MG/ML IV BOLUS
INTRAVENOUS | Status: AC
  Filled 2021-06-06: qty 20

## 2021-06-06 MED ORDER — KETAMINE HCL 10 MG/ML IJ SOLN
INTRAMUSCULAR | Status: DC | PRN
  Administered 2021-06-06: 30 mg via INTRAVENOUS

## 2021-06-06 MED ORDER — FENTANYL CITRATE (PF) 100 MCG/2ML IJ SOLN
INTRAMUSCULAR | Status: AC
  Filled 2021-06-06: qty 2

## 2021-06-06 MED ORDER — ROCURONIUM BROMIDE 10 MG/ML (PF) SYRINGE
PREFILLED_SYRINGE | INTRAVENOUS | Status: AC
  Filled 2021-06-06: qty 10

## 2021-06-06 MED ORDER — ORAL CARE MOUTH RINSE: 15.0000 mL | Freq: Once | OROMUCOSAL | Status: AC

## 2021-06-06 MED ORDER — PHENYLEPHRINE HCL-NACL 20-0.9 MG/250ML-% IV SOLN
INTRAVENOUS | Status: DC | PRN
  Administered 2021-06-06: 25 ug/min via INTRAVENOUS

## 2021-06-06 MED ORDER — ONDANSETRON HCL 4 MG/2ML IJ SOLN
INTRAMUSCULAR | Status: DC | PRN
  Administered 2021-06-06: 4 mg via INTRAVENOUS

## 2021-06-06 MED ORDER — ROCURONIUM BROMIDE 100 MG/10ML IV SOLN
INTRAVENOUS | Status: DC | PRN
  Administered 2021-06-06: 5 mg via INTRAVENOUS

## 2021-06-06 MED ORDER — PROPOFOL 10 MG/ML IV BOLUS
INTRAVENOUS | Status: DC | PRN
  Administered 2021-06-06: 30 mg via INTRAVENOUS
  Administered 2021-06-06: 140 mg via INTRAVENOUS

## 2021-06-06 SURGICAL SUPPLY — 54 items
ADH SKN CLS APL DERMABOND .7 (GAUZE/BANDAGES/DRESSINGS) ×2
ANCH LD 4 SETX2 CLIK X (Stimulator) ×1 IMPLANT
ANCHOR CLIK X NEURO (Stimulator) ×2 IMPLANT
APL PRP STRL LF DISP 70% ISPRP (MISCELLANEOUS) ×2
CHLORAPREP W/TINT 26 (MISCELLANEOUS) ×6 IMPLANT
COUNTER NEEDLE 20/40 LG (NEEDLE) ×3 IMPLANT
COVER LIGHT HANDLE STERIS (MISCELLANEOUS) ×6 IMPLANT
DERMABOND ADVANCED (GAUZE/BANDAGES/DRESSINGS) ×4
DERMABOND ADVANCED .7 DNX12 (GAUZE/BANDAGES/DRESSINGS) IMPLANT
DRAPE C-ARM XRAY 36X54 (DRAPES) ×4 IMPLANT
DRAPE C-ARMOR (DRAPES) ×2 IMPLANT
DRAPE LAPAROTOMY 100X77 ABD (DRAPES) ×3 IMPLANT
DRAPE SURG 17X11 SM STRL (DRAPES) ×1 IMPLANT
DRSG OPSITE POSTOP 4X6 (GAUZE/BANDAGES/DRESSINGS) ×4 IMPLANT
DRSG TEGADERM 4X4.75 (GAUZE/BANDAGES/DRESSINGS) ×3 IMPLANT
ELECT CAUTERY BLADE TIP 2.5 (TIP) ×3
ELECTRODE CAUTERY BLDE TIP 2.5 (TIP) ×1 IMPLANT
GAUZE 4X4 16PLY ~~LOC~~+RFID DBL (SPONGE) ×3 IMPLANT
GAUZE SPONGE 4X4 12PLY STRL (GAUZE/BANDAGES/DRESSINGS) ×3 IMPLANT
GLOVE SRG 8 PF TXTR STRL LF DI (GLOVE) ×1 IMPLANT
GLOVE SURG SYN 6.5 ES PF (GLOVE) ×6 IMPLANT
GLOVE SURG SYN 6.5 PF PI (GLOVE) ×2 IMPLANT
GLOVE SURG SYN 8.0 (GLOVE) ×3 IMPLANT
GLOVE SURG SYN 8.0 PF PI (GLOVE) ×1 IMPLANT
GLOVE SURG UNDER POLY LF SZ6.5 (GLOVE) ×6 IMPLANT
GLOVE SURG UNDER POLY LF SZ8 (GLOVE) ×3
GOWN SRG LRG LVL 4 IMPRV REINF (GOWNS) ×2 IMPLANT
GOWN STRL REIN LRG LVL4 (GOWNS) ×6
GRADUATE 1200CC STRL 31836 (MISCELLANEOUS) ×3 IMPLANT
Insertion Needle 10cm (4 Inch) ×2 IMPLANT
KIT TURNOVER KIT A (KITS) ×3 IMPLANT
LEAD BLANK NEURO SET/2 ×2 IMPLANT
MANIFOLD NEPTUNE II (INSTRUMENTS) ×3 IMPLANT
MARKER SKIN DUAL TIP RULER LAB (MISCELLANEOUS) ×5 IMPLANT
NDL NEURO SPARE INSERTION 4 (NEUROSURGERY SUPPLIES) IMPLANT
NEEDLE NEURO SPARE INSERTION 4 (NEUROSURGERY SUPPLIES) ×3 IMPLANT
NS IRRIG 1000ML POUR BTL (IV SOLUTION) ×3 IMPLANT
PACK LAMINECTOMY NEURO (CUSTOM PROCEDURE TRAY) ×3 IMPLANT
PAD ARMBOARD 7.5X6 YLW CONV (MISCELLANEOUS) ×7 IMPLANT
POSITIONER HEAD PRONE TRACH (MISCELLANEOUS) ×2 IMPLANT
Precsion Lead Blank ×2 IMPLANT
SPONGE T-LAP 4X18 ~~LOC~~+RFID (SPONGE) ×1 IMPLANT
STYLET KIT W/CAP 50 (NEUROSURGERY SUPPLIES) ×4 IMPLANT
SUT ETHILON 3-0 FS-10 30 BLK (SUTURE)
SUT POLYSORB 2-0 5X18 GS-10 (SUTURE) ×10 IMPLANT
SUT SILK 2 0 SH (SUTURE) ×11 IMPLANT
SUT VIC AB 0 CT1 18XCR BRD 8 (SUTURE) ×1 IMPLANT
SUT VIC AB 0 CT1 8-18 (SUTURE) ×3
SUTURE EHLN 3-0 FS-10 30 BLK (SUTURE) ×2 IMPLANT
Stylet Kit with Stylet Cap 50cm ×4 IMPLANT
TOOL LONG TUNNEL (SPINAL CORD STIMULATOR) ×2 IMPLANT
TOWEL OR 17X26 4PK STRL BLUE (TOWEL DISPOSABLE) ×6 IMPLANT
WATER STERILE IRR 500ML POUR (IV SOLUTION) ×1 IMPLANT
WRENCH HEX 7.6 (SPINAL CORD STIMULATOR) ×2 IMPLANT
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
## 2021-06-06 NOTE — Op Note (Signed)
Operative Note   SURGERY DATE:  06/06/2021   PRE-OP DIAGNOSIS:  Chronic pain syndrome   POST-OP DIAGNOSIS: Post-Op Diagnosis Codes: Chronic pain syndrome   Procedure(s) with comments: Revision Stimulator Lead   SURGEON:     * Malen Gauze, MD      Cooper Render, PA Assistant   ANESTHESIA: General    OPERATIVE FINDINGS: Successful revision of thoracic SCS lead   Indication Mr Celona was seen in clinic on 12/13 after having a successful thoracic SCS placed in February. He had gotten decreased relief and xray showed migration of the right sided lead.  The patient wished to proceed to revision to decrease medication use and achieve better pain control. Risks including weakness, hematoma, infection, failure of pain relief, post-operative pain, need for revision, stroke, heart attack, pneumonia, and spinal cord injury were discussed.    Procedure The patient was brought to the operating room where vascular access was obtained and intubated by the anesthesia service. Antibiotics were given.  The patient was turned prone onto gel rolls.The patient was prepped and draped in a sterile fashion. A hard time out was performed. Local anesthetic was instilled into incisions.   The flank incision was opened and the battery exposed protecting the wires. Next, the lumbar incision was opened sharply and the wires and anchors dissected. The right sided lead was freed from the sutures and the anchor and was removed. Next, a tuohy needle was inserted at the L3/4 level towards the epidural space. Once loss of resistance was seen, a lead was inserted in the rostral direction just to the right of midline up to the T12 level. Once in a good position, lateral xray confirmed placement. A new anchor was placed and secured. The leads were then secured to the muscle with a strain relief using silk suture.   The lead was then tunneled from the lumbar incision to the flank. There, the leads were connected to the  battery. The battery was interrogated at this time and impedence found to be adequate. The battery was then placed into the flank incision and secured with silk suture. Hemostasis was achieved. Each incision was irrigated with saline and vancomycin powder placed.   A final fluoroscopic image was taken show good placement of leads. Then, each incision was closed with combination of 0, 2-0vicryls. The skin was closed with Dermabond in the lumbararea and Dermabond on flank. Sterile dressings were applied. The patient was returned to supine position and extubated. The patient was seen to be moving all extremities symmetrically and was taken to PACU for recovery. The family was updated and all questions answered.   ESTIMATED BLOOD LOSS:   10 cc   SPECIMENS None   IMPLANT Deretha Emory - [id number]  Inventory Item: Lauraine Rinne NEURO Serial no.:  Model/Cat no.: [id number]  Implant name: Deretha Emory - [id number] Laterality: N/A Area: Spine Thoracic  Manufacturer: Wilkie Aye Date of Manufacture:    Action: Implanted Number Used: 1   Device Identifier:  Device Identifier Type:         I performed the case in its entirety with assistance of PA, Ernestene Kiel, [place]

## 2021-06-06 NOTE — Anesthesia Procedure Notes (Signed)
Procedure Name: Intubation Date/Time: 06/06/2021 12:49 PM Performed by: Lia Foyer, CRNA Pre-anesthesia Checklist: Patient identified, Emergency Drugs available, Suction available and Patient being monitored Patient Re-evaluated:Patient Re-evaluated prior to induction Oxygen Delivery Method: Circle system utilized Preoxygenation: Pre-oxygenation with 100% oxygen Induction Type: IV induction Ventilation: Mask ventilation without difficulty Laryngoscope Size: McGraph and 4 Grade View: Grade I Tube type: Oral Tube size: 7.5 mm Number of attempts: 1 Airway Equipment and Method: Stylet and Video-laryngoscopy Placement Confirmation: ETT inserted through vocal cords under direct vision, positive ETCO2 and breath sounds checked- equal and bilateral Secured at: 22 cm Tube secured with: Tape Dental Injury: Teeth and Oropharynx as per pre-operative assessment

## 2021-06-06 NOTE — Discharge Instructions (Addendum)
NEUROSURGERY DISCHARGE INSTRUCTIONS  Admission diagnosis: chronic pain g89.4  Operative procedure: SCS revision  What to do after you leave the hospital:  Recommended diet: regular diet. Increase protein intake to promote wound healing.  Recommended activity: no lifting, driving, or strenuous exercise for 6 weeks . You should walk multiple times per day  Special Instructions  Hold home Aspirin for 7 days post-op  No straining, no heavy lifting > 10lbs x 4 weeks.  Keep incision area clean and dry. May shower in 2 days. No baths or pools for 6 weeks.  Please remove dressing tomorrow, no need to apply a bandage afterwards  You have no sutures to remove, the skin is closed with adhesive  Please take pain medications as directed. Take a stool softener if on pain medications   Please Report any of the following: Nausea or Vomiting, Temperature is greater than 101.64F (38.1C) degrees, Dizziness, Abdominal Pain, Difficulty Breathing or Shortness of Breath, Inability to Eat, drink Fluids, or Take medications, Bleeding, swelling, or drainage from surgical incision sites, New numbness or weakness, and Bowel or bladder dysfunction to the neurosurgeon on call at [phone]  Additional Follow up appointments Please follow up with Cooper Render PA-C in [place] clinic as scheduled in 2-3 weeks   Please see below for scheduled appointments:  Future Appointments  Date Time Provider [place]  07/04/2021  3:40 PM Jon Billings, NP [facility] [place]  07/06/2021  2:00 PM [facility] [place] [facility]  07/14/2021  1:45 PM [facility] None  07/14/2021  2:15 PM Verlon Au, NP [facility] None  07/19/2021  3:00 PM Gillis Santa, MD [facility] None  08/01/2021  3:20 PM Agbor-Etang, Aaron Edelman, MD [facility]     AMBULATORY SURGERY  DISCHARGE INSTRUCTIONS   The drugs that you were given will stay in your system until tomorrow so for the next 24 hours you should  not:  Drive an automobile Make any legal decisions Drink any alcoholic beverage   You may resume regular meals tomorrow.  Today it is better to start with liquids and gradually work up to solid foods.  You may eat anything you prefer, but it is better to start with liquids, then soup and crackers, and gradually work up to solid foods.   Please notify your doctor immediately if you have any unusual bleeding, trouble breathing, redness and pain at the surgery site, drainage, fever, or pain not relieved by medication.    Your post-operative visit with Dr.                                       is: Date:                        Time:    Please call to schedule your post-operative visit.  Additional Instructions:

## 2021-06-06 NOTE — Transfer of Care (Signed)
Immediate Anesthesia Transfer of Care Note  Patient: Kevin Alexander  Procedure(s) Performed: REVISION STIMULATOR ELECTRODE ([employer])  Patient Location: PACU  Anesthesia Type:General  Level of Consciousness: drowsy  Airway & Oxygen Therapy: Patient Spontanous Breathing and Patient connected to face mask oxygen  Post-op Assessment: Report given to RN and Post -op Vital signs reviewed and stable  Post vital signs: Reviewed and stable  Last Vitals:  Vitals Value Taken Time  BP 134/76 06/06/21 1420  Temp    Pulse 90 06/06/21 1425  Resp 17 06/06/21 1425  SpO2 100 % 06/06/21 1425  Vitals shown include unvalidated device data.  Last Pain:  Vitals:   06/06/21 1210  TempSrc: Oral  PainSc: 10-Worst pain ever         Complications: No notable events documented.

## 2021-06-06 NOTE — Interval H&P Note (Signed)
History and Physical Interval Note:  06/06/2021 11:51 AM  Kevin Alexander  has presented today for surgery, with the diagnosis of chronic pain g89.4.  The various methods of treatment have been discussed with the patient and family. After consideration of risks, benefits and other options for treatment, the patient has consented to  Procedure(s): REVISION STIMULATOR ELECTRODE ([place]) (N/A) as a surgical intervention.  The patient's history has been reviewed, patient examined, no change in status, stable for surgery.  I have reviewed the patient's chart and labs.  Questions were answered to the patient's satisfaction.     Deetta Perla

## 2021-06-07 ENCOUNTER — Encounter: Payer: Self-pay | Admitting: Neurosurgery

## 2021-06-07 NOTE — Anesthesia Postprocedure Evaluation (Signed)
Anesthesia Post Note  Patient: Kevin Alexander  Procedure(s) Performed: REVISION STIMULATOR ELECTRODE ([employer])  Patient location during evaluation: PACU Anesthesia Type: General Level of consciousness: awake and alert, awake and oriented Pain management: pain level controlled Vital Signs Assessment: post-procedure vital signs reviewed and stable Respiratory status: spontaneous breathing, nonlabored ventilation and respiratory function stable Cardiovascular status: blood pressure returned to baseline and stable Postop Assessment: no apparent nausea or vomiting Anesthetic complications: no   No notable events documented.   Last Vitals:  Vitals:   06/06/21 1445 06/06/21 1458  BP: 131/74 134/75  Pulse: 85 88  Resp: 16 18  Temp: 36.4 C 36.5 C  SpO2: 97% 100%    Last Pain:  Vitals:   06/06/21 1458  TempSrc: Temporal  PainSc: 0-No pain                 Phill Mutter

## 2021-06-27 ENCOUNTER — Encounter: Payer: Self-pay | Admitting: Oncology

## 2021-06-29 ENCOUNTER — Encounter: Payer: Self-pay | Admitting: Oncology

## 2021-07-04 ENCOUNTER — Other Ambulatory Visit: Payer: Self-pay

## 2021-07-04 ENCOUNTER — Encounter: Payer: Self-pay | Admitting: Nurse Practitioner

## 2021-07-04 ENCOUNTER — Ambulatory Visit (INDEPENDENT_AMBULATORY_CARE_PROVIDER_SITE_OTHER): Payer: Medicare Other | Admitting: Nurse Practitioner

## 2021-07-04 VITALS — BP 133/86 | HR 142 | Temp 98.2°F | Ht 65.9 in | Wt 141.8 lb

## 2021-07-04 DIAGNOSIS — G8929 Other chronic pain: Secondary | ICD-10-CM

## 2021-07-04 DIAGNOSIS — E559 Vitamin D deficiency, unspecified: Secondary | ICD-10-CM | POA: Diagnosis not present

## 2021-07-04 DIAGNOSIS — R61 Generalized hyperhidrosis: Secondary | ICD-10-CM

## 2021-07-04 DIAGNOSIS — I208 Other forms of angina pectoris: Secondary | ICD-10-CM | POA: Diagnosis not present

## 2021-07-04 DIAGNOSIS — I10 Essential (primary) hypertension: Secondary | ICD-10-CM | POA: Diagnosis not present

## 2021-07-04 DIAGNOSIS — Q231 Congenital insufficiency of aortic valve: Secondary | ICD-10-CM

## 2021-07-04 DIAGNOSIS — Z7689 Persons encountering health services in other specified circumstances: Secondary | ICD-10-CM

## 2021-07-04 DIAGNOSIS — M545 Low back pain, unspecified: Secondary | ICD-10-CM

## 2021-07-04 DIAGNOSIS — G2581 Restless legs syndrome: Secondary | ICD-10-CM

## 2021-07-04 DIAGNOSIS — E43 Unspecified severe protein-calorie malnutrition: Secondary | ICD-10-CM

## 2021-07-04 MED ORDER — PRAMIPEXOLE DIHYDROCHLORIDE 0.125 MG PO TABS: 0.1250 mg | ORAL_TABLET | Freq: Every evening | ORAL | 0 refills | Status: DC | PRN

## 2021-07-04 MED ORDER — NITROGLYCERIN 0.4 MG SL SUBL: 0.4000 mg | SUBLINGUAL_TABLET | SUBLINGUAL | 0 refills | Status: DC | PRN

## 2021-07-04 MED ORDER — VITAMIN B-1 100 MG PO TABS: 100.0000 mg | ORAL_TABLET | Freq: Every day | ORAL | 1 refills | Status: DC

## 2021-07-04 MED ORDER — VITAMIN D (ERGOCALCIFEROL) 1.25 MG (50000 UNIT) PO CAPS: 50000.0000 [IU] | ORAL_CAPSULE | ORAL | 3 refills | Status: AC

## 2021-07-04 MED ORDER — DILTIAZEM HCL ER COATED BEADS 120 MG PO CP24: 120.0000 mg | ORAL_CAPSULE | Freq: Every day | ORAL | 0 refills | Status: DC

## 2021-07-04 MED ORDER — DULOXETINE HCL 60 MG PO CPEP: 60.0000 mg | ORAL_CAPSULE | Freq: Every day | ORAL | 1 refills | Status: DC

## 2021-07-04 MED ORDER — DULOXETINE HCL 30 MG PO CPEP: 30.0000 mg | ORAL_CAPSULE | ORAL | 1 refills | Status: DC
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
## 2021-07-05 DIAGNOSIS — M549 Dorsalgia, unspecified: Secondary | ICD-10-CM | POA: Insufficient documentation

## 2021-07-05 DIAGNOSIS — G8929 Other chronic pain: Secondary | ICD-10-CM | POA: Insufficient documentation

## 2021-07-05 NOTE — Assessment & Plan Note (Signed)
Controlled. Refilled Nitro at visit today. Patient has not had to use any. Continue to follow up with Cardiology.

## 2021-07-05 NOTE — Assessment & Plan Note (Signed)
Patient states he has been on Dexamethasone for help to increase his appetite. Discussed with patient that he should follow up with GI for further treatment of nausea.

## 2021-07-05 NOTE — Assessment & Plan Note (Signed)
Labs ordered today.  Will make recommendations based on lab results. ?

## 2021-07-05 NOTE — Assessment & Plan Note (Signed)
Complains of ongoing restless leg syndrome. Not currently on treatment. Has taken Valium in the past. Discussed the risks of taking Benzodiazepine and Narcotic. Will prescribe Remeron to help with RLS. Side effects and benefits of medication discussed during visit. Follow up in 1 month for reevaluation.

## 2021-07-05 NOTE — Assessment & Plan Note (Signed)
Chronic.  Controlled.  Continue with current medication regimen.  Followed by Cardiology. Patient ran out of Cardizem.  Will send in short supply during visit today. Discussed with patient that he will need to get it from Cardiology in the future.  Labs ordered today.  Return to clinic in 1 months for reevaluation.  Call sooner if concerns arise.

## 2021-07-05 NOTE — Assessment & Plan Note (Signed)
Had spinal cord stimulator placed by Dr. Lacinda Axon on December 19. Continues to see Dr. Holley Raring for pain management. Continue to follow up with specialists for ongoing pain.

## 2021-07-06 ENCOUNTER — Other Ambulatory Visit: Payer: Medicare Other

## 2021-07-07 ENCOUNTER — Other Ambulatory Visit: Payer: Medicare Other

## 2021-07-14 ENCOUNTER — Inpatient Hospital Stay: Payer: Medicare Other | Admitting: Nurse Practitioner

## 2021-07-14 ENCOUNTER — Inpatient Hospital Stay: Payer: Medicare Other | Attending: Nurse Practitioner

## 2021-07-14 ENCOUNTER — Encounter: Payer: Self-pay | Admitting: Nurse Practitioner

## 2021-07-14 ENCOUNTER — Other Ambulatory Visit: Payer: Self-pay

## 2021-07-14 VITALS — BP 152/91 | HR 111 | Temp 96.9°F | Ht 65.9 in | Wt 147.5 lb

## 2021-07-14 DIAGNOSIS — Z79899 Other long term (current) drug therapy: Secondary | ICD-10-CM | POA: Diagnosis not present

## 2021-07-14 DIAGNOSIS — R197 Diarrhea, unspecified: Secondary | ICD-10-CM | POA: Diagnosis not present

## 2021-07-14 DIAGNOSIS — F17210 Nicotine dependence, cigarettes, uncomplicated: Secondary | ICD-10-CM | POA: Diagnosis not present

## 2021-07-14 DIAGNOSIS — I10 Essential (primary) hypertension: Secondary | ICD-10-CM | POA: Insufficient documentation

## 2021-07-14 DIAGNOSIS — R634 Abnormal weight loss: Secondary | ICD-10-CM | POA: Insufficient documentation

## 2021-07-14 DIAGNOSIS — E876 Hypokalemia: Secondary | ICD-10-CM | POA: Diagnosis not present

## 2021-07-14 DIAGNOSIS — R110 Nausea: Secondary | ICD-10-CM | POA: Diagnosis not present

## 2021-07-14 DIAGNOSIS — Z7952 Long term (current) use of systemic steroids: Secondary | ICD-10-CM | POA: Diagnosis not present

## 2021-07-14 DIAGNOSIS — R748 Abnormal levels of other serum enzymes: Secondary | ICD-10-CM | POA: Insufficient documentation

## 2021-07-14 DIAGNOSIS — Z7982 Long term (current) use of aspirin: Secondary | ICD-10-CM | POA: Diagnosis not present

## 2021-07-14 DIAGNOSIS — R779 Abnormality of plasma protein, unspecified: Secondary | ICD-10-CM | POA: Diagnosis present

## 2021-07-14 LAB — CBC WITH DIFFERENTIAL/PLATELET
Abs Immature Granulocytes: 0.19 10*3/uL — ABNORMAL HIGH (ref 0.00–0.07)
Basophils Absolute: 0 10*3/uL (ref 0.0–0.1)
Basophils Relative: 0 %
Eosinophils Absolute: 0 10*3/uL (ref 0.0–0.5)
Eosinophils Relative: 0 %
HCT: 43.7 % (ref 39.0–52.0)
Hemoglobin: 14.7 g/dL (ref 13.0–17.0)
Immature Granulocytes: 1 %
Lymphocytes Relative: 19 %
Lymphs Abs: 3.9 10*3/uL (ref 0.7–4.0)
MCH: 31.5 pg (ref 26.0–34.0)
MCHC: 33.6 g/dL (ref 30.0–36.0)
MCV: 93.8 fL (ref 80.0–100.0)
Monocytes Absolute: 1 10*3/uL (ref 0.1–1.0)
Monocytes Relative: 5 %
Neutro Abs: 15.2 10*3/uL — ABNORMAL HIGH (ref 1.7–7.7)
Neutrophils Relative %: 75 %
Platelets: 376 10*3/uL (ref 150–400)
RBC: 4.66 MIL/uL (ref 4.22–5.81)
RDW: 13.7 % (ref 11.5–15.5)
WBC: 20.3 10*3/uL — ABNORMAL HIGH (ref 4.0–10.5)
nRBC: 0 % (ref 0.0–0.2)

## 2021-07-14 LAB — COMPREHENSIVE METABOLIC PANEL
ALT: 11 U/L (ref 0–44)
AST: 18 U/L (ref 15–41)
Albumin: 3.3 g/dL — ABNORMAL LOW (ref 3.5–5.0)
Alkaline Phosphatase: 168 U/L — ABNORMAL HIGH (ref 38–126)
Anion gap: 13 (ref 5–15)
BUN: 13 mg/dL (ref 6–20)
CO2: 22 mmol/L (ref 22–32)
Calcium: 8.6 mg/dL — ABNORMAL LOW (ref 8.9–10.3)
Chloride: 100 mmol/L (ref 98–111)
Creatinine, Ser: 1.22 mg/dL (ref 0.61–1.24)
GFR, Estimated: >60 mL/min (ref >60)
Glucose, Bld: 151 mg/dL — ABNORMAL HIGH (ref 70–99)
Potassium: 3.3 mmol/L — ABNORMAL LOW (ref 3.5–5.1)
Sodium: 135 mmol/L (ref 135–145)
Total Bilirubin: 0.5 mg/dL (ref 0.3–1.2)
Total Protein: 6.7 g/dL (ref 6.5–8.1)

## 2021-07-14 NOTE — Progress Notes (Signed)
Pt would like to know if he can get a refill on the rx Dr. Grayland Ormond gave him to increase his appetite?
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
## 2021-07-15 ENCOUNTER — Encounter: Payer: Self-pay | Admitting: Oncology

## 2021-07-15 MED ORDER — DEXAMETHASONE 4 MG PO TABS: 2.0000 mg | ORAL_TABLET | Freq: Every day | ORAL | 0 refills | Status: DC | PRN

## 2021-07-19 ENCOUNTER — Ambulatory Visit (INDEPENDENT_AMBULATORY_CARE_PROVIDER_SITE_OTHER): Payer: Medicare Other | Admitting: Family Medicine

## 2021-07-19 ENCOUNTER — Telehealth: Payer: Self-pay | Admitting: *Deleted

## 2021-07-19 ENCOUNTER — Encounter: Payer: Self-pay | Admitting: Family Medicine

## 2021-07-19 ENCOUNTER — Ambulatory Visit (HOSPITAL_BASED_OUTPATIENT_CLINIC_OR_DEPARTMENT_OTHER): Payer: Medicare Other | Admitting: Student in an Organized Health Care Education/Training Program

## 2021-07-19 DIAGNOSIS — R509 Fever, unspecified: Secondary | ICD-10-CM

## 2021-07-19 DIAGNOSIS — T85122A Displacement of implanted electronic neurostimulator (electrode) of spinal cord, initial encounter: Secondary | ICD-10-CM

## 2021-07-19 LAB — PROTEIN ELECTRO, RANDOM URINE
Albumin ELP, Urine: 13.5 %
Alpha-1-Globulin, U: 24.8 %
Alpha-2-Globulin, U: 18.4 %
Beta Globulin, U: 31.6 %
Gamma Globulin, U: 11.7 %
Total Protein, Urine: 121.1 mg/dL

## 2021-07-19 MED ORDER — HYDROCOD POLI-CHLORPHE POLI ER 10-8 MG/5ML PO SUER: 5.0000 mL | Freq: Two times a day (BID) | ORAL | 0 refills | Status: DC | PRN

## 2021-07-19 MED ORDER — BENZONATATE 200 MG PO CAPS: 200.0000 mg | ORAL_CAPSULE | Freq: Two times a day (BID) | ORAL | 0 refills | Status: DC | PRN

## 2021-07-19 MED ORDER — PREDNISONE 50 MG PO TABS: 50.0000 mg | ORAL_TABLET | Freq: Every day | ORAL | 0 refills | Status: DC

## 2021-07-19 NOTE — Progress Notes (Signed)
Patient unable to come in F2F

## 2021-07-19 NOTE — Progress Notes (Addendum)
There were no vitals taken for this visit.   Subjective:    Patient ID: Kevin Alexander, male    DOB: Jul 27, 1965, 56 y.o.   MRN: [id number]  HPI: Kevin Alexander is a 56 y.o. male  Chief Complaint  Patient presents with   Cough    Patient states he started coughing about 3 days ago   Fever    Patient had a fever of 103 and has chills and sweats   Emesis    Patient has vomit and diarrhea. Patient tested negative for COVID 2 days ago    UPPER RESPIRATORY TRACT INFECTION Duration: 3 days Fever: yes Cough: yes Shortness of breath: no Wheezing: no Chest pain: no Chest tightness: no Chest congestion: no Nasal congestion: yes Runny nose: yes Post nasal drip: yes Sneezing: no Sore throat: no Swollen glands: no Sinus pressure: no Headache: yes Face pain: no Toothache: no Ear pain: no  Ear pressure: no  Eyes red/itching:no Eye drainage/crusting: no  Vomiting: no Rash: no Fatigue: yes Sick contacts: no Strep contacts: no  Context: worse Recurrent sinusitis: no Relief with OTC cold/cough medications: no  Treatments attempted: none   Relevant past medical, surgical, family and social history reviewed and updated as indicated. Interim medical history since our last visit reviewed. Allergies and medications reviewed and updated.  Review of Systems  Constitutional:  Positive for chills, diaphoresis, fatigue and fever. Negative for activity change, appetite change and unexpected weight change.  HENT:  Positive for congestion, postnasal drip, rhinorrhea, sneezing and sore throat. Negative for dental problem, drooling, ear discharge, ear pain, facial swelling, hearing loss, mouth sores, nosebleeds, sinus pressure, sinus pain, tinnitus, trouble swallowing and voice change.   Respiratory:  Positive for cough and chest tightness. Negative for apnea, choking, shortness of breath, wheezing and stridor.   Cardiovascular: Negative.   Gastrointestinal: Negative.    Musculoskeletal: Negative.   Psychiatric/Behavioral: Negative.     Per HPI unless specifically indicated above     Objective:    There were no vitals taken for this visit.  Wt Readings from Last 3 Encounters:  07/14/21 147 lb 8 oz (66.9 kg)  07/04/21 141 lb 12.8 oz (64.3 kg)  06/06/21 143 lb 4.8 oz (65 kg)    Physical Exam Vitals and nursing note reviewed.  Pulmonary:     Effort: Pulmonary effort is normal. No respiratory distress.     Comments: Speaking in full sentences Neurological:     Mental Status: He is alert.  Psychiatric:        Mood and Affect: Mood normal.        Behavior: Behavior normal.        Thought Content: Thought content normal.        Judgment: Judgment normal.    Results for orders placed or performed in visit on 07/14/21  Comprehensive metabolic panel  Result Value Ref Range   Sodium 135 135 - 145 mmol/L   Potassium 3.3 (L) 3.5 - 5.1 mmol/L   Chloride 100 98 - 111 mmol/L   CO2 22 22 - 32 mmol/L   Glucose, Bld 151 (H) 70 - 99 mg/dL   BUN 13 6 - 20 mg/dL   Creatinine, Ser 1.22 0.61 - 1.24 mg/dL   Calcium 8.6 (L) 8.9 - 10.3 mg/dL   Total Protein 6.7 6.5 - 8.1 g/dL   Albumin 3.3 (L) 3.5 - 5.0 g/dL   AST 18 15 - 41 U/L   ALT 11 0 - 44 U/L  Alkaline Phosphatase 168 (H) 38 - 126 U/L   Total Bilirubin 0.5 0.3 - 1.2 mg/dL   GFR, Estimated >60 >60 mL/min   Anion gap 13 5 - 15  CBC with Differential/Platelet  Result Value Ref Range   WBC 20.3 (H) 4.0 - 10.5 K/uL   RBC 4.66 4.22 - 5.81 MIL/uL   Hemoglobin 14.7 13.0 - 17.0 g/dL   HCT 43.7 39.0 - 52.0 %   MCV 93.8 80.0 - 100.0 fL   MCH 31.5 26.0 - 34.0 pg   MCHC 33.6 30.0 - 36.0 g/dL   RDW 13.7 11.5 - 15.5 %   Platelets 376 150 - 400 K/uL   nRBC 0.0 0.0 - 0.2 %   Neutrophils Relative % 75 %   Neutro Abs 15.2 (H) 1.7 - 7.7 K/uL   Lymphocytes Relative 19 %   Lymphs Abs 3.9 0.7 - 4.0 K/uL   Monocytes Relative 5 %   Monocytes Absolute 1.0 0.1 - 1.0 K/uL   Eosinophils Relative 0 %   Eosinophils  Absolute 0.0 0.0 - 0.5 K/uL   Basophils Relative 0 %   Basophils Absolute 0.0 0.0 - 0.1 K/uL   Immature Granulocytes 1 %   Abs Immature Granulocytes 0.19 (H) 0.00 - 0.07 K/uL      Assessment & Plan:   Problem List Items Addressed This Visit   None Visit Diagnoses     Fever, unspecified fever cause    -  Primary   Will treat cough symptomatically with prednisone burst and tessalon and tussionex until we get the results back. Come in for swabs ASAP.   Relevant Orders   Rapid Strep Screen ([facility] ONLY)   Veritor Flu A/B Waived   Novel Coronavirus, NAA ([facility])        Follow up plan: Return if symptoms worsen or fail to improve.   This visit was completed via telephone due to the restrictions of the COVID-19 pandemic. All issues as above were discussed and addressed but no physical exam was performed. If it was felt that the patient should be evaluated in the office, they were directed there. The patient verbally consented to this visit. Patient was unable to complete an audio/visual visit due to Lack of equipment. Due to the catastrophic nature of the COVID-19 pandemic, this visit was done through audio contact only. Location of the patient: home Location of the provider: work Those involved with this call:  Provider: Park Liter, DO CMA: Louanna Raw, CMA Front Desk/Registration: [employer]  Time spent on call:  21 minutes on the phone discussing health concerns. 30 minutes total spent in review of patient's record and preparation of their chart.

## 2021-07-19 NOTE — Telephone Encounter (Signed)
Pt called to request meds prescribed today be resent to [facility] in [place]. Obtained information from pt on all meds and explained NT could not call in Tussionex as is a narcotic but could assist in resending other meds tonight as requested. Pt said "Forget it if I have to do this tomorrow"  and hung up.

## 2021-07-20 LAB — NOVEL CORONAVIRUS, NAA: SARS-CoV-2, NAA: NOT DETECTED

## 2021-07-20 LAB — SARS-COV-2, NAA 2 DAY TAT

## 2021-07-20 MED ORDER — PREDNISONE 50 MG PO TABS: 50.0000 mg | ORAL_TABLET | Freq: Every day | ORAL | 0 refills | Status: DC

## 2021-07-20 MED ORDER — BENZONATATE 200 MG PO CAPS: 200.0000 mg | ORAL_CAPSULE | Freq: Two times a day (BID) | ORAL | 0 refills | Status: DC | PRN

## 2021-07-20 MED ORDER — HYDROCOD POLI-CHLORPHE POLI ER 10-8 MG/5ML PO SUER: 5.0000 mL | Freq: Two times a day (BID) | ORAL | 0 refills | Status: DC | PRN

## 2021-07-20 NOTE — Addendum Note (Signed)
Addended by: Valerie Roys on: 07/20/2021 05:21 PM   Modules accepted: Orders

## 2021-07-20 NOTE — Telephone Encounter (Signed)
Please resend medications to [facility] in [place]

## 2021-07-21 ENCOUNTER — Encounter: Payer: Self-pay | Admitting: Oncology

## 2021-07-22 LAB — VERITOR FLU A/B WAIVED
Influenza A: NEGATIVE
Influenza B: NEGATIVE

## 2021-07-22 LAB — RAPID STREP SCREEN (MED CTR MEBANE ONLY): Strep Gp A Ag, IA W/Reflex: NEGATIVE

## 2021-07-22 LAB — CULTURE, GROUP A STREP: Strep A Culture: NEGATIVE

## 2021-07-26 ENCOUNTER — Encounter: Payer: Medicare Other | Admitting: Student in an Organized Health Care Education/Training Program

## 2021-07-29 ENCOUNTER — Ambulatory Visit (INDEPENDENT_AMBULATORY_CARE_PROVIDER_SITE_OTHER): Payer: Medicare Other

## 2021-07-29 ENCOUNTER — Other Ambulatory Visit: Payer: Self-pay

## 2021-07-29 DIAGNOSIS — Q231 Congenital insufficiency of aortic valve: Secondary | ICD-10-CM | POA: Diagnosis not present

## 2021-07-29 LAB — ECHOCARDIOGRAM COMPLETE
AR max vel: 1.43 cm2
AV Area VTI: 1.55 cm2
AV Area mean vel: 1.58 cm2
AV Mean grad: 8 mmHg
AV Peak grad: 15.5 mmHg
Ao pk vel: 1.97 m/s
Area-P 1/2: 4.36 cm2
Calc EF: 65.5 %
S' Lateral: 3.1 cm
Single Plane A2C EF: 71.3 %
Single Plane A4C EF: 62.5 %

## 2021-08-01 ENCOUNTER — Encounter: Payer: Self-pay | Admitting: Cardiology

## 2021-08-01 ENCOUNTER — Other Ambulatory Visit: Payer: Self-pay

## 2021-08-01 ENCOUNTER — Ambulatory Visit: Payer: Medicare Other | Admitting: Cardiology

## 2021-08-01 ENCOUNTER — Encounter: Payer: Self-pay | Admitting: Oncology

## 2021-08-01 VITALS — BP 130/78 | HR 102 | Ht 66.0 in | Wt 150.0 lb

## 2021-08-01 DIAGNOSIS — E782 Mixed hyperlipidemia: Secondary | ICD-10-CM | POA: Diagnosis not present

## 2021-08-01 DIAGNOSIS — IMO0001 Reserved for inherently not codable concepts without codable children: Secondary | ICD-10-CM

## 2021-08-01 DIAGNOSIS — Q2381 Bicuspid aortic valve: Secondary | ICD-10-CM

## 2021-08-01 DIAGNOSIS — I10 Essential (primary) hypertension: Secondary | ICD-10-CM

## 2021-08-01 DIAGNOSIS — Q231 Congenital insufficiency of aortic valve: Secondary | ICD-10-CM

## 2021-08-01 DIAGNOSIS — F17200 Nicotine dependence, unspecified, uncomplicated: Secondary | ICD-10-CM | POA: Diagnosis not present

## 2021-08-01 MED ORDER — VERAPAMIL HCL ER 180 MG PO TBCR: 180.0000 mg | EXTENDED_RELEASE_TABLET | Freq: Every day | ORAL | 3 refills | Status: DC
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
## 2021-08-01 NOTE — Patient Instructions (Signed)
Medication Instructions:   Your physician has recommended you make the following change in your medication:    STOP taking Cardizem (Diltiazem).  2.    START taking Verapamil (Calan-SR) 180 MG once a day.  *If you need a refill on your cardiac medications before your next appointment, please call your pharmacy*   Lab Work: None ordered If you have labs (blood work) drawn today and your tests are completely normal, you will receive your results only by: [place] (if you have MyChart) OR A paper copy in the mail If you have any lab test that is abnormal or we need to change your treatment, we will call you to review the results.   Testing/Procedures: None ordered   Follow-Up: At [facility], you and your health needs are our priority.  As part of our continuing mission to provide you with exceptional heart care, we have created designated Provider Care Teams.  These Care Teams include your primary Cardiologist (physician) and Advanced Practice Providers (APPs -  Physician Assistants and Nurse Practitioners) who all work together to provide you with the care you need, when you need it.  We recommend signing up for the patient portal called "MyChart".  Sign up information is provided on this After Visit Summary.  MyChart is used to connect with patients for Virtual Visits (Telemedicine).  Patients are able to view lab/test results, encounter notes, upcoming appointments, etc.  Non-urgent messages can be sent to your provider as well.   To learn more about what you can do with MyChart, go to [email or web address].    Your next appointment:   3 month(s)  The format for your next appointment:   In Person  Provider:   You may see Kate Sable, MD or one of the following Advanced Practice Providers on your designated Care Team:   Murray Hodgkins, NP Christell Faith, PA-C Cadence Kathlen Mody, [place]    Other Instructions

## 2021-08-04 ENCOUNTER — Encounter: Payer: Self-pay | Admitting: Nurse Practitioner

## 2021-08-04 ENCOUNTER — Other Ambulatory Visit: Payer: Self-pay

## 2021-08-04 ENCOUNTER — Ambulatory Visit (INDEPENDENT_AMBULATORY_CARE_PROVIDER_SITE_OTHER): Payer: Medicare Other | Admitting: Nurse Practitioner

## 2021-08-04 VITALS — BP 125/87 | HR 118 | Temp 98.7°F | Ht 65.98 in | Wt 147.7 lb

## 2021-08-04 DIAGNOSIS — G2581 Restless legs syndrome: Secondary | ICD-10-CM | POA: Diagnosis not present

## 2021-08-04 DIAGNOSIS — R5383 Other fatigue: Secondary | ICD-10-CM

## 2021-08-04 MED ORDER — PRAMIPEXOLE DIHYDROCHLORIDE 0.125 MG PO TABS: 0.1250 mg | ORAL_TABLET | Freq: Every evening | ORAL | 1 refills | Status: AC | PRN

## 2021-08-04 MED ORDER — VITAMIN B-1 100 MG PO TABS: 100.0000 mg | ORAL_TABLET | Freq: Every day | ORAL | 1 refills | Status: DC

## 2021-08-04 MED ORDER — DULOXETINE HCL 60 MG PO CPEP: 60.0000 mg | ORAL_CAPSULE | Freq: Every day | ORAL | 1 refills | Status: AC

## 2021-08-04 MED ORDER — DULOXETINE HCL 30 MG PO CPEP: 30.0000 mg | ORAL_CAPSULE | ORAL | 1 refills | Status: AC

## 2021-08-04 NOTE — Progress Notes (Signed)
BP 125/87    Pulse (!) 118    Temp 98.7 F (37.1 C) (Oral)    Ht 5' 5.98" (1.676 m)    Wt 147 lb 11.2 oz (67 kg)    SpO2 98%    BMI 23.85 kg/m    Subjective:    Patient ID: Kevin Alexander, male    DOB: [birth year], 56 y.o.   MRN: [id number]  HPI: Kevin Alexander is a 56 y.o. male  Chief Complaint  Patient presents with   Restless leg   RESTLESS LEGS Duration: years Discomfort description:   jumping Pain: yes Location:  whole legs Bilateral: yes Symmetric:  left is worse Severity: 8/10 Onset:  sudden Frequency:  constant Symptoms only occur while legs at rest: yes Sudden unintentional leg jerking: yes Bed partner bothered by leg movements: no LE numbness: yes Decreased sensation: no Weakness: yes Insomnia: yes Daytime somnolence: no Fatigue: yes Alleviating factors:  Aggravating factors:  Status: better Treatments attempted:    Relevant past medical, surgical, family and social history reviewed and updated as indicated. Interim medical history since our last visit reviewed. Allergies and medications reviewed and updated.  Review of Systems  Musculoskeletal:        RLS   Per HPI unless specifically indicated above     Objective:    BP 125/87    Pulse (!) 118    Temp 98.7 F (37.1 C) (Oral)    Ht 5' 5.98" (1.676 m)    Wt 147 lb 11.2 oz (67 kg)    SpO2 98%    BMI 23.85 kg/m   Wt Readings from Last 3 Encounters:  08/04/21 147 lb 11.2 oz (67 kg)  08/01/21 150 lb (68 kg)  07/14/21 147 lb 8 oz (66.9 kg)    Physical Exam Vitals and nursing note reviewed.  Constitutional:      General: He is not in acute distress.    Appearance: Normal appearance. He is not ill-appearing, toxic-appearing or diaphoretic.  HENT:     Head: Normocephalic.     Right Ear: External ear normal.     Left Ear: External ear normal.     Nose: Nose normal. No congestion or rhinorrhea.     Mouth/Throat:     Mouth: Mucous membranes are moist.  Eyes:     General:        Right eye:  No discharge.        Left eye: No discharge.     Extraocular Movements: Extraocular movements intact.     Conjunctiva/sclera: Conjunctivae normal.     Pupils: Pupils are equal, round, and reactive to light.  Cardiovascular:     Rate and Rhythm: Normal rate and regular rhythm.     Heart sounds: No murmur heard. Pulmonary:     Effort: Pulmonary effort is normal. No respiratory distress.     Breath sounds: Normal breath sounds. No wheezing, rhonchi or rales.  Abdominal:     General: Abdomen is flat. Bowel sounds are normal.  Musculoskeletal:     Cervical back: Normal range of motion and neck supple.  Skin:    General: Skin is warm and dry.     Capillary Refill: Capillary refill takes less than 2 seconds.  Neurological:     General: No focal deficit present.     Mental Status: He is alert and oriented to person, place, and time.  Psychiatric:        Mood and Affect: Mood normal.  Behavior: Behavior normal.        Thought Content: Thought content normal.        Judgment: Judgment normal.    Results for orders placed or performed in visit on 07/29/21  ECHOCARDIOGRAM COMPLETE  Result Value Ref Range   AR max vel 1.43 cm2   AV Peak grad 15.5 mmHg   Ao pk vel 1.97 m/s   S' Lateral 3.10 cm   Area-P 1/2 4.36 cm2   AV Area VTI 1.55 cm2   AV Mean grad 8.0 mmHg   Single Plane A4C EF 62.5 %   Single Plane A2C EF 71.3 %   Calc EF 65.5 %   AV Area mean vel 1.58 cm2      Assessment & Plan:   Problem List Items Addressed This Visit       Other   Restless legs syndrome - Primary    Chronic. Improved from last visit.  States he sleeps well when he takes the Mirapex at bedtime.  He hasn't been taking it every night.  Encouraged to take it nightly.  Can help him sleep better and improve fatigue.  Refill sent today. Follow up in 3 months for reevaluation.      Other Visit Diagnoses     Other fatigue       Unable to get labs at last visit. Reordered labs at visit today to  evaluate fatigue. Will make recommendations based on lab results.    Relevant Orders   Vitamin D (25 hydroxy)   Thyroid Panel With TSH   Thyroid peroxidase antibody   Comp Met (CMET)   Anemia Profile B        Follow up plan: Return in about 3 months (around 11/01/2021) for HTN, HLD, DM2 FU.

## 2021-08-04 NOTE — Assessment & Plan Note (Signed)
Chronic. Improved from last visit.  States he sleeps well when he takes the Mirapex at bedtime.  He hasn't been taking it every night.  Encouraged to take it nightly.  Can help him sleep better and improve fatigue.  Refill sent today. Follow up in 3 months for reevaluation.

## 2021-08-05 LAB — ANEMIA PROFILE B
Basophils Absolute: 0 10*3/uL (ref 0.0–0.2)
Basos: 0 %
EOS (ABSOLUTE): 0.1 10*3/uL (ref 0.0–0.4)
Eos: 1 %
Ferritin: 776 ng/mL — ABNORMAL HIGH (ref 30–400)
Folate: 11 ng/mL (ref >3.0)
Hematocrit: 42.8 % (ref 37.5–51.0)
Hemoglobin: 14.2 g/dL (ref 13.0–17.7)
Immature Grans (Abs): 0.2 10*3/uL — ABNORMAL HIGH (ref 0.0–0.1)
Immature Granulocytes: 1 %
Iron Saturation: 14 % — ABNORMAL LOW (ref 15–55)
Iron: 28 ug/dL — ABNORMAL LOW (ref 38–169)
Lymphocytes Absolute: 2.5 10*3/uL (ref 0.7–3.1)
Lymphs: 14 %
MCH: 30.4 pg (ref 26.6–33.0)
MCHC: 33.2 g/dL (ref 31.5–35.7)
MCV: 92 fL (ref 79–97)
Monocytes Absolute: 1.1 10*3/uL — ABNORMAL HIGH (ref 0.1–0.9)
Monocytes: 6 %
Neutrophils Absolute: 14.4 10*3/uL — ABNORMAL HIGH (ref 1.4–7.0)
Neutrophils: 78 %
Platelets: 325 10*3/uL (ref 150–450)
RBC: 4.67 x10E6/uL (ref 4.14–5.80)
RDW: 13 % (ref 11.6–15.4)
Retic Ct Pct: 1.4 % (ref 0.6–2.6)
Total Iron Binding Capacity: 196 ug/dL — ABNORMAL LOW (ref 250–450)
UIBC: 168 ug/dL (ref 111–343)
Vitamin B-12: 531 pg/mL (ref 232–1245)
WBC: 18.3 10*3/uL — ABNORMAL HIGH (ref 3.4–10.8)

## 2021-08-05 LAB — COMPREHENSIVE METABOLIC PANEL
ALT: 14 IU/L (ref 0–44)
AST: 12 IU/L (ref 0–40)
Albumin/Globulin Ratio: 1.9 (ref 1.2–2.2)
Albumin: 3.7 g/dL — ABNORMAL LOW (ref 3.8–4.9)
Alkaline Phosphatase: 119 IU/L (ref 44–121)
BUN/Creatinine Ratio: 8 — ABNORMAL LOW (ref 9–20)
BUN: 10 mg/dL (ref 6–24)
Bilirubin Total: <0.2 mg/dL (ref 0.0–1.2)
CO2: 19 mmol/L — ABNORMAL LOW (ref 20–29)
Calcium: 8.9 mg/dL (ref 8.7–10.2)
Chloride: 99 mmol/L (ref 96–106)
Creatinine, Ser: 1.26 mg/dL (ref 0.76–1.27)
Globulin, Total: 1.9 g/dL (ref 1.5–4.5)
Glucose: 223 mg/dL — ABNORMAL HIGH (ref 70–99)
Potassium: 4.5 mmol/L (ref 3.5–5.2)
Sodium: 137 mmol/L (ref 134–144)
Total Protein: 5.6 g/dL — ABNORMAL LOW (ref 6.0–8.5)
eGFR: 67 mL/min/{1.73_m2} (ref >59)

## 2021-08-05 LAB — VITAMIN D 25 HYDROXY (VIT D DEFICIENCY, FRACTURES): Vit D, 25-Hydroxy: 37.6 ng/mL (ref 30.0–100.0)

## 2021-08-05 LAB — THYROID PANEL WITH TSH
Free Thyroxine Index: 2.1 (ref 1.2–4.9)
T3 Uptake Ratio: 25 % (ref 24–39)
T4, Total: 8.3 ug/dL (ref 4.5–12.0)
TSH: 2.47 u[IU]/mL (ref 0.450–4.500)

## 2021-08-05 LAB — THYROID PEROXIDASE ANTIBODY: Thyroperoxidase Ab SerPl-aCnc: <9 IU/mL (ref 0–34)

## 2021-08-05 NOTE — Progress Notes (Signed)
Please let patient know that his lab work shows that he does have some iron deficiency anemia.  This is likely contributing to his lack of energy.  I recommend he continue with the ferrous sulfate (iron) supplement that he was given previously.  Please let me know if he has any questions.

## 2021-08-08 ENCOUNTER — Telehealth: Payer: Self-pay | Admitting: Nurse Practitioner

## 2021-08-08 ENCOUNTER — Ambulatory Visit: Payer: Self-pay

## 2021-08-08 NOTE — Telephone Encounter (Signed)
Patient called and triaged in NT encounter today, appt scheduled.    Reason for CRM: Pt stated he needs all 3 prescriptions that were sent to his pharmacy to be sent again because he is experiencing the same symptoms all over again. Pt stated he was just seen last week and would like the prescriptions sent to [facility] in [place]

## 2021-08-08 NOTE — Telephone Encounter (Signed)
Chief Complaint: Cough, chills Symptoms: Cough, chills, no fever Frequency: Cough started yesterday along with chills Pertinent Negatives: Patient denies SOB Disposition: [] ED /[] Urgent Care (no appt availability in office) / [x] Appointment(In office/virtual)/ []  [place] [facility]/ [] Home Care/ [] Refused Recommended Disposition /[] [place] [facility]/ []  Follow-up with PCP Additional Notes: N/A   Reason for Disposition  [1] Continuous (nonstop) coughing interferes with work or school AND [2] no improvement using cough treatment per Care Advice  Answer Assessment - Initial Assessment Questions 1. ONSET: "When did the cough begin?"      Started on yesterday 2. SEVERITY: "How bad is the cough today?"      Worse today, wake up coughing 3. SPUTUM: "Describe the color of your sputum" (none, dry cough; clear, white, yellow, green)     Nothing coming up 4. HEMOPTYSIS: "Are you coughing up any blood?" If so ask: "How much?" (flecks, streaks, tablespoons, etc.)     N/A 5. DIFFICULTY BREATHING: "Are you having difficulty breathing?" If Yes, ask: "How bad is it?" (e.g., mild, moderate, severe)    - MILD: No SOB at rest, mild SOB with walking, speaks normally in sentences, can lie down, no retractions, pulse < 100.    - MODERATE: SOB at rest, SOB with minimal exertion and prefers to sit, cannot lie down flat, speaks in phrases, mild retractions, audible wheezing, pulse 100-120.    - SEVERE: Very SOB at rest, speaks in single words, struggling to breathe, sitting hunched forward, retractions, pulse > 120      No 6. FEVER: "Do you have a fever?" If Yes, ask: "What is your temperature, how was it measured, and when did it start?"     No fever, but chills 7. CARDIAC HISTORY: "Do you have any history of heart disease?" (e.g., heart attack, congestive heart failure)      N/A 8. OTHER SYMPTOMS: "Do you have any other symptoms?" (e.g., runny nose, wheezing, chest pain)       Runny nose a  little  Protocols used: Cough - Acute Non-Productive-A-AH

## 2021-08-09 ENCOUNTER — Telehealth: Payer: Self-pay | Admitting: Nurse Practitioner

## 2021-08-09 ENCOUNTER — Ambulatory Visit (INDEPENDENT_AMBULATORY_CARE_PROVIDER_SITE_OTHER): Payer: Medicare Other | Admitting: Nurse Practitioner

## 2021-08-09 ENCOUNTER — Encounter: Payer: Self-pay | Admitting: Nurse Practitioner

## 2021-08-09 ENCOUNTER — Encounter: Payer: Medicare Other | Admitting: Student in an Organized Health Care Education/Training Program

## 2021-08-09 DIAGNOSIS — J069 Acute upper respiratory infection, unspecified: Secondary | ICD-10-CM

## 2021-08-09 MED ORDER — BENZONATATE 200 MG PO CAPS: 200.0000 mg | ORAL_CAPSULE | Freq: Two times a day (BID) | ORAL | 0 refills | Status: DC | PRN

## 2021-08-09 MED ORDER — ATORVASTATIN CALCIUM 20 MG PO TABS: 20.0000 mg | ORAL_TABLET | ORAL | 1 refills | Status: AC

## 2021-08-09 MED ORDER — PREDNISONE 50 MG PO TABS: ORAL_TABLET | ORAL | 0 refills | Status: DC

## 2021-08-09 MED ORDER — HYDROCOD POLI-CHLORPHE POLI ER 10-8 MG/5ML PO SUER: 5.0000 mL | Freq: Every evening | ORAL | 0 refills | Status: DC | PRN

## 2021-08-09 MED ORDER — ONDANSETRON 4 MG PO TBDP: 4.0000 mg | ORAL_TABLET | Freq: Three times a day (TID) | ORAL | 0 refills | Status: DC | PRN

## 2021-08-09 MED ORDER — LEVOCETIRIZINE DIHYDROCHLORIDE 5 MG PO TABS: 5.0000 mg | ORAL_TABLET | Freq: Every day | ORAL | 1 refills | Status: DC

## 2021-08-09 MED ORDER — OMEPRAZOLE 40 MG PO CPDR: 40.0000 mg | DELAYED_RELEASE_CAPSULE | ORAL | 1 refills | Status: AC

## 2021-08-09 NOTE — Telephone Encounter (Signed)
Copied from [place] [id number]. Topic: General - Other >> Aug 08, 2021  5:06 PM Matilde Sprang, RN wrote: Reason for CRM: Pt stated he needs all 3 prescriptions that were sent to his pharmacy to be sent again because he is experiencing the same symptoms all over again. Pt stated he was just seen last week and would like [facility] [place]

## 2021-08-09 NOTE — Telephone Encounter (Signed)
Taken care of during virtual visit.

## 2021-08-09 NOTE — Progress Notes (Signed)
There were no vitals taken for this visit.   Subjective:    Patient ID: Kevin Alexander, male    DOB: 1966/01/07, 56 y.o.   MRN: [id number]  HPI: Kevin Alexander is a 56 y.o. male  Chief Complaint  Patient presents with   URI    Pt states he has had a scratchy throat, nauseated, cough, and chills that started Sunday.    UPPER RESPIRATORY TRACT INFECTION Worst symptom: symptoms started on Sunday Fever: no Cough: yes Shortness of breath: yes Wheezing: no Chest pain: no Chest tightness: no Chest congestion: yes Nasal congestion: yes Runny nose: yes Post nasal drip: yes Sneezing: yes Sore throat:  scratchy Swollen glands: no Sinus pressure: yes Headache: no Face pain: no Toothache: no Ear pain: no bilateral Ear pressure: no bilateral Eyes red/itching:no Eye drainage/crusting: no  Vomiting:  dry heaving  Rash: no Fatigue: yes Sick contacts: no Strep contacts: no  Context: stable Recurrent sinusitis: no Relief with OTC cold/cough medications: no  Treatments attempted:  tylenol    Relevant past medical, surgical, family and social history reviewed and updated as indicated. Interim medical history since our last visit reviewed. Allergies and medications reviewed and updated.  Review of Systems  Constitutional:  Negative for fatigue and fever.  HENT:  Positive for congestion, postnasal drip, rhinorrhea, sinus pressure, sinus pain, sneezing and sore throat. Negative for ear pain.   Respiratory:  Positive for cough and shortness of breath. Negative for chest tightness and wheezing.   Gastrointestinal:  Positive for vomiting.  Skin:  Negative for rash.  Neurological:  Positive for headaches.   Per HPI unless specifically indicated above     Objective:    There were no vitals taken for this visit.  Wt Readings from Last 3 Encounters:  08/04/21 147 lb 11.2 oz (67 kg)  08/01/21 150 lb (68 kg)  07/14/21 147 lb 8 oz (66.9 kg)    Physical Exam Vitals and  nursing note reviewed.  HENT:     Right Ear: Hearing normal.     Left Ear: Hearing normal.  Pulmonary:     Effort: Pulmonary effort is normal. No respiratory distress.  Neurological:     Mental Status: He is alert.  Psychiatric:        Mood and Affect: Mood normal.        Behavior: Behavior normal.        Thought Content: Thought content normal.        Judgment: Judgment normal.    Results for orders placed or performed in visit on 08/04/21  Vitamin D (25 hydroxy)  Result Value Ref Range   Vit D, 25-Hydroxy 37.6 30.0 - 100.0 ng/mL  Thyroid Panel With TSH  Result Value Ref Range   TSH 2.470 0.450 - 4.500 uIU/mL   T4, Total 8.3 4.5 - 12.0 ug/dL   T3 Uptake Ratio 25 24 - 39 %   Free Thyroxine Index 2.1 1.2 - 4.9  Thyroid peroxidase antibody  Result Value Ref Range   Thyroperoxidase Ab SerPl-aCnc <9 0 - 34 IU/mL  Comp Met (CMET)  Result Value Ref Range   Glucose 223 (H) 70 - 99 mg/dL   BUN 10 6 - 24 mg/dL   Creatinine, Ser 1.26 0.76 - 1.27 mg/dL   eGFR 67 >59 mL/min/1.73   BUN/Creatinine Ratio 8 (L) 9 - 20   Sodium 137 134 - 144 mmol/L   Potassium 4.5 3.5 - 5.2 mmol/L   Chloride 99 96 - 106  mmol/L   CO2 19 (L) 20 - 29 mmol/L   Calcium 8.9 8.7 - 10.2 mg/dL   Total Protein 5.6 (L) 6.0 - 8.5 g/dL   Albumin 3.7 (L) 3.8 - 4.9 g/dL   Globulin, Total 1.9 1.5 - 4.5 g/dL   Albumin/Globulin Ratio 1.9 1.2 - 2.2   Bilirubin Total <0.2 0.0 - 1.2 mg/dL   Alkaline Phosphatase 119 44 - 121 IU/L   AST 12 0 - 40 IU/L   ALT 14 0 - 44 IU/L  Anemia Profile B  Result Value Ref Range   Total Iron Binding Capacity 196 (L) 250 - 450 ug/dL   UIBC 168 111 - 343 ug/dL   Iron 28 (L) 38 - 169 ug/dL   Iron Saturation 14 (L) 15 - 55 %   Ferritin 776 (H) 30 - 400 ng/mL   Vitamin B-12 531 232 - 1,245 pg/mL   Folate 11.0 >3.0 ng/mL   WBC 18.3 (H) 3.4 - 10.8 x10E3/uL   RBC 4.67 4.14 - 5.80 x10E6/uL   Hemoglobin 14.2 13.0 - 17.7 g/dL   Hematocrit 42.8 37.5 - 51.0 %   MCV 92 79 - 97 fL   MCH 30.4  26.6 - 33.0 pg   MCHC 33.2 31.5 - 35.7 g/dL   RDW 13.0 11.6 - 15.4 %   Platelets 325 150 - 450 x10E3/uL   Neutrophils 78 Not Estab. %   Lymphs 14 Not Estab. %   Monocytes 6 Not Estab. %   Eos 1 Not Estab. %   Basos 0 Not Estab. %   Neutrophils Absolute 14.4 (H) 1.4 - 7.0 x10E3/uL   Lymphocytes Absolute 2.5 0.7 - 3.1 x10E3/uL   Monocytes Absolute 1.1 (H) 0.1 - 0.9 x10E3/uL   EOS (ABSOLUTE) 0.1 0.0 - 0.4 x10E3/uL   Basophils Absolute 0.0 0.0 - 0.2 x10E3/uL   Immature Granulocytes 1 Not Estab. %   Immature Grans (Abs) 0.2 (H) 0.0 - 0.1 x10E3/uL   Retic Ct Pct 1.4 0.6 - 2.6 %      Assessment & Plan:   Problem List Items Addressed This Visit   None Visit Diagnoses     Viral upper respiratory tract infection    -  Primary   Will treat with prednisone, tessalon and ocugh medicine with codeine. Complete course of prednisone. FU if symptoms worsen or fail to improve.        Follow up plan: Return if symptoms worsen or fail to improve.   This visit was completed via MyChart due to the restrictions of the COVID-19 pandemic. All issues as above were discussed and addressed. Physical exam was done as above through visual confirmation on MyChart. If it was felt that the patient should be evaluated in the office, they were directed there. The patient verbally consented to this visit. Location of the patient: Home Location of the provider: Office Those involved with this call:  Provider: Jon Billings, NP CMA: Yvonna Alanis, [place] Desk/Registration: Morey Hummingbird  This encounter was conducted via telephone.  I spent 21 dedicated to the care of this patient on the date of this encounter to include previsit review of symptoms and treatment, face to face time with the patient, and post visit ordering of testing.

## 2021-08-09 NOTE — Telephone Encounter (Signed)
Spoke with pt and the medications he started to list are not on his medication list. Told pt it would be best to discuss this with Santiago Glad at his appt today. Pt has a telephone visit today @ 11:20.

## 2021-08-09 NOTE — Telephone Encounter (Signed)
Pt called back to follow up and stated he is unaware that any new medication has been sent to the pharmacy for him, so he has not picked up anything.   Pt requesting a call back.

## 2021-08-09 NOTE — Telephone Encounter (Signed)
Called patient to gather more information and see if he had already picked up his prescriptions from his local pharmacy before forwarding message to provider.

## 2021-08-23 ENCOUNTER — Telehealth: Payer: Self-pay | Admitting: Nurse Practitioner

## 2021-08-23 NOTE — Telephone Encounter (Signed)
Copied from [place] [id number]. Topic: Medicare AWV ?>> Aug 23, 2021 11:58 AM Lavonia Drafts wrote: ?Reason for CRM:  ?Left message for patient to call back and schedule the Medicare Annual Wellness Visit (AWV) virtually or by telephone. ? ?Last AWV 04/19/20 ? ?Please schedule at anytime with [facility] Health Advisor. ? ?45 minute appointment ? ?Any questions, please call me at [phone] ?

## 2021-08-31 ENCOUNTER — Encounter: Payer: Self-pay | Admitting: Nurse Practitioner

## 2021-08-31 ENCOUNTER — Ambulatory Visit (INDEPENDENT_AMBULATORY_CARE_PROVIDER_SITE_OTHER): Payer: Medicare Other | Admitting: *Deleted

## 2021-08-31 DIAGNOSIS — Z0000 Encounter for general adult medical examination without abnormal findings: Secondary | ICD-10-CM

## 2021-08-31 NOTE — Patient Instructions (Addendum)
Kevin Alexander , ?Thank you for taking time to come for your Medicare Wellness Visit. I appreciate your ongoing commitment to your health goals. Please review the following plan we discussed and let me know if I can assist you in the future.  ? ?Screening recommendations/referrals: ?Colonoscopy: up to date ?Recommended yearly ophthalmology/optometry visit for glaucoma screening and checkup ?Recommended yearly dental visit for hygiene and checkup ? ?Vaccinations: ?Influenza vaccine: up to date ?Pneumococcal vaccine: Education provided ?Tdap vaccine: up to date ?Shingles vaccine: up to date   ? ?Advanced directives: Education provided ? ?Conditions/risks identified:  ? ?Next appointment:11-02-2021  @ 2:00 ? ?Preventive Care 40-64 Years, Male ?Preventive care refers to lifestyle choices and visits with your health care provider that can promote health and wellness. ?What does preventive care include? ?A yearly physical exam. This is also called an annual well check. ?Dental exams once or twice a year. ?Routine eye exams. Ask your health care provider how often you should have your eyes checked. ?Personal lifestyle choices, including: ?Daily care of your teeth and gums. ?Regular physical activity. ?Eating a healthy diet. ?Avoiding tobacco and drug use. ?Limiting alcohol use. ?Practicing safe sex. ?Taking low-dose aspirin every day starting at age 56. ?What happens during an annual well check? ?The services and screenings done by your health care provider during your annual well check will depend on your age, overall health, lifestyle risk factors, and family history of disease. ?Counseling  ?Your health care provider may ask you questions about your: ?Alcohol use. ?Tobacco use. ?Drug use. ?Emotional well-being. ?Home and relationship well-being. ?Sexual activity. ?Eating habits. ?Work and work Statistician. ?Screening  ?You may have the following tests or measurements: ?Height, weight, and BMI. ?Blood pressure. ?Lipid and  cholesterol levels. These may be checked every 5 years, or more frequently if you are over 11 years old. ?Skin check. ?Lung cancer screening. You may have this screening every year starting at age 56 if you have a 30-pack-year history of smoking and currently smoke or have quit within the past 15 years. ?Fecal occult blood test (FOBT) of the stool. You may have this test every year starting at age 56. ?Flexible sigmoidoscopy or colonoscopy. You may have a sigmoidoscopy every 5 years or a colonoscopy every 10 years starting at age 56. ?Prostate cancer screening. Recommendations will vary depending on your family history and other risks. ?Hepatitis C blood test. ?Hepatitis B blood test. ?Sexually transmitted disease (STD) testing. ?Diabetes screening. This is done by checking your blood sugar (glucose) after you have not eaten for a while (fasting). You may have this done every 1-3 years. ?Discuss your test results, treatment options, and if necessary, the need for more tests with your health care provider. ?Vaccines  ?Your health care provider may recommend certain vaccines, such as: ?Influenza vaccine. This is recommended every year. ?Tetanus, diphtheria, and acellular pertussis (Tdap, Td) vaccine. You may need a Td booster every 10 years. ?Zoster vaccine. You may need this after age 56. ?Pneumococcal 13-valent conjugate (PCV13) vaccine. You may need this if you have certain conditions and have not been vaccinated. ?Pneumococcal polysaccharide (PPSV23) vaccine. You may need one or two doses if you smoke cigarettes or if you have certain conditions. ?Talk to your health care provider about which screenings and vaccines you need and how often you need them. ?This information is not intended to replace advice given to you by your health care provider. Make sure you discuss any questions you have with your health care provider. ?Document  Released: 07/02/2015 Document Revised: 02/23/2016 Document Reviewed:  04/06/2015 ?Elsevier Interactive Patient Education ? 2017 [place]. ? ?Fall Prevention in the Home ?Falls can cause injuries. They can happen to people of all ages. There are many things you can do to make your home safe and to help prevent falls. ?What can I do on the outside of my home? ?Regularly fix the edges of walkways and driveways and fix any cracks. ?Remove anything that might make you trip as you walk through a door, such as a raised step or threshold. ?Trim any bushes or trees on the path to your home. ?Use bright outdoor lighting. ?Clear any walking paths of anything that might make someone trip, such as rocks or tools. ?Regularly check to see if handrails are loose or broken. Make sure that both sides of any steps have handrails. ?Any raised decks and porches should have guardrails on the edges. ?Have any leaves, snow, or ice cleared regularly. ?Use sand or salt on walking paths during winter. ?Clean up any spills in your garage right away. This includes oil or grease spills. ?What can I do in the bathroom? ?Use night lights. ?Install grab bars by the toilet and in the tub and shower. Do not use towel bars as grab bars. ?Use non-skid mats or decals in the tub or shower. ?If you need to sit down in the shower, use a plastic, non-slip stool. ?Keep the floor dry. Clean up any water that spills on the floor as soon as it happens. ?Remove soap buildup in the tub or shower regularly. ?Attach bath mats securely with double-sided non-slip rug tape. ?Do not have throw rugs and other things on the floor that can make you trip. ?What can I do in the bedroom? ?Use night lights. ?Make sure that you have a light by your bed that is easy to reach. ?Do not use any sheets or blankets that are too big for your bed. They should not hang down onto the floor. ?Have a firm chair that has side arms. You can use this for support while you get dressed. ?Do not have throw rugs and other things on the floor that can make you  trip. ?What can I do in the kitchen? ?Clean up any spills right away. ?Avoid walking on wet floors. ?Keep items that you use a lot in easy-to-reach places. ?If you need to reach something above you, use a strong step stool that has a grab bar. ?Keep electrical cords out of the way. ?Do not use floor polish or wax that makes floors slippery. If you must use wax, use non-skid floor wax. ?Do not have throw rugs and other things on the floor that can make you trip. ?What can I do with my stairs? ?Do not leave any items on the stairs. ?Make sure that there are handrails on both sides of the stairs and use them. Fix handrails that are broken or loose. Make sure that handrails are as long as the stairways. ?Check any carpeting to make sure that it is firmly attached to the stairs. Fix any carpet that is loose or worn. ?Avoid having throw rugs at the top or bottom of the stairs. If you do have throw rugs, attach them to the floor with carpet tape. ?Make sure that you have a light switch at the top of the stairs and the bottom of the stairs. If you do not have them, ask someone to add them for you. ?What else can I do to help  prevent falls? ?Wear shoes that: ?Do not have high heels. ?Have rubber bottoms. ?Are comfortable and fit you well. ?Are closed at the toe. Do not wear sandals. ?If you use a stepladder: ?Make sure that it is fully opened. Do not climb a closed stepladder. ?Make sure that both sides of the stepladder are locked into place. ?Ask someone to hold it for you, if possible. ?Clearly mark and make sure that you can see: ?Any grab bars or handrails. ?First and last steps. ?Where the edge of each step is. ?Use tools that help you move around (mobility aids) if they are needed. These include: ?Canes. ?Walkers. ?Scooters. ?Crutches. ?Turn on the lights when you go into a dark area. Replace any light bulbs as soon as they burn out. ?Set up your furniture so you have a clear path. Avoid moving your furniture  around. ?If any of your floors are uneven, fix them. ?If there are any pets around you, be aware of where they are. ?Review your medicines with your doctor. Some medicines can make you feel dizzy. This can increase your chan
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
## 2021-08-31 NOTE — Progress Notes (Signed)
Reviewed with Dr. Grayland Ormond regarding weaning of steroids. He recommends beginning to take 1/2 pill/2 mg. At next visit will consider releasing to PCP for monitoring/management.  ?

## 2021-09-14 ENCOUNTER — Ambulatory Visit: Payer: Self-pay | Admitting: *Deleted

## 2021-09-14 NOTE — Telephone Encounter (Addendum)
?  Chief Complaint: weak, fatigue ?Symptoms: sweats, chills ?Frequency: constant fatigue ?Pertinent Negatives: Patient denies fever, also negative for covid ?Disposition: '[]'$ ED /'[]'$ Urgent Care (no appt availability in office) / '[x]'$ Appointment(In office/virtual)/ '[]'$  [facility]/ '[]'$ Home Care/ '[]'$ Refused Recommended Disposition /'[]'$ [facility]/ '[]'$  Follow-up with PCP ?Additional Notes: Virtual appt made with Dr. Neomia Dear for tomorrow. Pt is asking for Tramadol to be refilled, do not see on current med list. ? ? ?Reason for Disposition ? [1] MODERATE weakness (i.e., interferes with work, school, normal activities) AND [2] persists > 3 days ? ?Answer Assessment - Initial Assessment Questions ?1. DESCRIPTION: "Describe how you are feeling." ?    Fatigued, sweating, chills, no fever, not covid ?2. SEVERITY: "How bad is it?"  "Can you stand and walk?" ?  - MILD - Feels weak or tired, but does not interfere with work, school or normal activities ?  - MODERATE - Able to stand and walk; weakness interferes with work, school, or normal activities ?  - SEVERE - Unable to stand or walk ?    "Severe" can walk to bathroom ?3. ONSET:  "When did the weakness begin?" ?    2 days ?4. CAUSE: "What do you think is causing the weakness?" ?    unknown ?5. MEDICINES: "Have you recently started a new medicine or had a change in the amount of a medicine?" ?   no ?6. OTHER SYMPTOMS: "Do you have any other symptoms?" (e.g., chest pain, fever, cough, SOB, vomiting, diarrhea, bleeding, other areas of pain) ?    Vomiting diarrhea ?7. PREGNANCY: "Is there any chance you are pregnant?" "When was your last menstrual period?" ?    na ? ?Protocols used: Weakness (Generalized) and Fatigue-A-AH ? ?

## 2021-09-15 ENCOUNTER — Encounter: Payer: Self-pay | Admitting: Internal Medicine

## 2021-09-15 ENCOUNTER — Telehealth (INDEPENDENT_AMBULATORY_CARE_PROVIDER_SITE_OTHER): Payer: Medicare Other | Admitting: Internal Medicine

## 2021-09-15 DIAGNOSIS — R509 Fever, unspecified: Secondary | ICD-10-CM | POA: Diagnosis not present

## 2021-09-15 MED ORDER — BENZONATATE 100 MG PO CAPS: 100.0000 mg | ORAL_CAPSULE | Freq: Two times a day (BID) | ORAL | 0 refills | Status: DC | PRN

## 2021-09-15 MED ORDER — LACTINEX PO CHEW: 1.0000 | CHEWABLE_TABLET | ORAL | 1 refills | Status: DC | PRN

## 2021-09-15 NOTE — Telephone Encounter (Signed)
Patient had video visit with Dr. Neomia Dear today.  ?

## 2021-09-15 NOTE — Progress Notes (Signed)
? ?There were no vitals taken for this visit.  ? ?Subjective:  ? ? Patient ID: Kevin Alexander, male    DOB: Jul 21, 1965, 56 y.o.   MRN: [id number] ? ?Chief Complaint  ?Patient presents with  ?? Fatigue  ?  Diarrhea, vomiting, chills, cough started about 2 days ago  ? ? ?HPI: ?Kevin Alexander is a 56 y.o. male ? ? ?This visit was completed via telephone due to the restrictions of the COVID-19 pandemic. All issues as above were discussed and addressed but no physical exam was performed. If it was felt that the patient should be evaluated in the office, they were directed there. The patient verbally consented to this visit. Patient was unable to complete an audio/visual visit due to Technical difficulties. Due to the catastrophic nature of the COVID-19 pandemic, this visit was done through audio contact only. ?Location of the patient: work ?Location of the provider: work ?Those involved with this call:  ?Provider: Charlynne Cousins, MD ?CMA: Frazier Butt, CMA ?Front Desk/Registration: [employer]  ?Time spent on call: 10 minutes on the phone discussing health concerns. 10 minutes total spent in review of patient's record and preparation of their chart. ? ? ? ?Fever  ?This is a new problem. The current episode started in the past 7 days. Associated symptoms include diarrhea and vomiting.  ?Diarrhea  ?This is a new (x 2 days of 10 - 12) problem. Associated symptoms include a fever and vomiting.  ?Emesis  ?This is a new (was phelgm only no food) problem. Associated symptoms include diarrhea and a fever.  ? ?Chief Complaint  ?Patient presents with  ?? Fatigue  ?  Diarrhea, vomiting, chills, cough started about 2 days ago  ? ? ?Relevant past medical, surgical, family and social history reviewed and updated as indicated. Interim medical history since our last visit reviewed. ?Allergies and medications reviewed and updated. ? ?Review of Systems  ?Constitutional:  Positive for fever.  ?Gastrointestinal:  Positive for diarrhea  and vomiting.  ? ?Per HPI unless specifically indicated above ? ?   ?Objective:  ?  ?There were no vitals taken for this visit.  ?Wt Readings from Last 3 Encounters:  ?08/04/21 147 lb 11.2 oz (67 kg)  ?08/01/21 150 lb (68 kg)  ?07/14/21 147 lb 8 oz (66.9 kg)  ?  ?Physical Exam ? ?Results for orders placed or performed in visit on 08/04/21  ?Vitamin D (25 hydroxy)  ?Result Value Ref Range  ? Vit D, 25-Hydroxy 37.6 30.0 - 100.0 ng/mL  ?Thyroid Panel With TSH  ?Result Value Ref Range  ? TSH 2.470 0.450 - 4.500 uIU/mL  ? T4, Total 8.3 4.5 - 12.0 ug/dL  ? T3 Uptake Ratio 25 24 - 39 %  ? Free Thyroxine Index 2.1 1.2 - 4.9  ?Thyroid peroxidase antibody  ?Result Value Ref Range  ? Thyroperoxidase Ab SerPl-aCnc <9 0 - 34 IU/mL  ?Comp Met (CMET)  ?Result Value Ref Range  ? Glucose 223 (H) 70 - 99 mg/dL  ? BUN 10 6 - 24 mg/dL  ? Creatinine, Ser 1.26 0.76 - 1.27 mg/dL  ? eGFR 67 >59 mL/min/1.73  ? BUN/Creatinine Ratio 8 (L) 9 - 20  ? Sodium 137 134 - 144 mmol/L  ? Potassium 4.5 3.5 - 5.2 mmol/L  ? Chloride 99 96 - 106 mmol/L  ? CO2 19 (L) 20 - 29 mmol/L  ? Calcium 8.9 8.7 - 10.2 mg/dL  ? Total Protein 5.6 (L) 6.0 - 8.5 g/dL  ?  Albumin 3.7 (L) 3.8 - 4.9 g/dL  ? Globulin, Total 1.9 1.5 - 4.5 g/dL  ? Albumin/Globulin Ratio 1.9 1.2 - 2.2  ? Bilirubin Total <0.2 0.0 - 1.2 mg/dL  ? Alkaline Phosphatase 119 44 - 121 IU/L  ? AST 12 0 - 40 IU/L  ? ALT 14 0 - 44 IU/L  ?Anemia Profile B  ?Result Value Ref Range  ? Total Iron Binding Capacity 196 (L) 250 - 450 ug/dL  ? UIBC 168 111 - 343 ug/dL  ? Iron 28 (L) 38 - 169 ug/dL  ? Iron Saturation 14 (L) 15 - 55 %  ? Ferritin 776 (H) 30 - 400 ng/mL  ? Vitamin B-12 531 232 - 1,245 pg/mL  ? Folate 11.0 >3.0 ng/mL  ? WBC 18.3 (H) 3.4 - 10.8 x10E3/uL  ? RBC 4.67 4.14 - 5.80 x10E6/uL  ? Hemoglobin 14.2 13.0 - 17.7 g/dL  ? Hematocrit 42.8 37.5 - 51.0 %  ? MCV 92 79 - 97 fL  ? MCH 30.4 26.6 - 33.0 pg  ? MCHC 33.2 31.5 - 35.7 g/dL  ? RDW 13.0 11.6 - 15.4 %  ? Platelets 325 150 - 450 x10E3/uL  ?  Neutrophils 78 Not Estab. %  ? Lymphs 14 Not Estab. %  ? Monocytes 6 Not Estab. %  ? Eos 1 Not Estab. %  ? Basos 0 Not Estab. %  ? Neutrophils Absolute 14.4 (H) 1.4 - 7.0 x10E3/uL  ? Lymphocytes Absolute 2.5 0.7 - 3.1 x10E3/uL  ? Monocytes Absolute 1.1 (H) 0.1 - 0.9 x10E3/uL  ? EOS (ABSOLUTE) 0.1 0.0 - 0.4 x10E3/uL  ? Basophils Absolute 0.0 0.0 - 0.2 x10E3/uL  ? Immature Granulocytes 1 Not Estab. %  ? Immature Grans (Abs) 0.2 (H) 0.0 - 0.1 x10E3/uL  ? Retic Ct Pct 1.4 0.6 - 2.6 %  ? ?   ? ? ?Current Outpatient Medications:  ??  albuterol (2.5 MG/3ML) 0.083% NEBU 3 mL, albuterol (5 MG/ML) 0.5% NEBU 0.5 mL, Inhale 1 mg into the lungs every 6 (six) hours., Disp: , Rfl:  ??  aspirin EC 81 MG tablet, Take 81 mg by mouth daily. Swallow whole., Disp: , Rfl:  ??  atorvastatin (LIPITOR) 20 MG tablet, Take 1 tablet (20 mg total) by mouth every morning., Disp: 90 tablet, Rfl: 1 ??  benzonatate (TESSALON) 100 MG capsule, Take 1 capsule (100 mg total) by mouth 2 (two) times daily as needed for cough., Disp: 20 capsule, Rfl: 0 ??  benzonatate (TESSALON) 200 MG capsule, Take 1 capsule (200 mg total) by mouth 2 (two) times daily as needed for cough., Disp: 20 capsule, Rfl: 0 ??  dexamethasone (DECADRON) 4 MG tablet, Take 0.5-1 tablets (2-4 mg total) by mouth daily as needed (low appetite)., Disp: 30 tablet, Rfl: 0 ??  diltiazem (CARDIZEM CD) 120 MG 24 hr capsule, Take 120 mg by mouth daily., Disp: , Rfl:  ??  DULoxetine (CYMBALTA) 30 MG capsule, Take 1 capsule (30 mg total) by mouth every morning. Take with 60 mg to equal 90 mg daily, Disp: 90 capsule, Rfl: 1 ??  DULoxetine (CYMBALTA) 60 MG capsule, Take 1 capsule (60 mg total) by mouth daily., Disp: 90 capsule, Rfl: 1 ??  Ferrous Sulfate (SLOW RELEASE IRON PO), Take 1 tablet by mouth daily., Disp: , Rfl:  ??  lactobacillus acidophilus & bulgar (LACTINEX) chewable tablet, Chew 1 tablet by mouth as needed. Take after every loose stool, Disp: 30 tablet, Rfl: 1 ??  levocetirizine  (XYZAL) 5  MG tablet, Take 1 tablet (5 mg total) by mouth at bedtime., Disp: 90 tablet, Rfl: 1 ??  montelukast (SINGULAIR) 10 MG tablet, Take 10 mg by mouth at bedtime. , Disp: , Rfl:  ??  Multiple Vitamin (MULTIVITAMIN) tablet, Take 1 tablet by mouth daily., Disp: , Rfl:  ??  nitroGLYCERIN (NITROSTAT) 0.4 MG SL tablet, Place 1 tablet (0.4 mg total) under the tongue every 5 (five) minutes as needed for chest pain., Disp: 30 tablet, Rfl: 0 ??  omeprazole (PRILOSEC) 40 MG capsule, Take 1 capsule (40 mg total) by mouth every morning., Disp: 90 capsule, Rfl: 1 ??  ondansetron (ZOFRAN ODT) 4 MG disintegrating tablet, Take 1 tablet (4 mg total) by mouth every 8 (eight) hours as needed for nausea or vomiting., Disp: 20 tablet, Rfl: 0 ??  potassium chloride (KLOR-CON) 10 MEQ tablet, Take 10 mEq by mouth daily as needed (low energy/weaknesss)., Disp: , Rfl:  ??  pramipexole (MIRAPEX) 0.125 MG tablet, Take 1 tablet (0.125 mg total) by mouth at bedtime as needed., Disp: 90 tablet, Rfl: 1 ??  thiamine (VITAMIN B-1) 100 MG tablet, Take 1 tablet (100 mg total) by mouth daily., Disp: 90 tablet, Rfl: 1 ??  verapamil (CALAN-SR) 180 MG CR tablet, Take 1 tablet (180 mg total) by mouth at bedtime., Disp: 30 tablet, Rfl: 3 ??  Vitamin D, Ergocalciferol, (DRISDOL) 1.25 MG (50000 UNIT) CAPS capsule, Take 1 capsule (50,000 Units total) by mouth every Thursday., Disp: 12 capsule, Rfl: 3  ? ? ?Assessment & Plan:  ?Acute Diarrhea ? Sec to viral Gastroenteritis will need to check for COVID and FLU . Pt advised curbside tests will need to come to the parking lot and swabbed for the above.  ?Will need to use probiotics after every loose stool ?Consider imodium , use tylenol prn for fever  ?Pt advised a bland BRAT diet today.  ?Advised to call the office or go to the ER if she develops further abdominal pain crampign, any new onset of bleeding / black stools or  fresh red blood from any orifice,.  Pt verbalized understanding of such.  ? ?Problem  List Items Addressed This Visit   ? ?  ? Other  ? Fever - Primary  ?  ? ?No orders of the defined types were placed in this encounter. ?  ? ?Meds ordered this encounter  ?Medications  ?? benzonatate (TESSALON) 100 M

## 2021-09-16 ENCOUNTER — Encounter: Payer: Self-pay | Admitting: Internal Medicine

## 2021-09-29 ENCOUNTER — Other Ambulatory Visit: Payer: Self-pay | Admitting: *Deleted

## 2021-09-29 MED ORDER — VERAPAMIL HCL ER 180 MG PO TBCR: 180.0000 mg | EXTENDED_RELEASE_TABLET | Freq: Every day | ORAL | 0 refills | Status: DC

## 2021-10-05 ENCOUNTER — Ambulatory Visit: Payer: Medicare Other
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
## 2021-10-11 ENCOUNTER — Inpatient Hospital Stay: Payer: Medicare Other | Admitting: Oncology

## 2021-10-11 ENCOUNTER — Inpatient Hospital Stay: Payer: Medicare Other

## 2021-10-11 DIAGNOSIS — R634 Abnormal weight loss: Secondary | ICD-10-CM
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
## 2021-10-18 ENCOUNTER — Ambulatory Visit: Admission: RE | Admit: 2021-10-18 | Payer: Medicare Other | Source: Ambulatory Visit

## 2021-10-19 ENCOUNTER — Other Ambulatory Visit: Payer: Self-pay

## 2021-10-19 ENCOUNTER — Telehealth: Payer: Self-pay | Admitting: Oncology

## 2021-10-19 ENCOUNTER — Telehealth: Payer: Self-pay

## 2021-10-19 DIAGNOSIS — D509 Iron deficiency anemia, unspecified: Secondary | ICD-10-CM

## 2021-10-19 NOTE — Telephone Encounter (Signed)
Patient has an appointment scheduled for 10/20/21 @ 11. Patient is down for labs and CT scan results. Patient no showed to CT scan. Just FYI. Not sure if you still want him to come and have labs and still see you.  ?

## 2021-10-19 NOTE — Telephone Encounter (Signed)
Patient's relative Marcie Bal) called in stating that pt cannot get his CT scan completed at this time. She reports that he is weak, vomiting and "not up to travel*.  ? ?Patient has a f/u appointment scheduled tomorrow 5/4. Routing to clinical team to advise.  ? ? ?

## 2021-10-20 ENCOUNTER — Inpatient Hospital Stay: Payer: Medicare Other | Admitting: Oncology

## 2021-10-20 ENCOUNTER — Inpatient Hospital Stay: Payer: Medicare Other

## 2021-10-20 DIAGNOSIS — R634 Abnormal weight loss: Secondary | ICD-10-CM

## 2021-10-31 ENCOUNTER — Ambulatory Visit: Payer: Medicare Other | Admitting: Cardiology

## 2021-11-01 ENCOUNTER — Ambulatory Visit: Payer: Self-pay | Admitting: *Deleted

## 2021-11-01 NOTE — Telephone Encounter (Signed)
Summary: nausea and vomiting  ? Patient would like to speak with a member of staff about their nausea and vomiting  ? ?The patient has experienced discomfort for roughly a week    ? ?The patient shares that they are vomiting frequently  ? ?The patient was unable to remain on the phone with agent at the time of call  ? ?Please contact further   ?  ? ? ? ?Chief Complaint: nausea and vomiting  ?Symptoms: nausea/ vomiting 8-10 times a day , unable to eat or drink  ?Frequency: x 1 week ?Pertinent Negatives: Patient denies na ?Disposition: '[]'$ ED /'[]'$ Urgent Care (no appt availability in office) / '[]'$ Appointment(In office/virtual)/ '[]'$  [place] [facility]/ '[]'$ Home Care/ '[]'$ Refused Recommended Disposition /'[]'$ [place] [facility]/ '[x]'$  Follow-up with PCP ?Additional Notes:  ?Called patient and attempted to triage patient. Reports not tolerating fluids or food. Coughing while on phone and reported he had to go because he was getting sick. Unable to complete call and give recommended advise to go to ED. Please advise .  ?  ? ? ? ? ?Reason for Disposition ? [1] SEVERE vomiting (e.g., 6 or more times/day) AND [2] present > 8 hours (Exception: patient sounds well, is drinking liquids, does not sound dehydrated, and vomiting has lasted less than 24 hours) ? ?Answer Assessment - Initial Assessment Questions ?1. VOMITING SEVERITY: "How many times have you vomited in the past 24 hours?"  ?   - MILD:  1 - 2 times/day ?   - MODERATE: 3 - 5 times/day, decreased oral intake without significant weight loss or symptoms of dehydration ?   - SEVERE: 6 or more times/day, vomits everything or nearly everything, with significant weight loss, symptoms of dehydration  ?    8 - 10 times a day  ?2. ONSET: "When did the vomiting begin?"  ?    1 week  ?3. FLUIDS: "What fluids or food have you vomited up today?" "Have you been able to keep any fluids down?" ?    Not able to hold food or fluids down ?4. ABDOMINAL PAIN: "Are your having any abdominal  pain?" If yes : "How bad is it and what does it feel like?" (e.g., crampy, dull, intermittent, constant)  ?    na ?5. DIARRHEA: "Is there any diarrhea?" If Yes, ask: "How many times today?"  ?    na ?6. CONTACTS: "Is there anyone else in the family with the same symptoms?"  ?    na ?7. CAUSE: "What do you think is causing your vomiting?" ?    na ?8. HYDRATION STATUS: "Any signs of dehydration?" (e.g., dry mouth [not only dry lips], too weak to stand) "When did you last urinate?" ?    na ?9. OTHER SYMPTOMS: "Do you have any other symptoms?" (e.g., fever, headache, vertigo, vomiting blood or coffee grounds, recent head injury) ?    Nausea and vomiting  ?10. PREGNANCY: "Is there any chance you are pregnant?" "When was your last menstrual period?" ?      na ? ?Protocols used: Vomiting-A-AH ? ?

## 2021-11-01 NOTE — Telephone Encounter (Signed)
Pt called back in, advised him of advise from previous NT and he refuses to go to ED and states unless your dying useless going to there. Scheduled pt appt for tomorrow at 1400 with Dr. Neomia Dear.  ?

## 2021-11-01 NOTE — Telephone Encounter (Signed)
Attempted to call patient back for more information, patient NA. LVMTRC. Will try again. ? ?OK for PEC/Nurse Triage to get information. ?

## 2021-11-02 ENCOUNTER — Ambulatory Visit (INDEPENDENT_AMBULATORY_CARE_PROVIDER_SITE_OTHER): Payer: Medicare Other | Admitting: Internal Medicine

## 2021-11-02 ENCOUNTER — Ambulatory Visit: Payer: Medicare Other | Admitting: Nurse Practitioner

## 2021-11-02 ENCOUNTER — Emergency Department
Admission: EM | Admit: 2021-11-02 | Discharge: 2021-11-02 | Disposition: A | Discharge status: Home / Self Care | Payer: Medicare Other | Attending: Emergency Medicine | Admitting: Emergency Medicine

## 2021-11-02 ENCOUNTER — Emergency Department: Payer: Medicare Other

## 2021-11-02 ENCOUNTER — Encounter: Payer: Self-pay | Admitting: Internal Medicine

## 2021-11-02 ENCOUNTER — Other Ambulatory Visit: Payer: Self-pay

## 2021-11-02 ENCOUNTER — Telehealth: Payer: Self-pay

## 2021-11-02 VITALS — Ht 65.98 in | Wt 133.0 lb

## 2021-11-02 DIAGNOSIS — R1110 Vomiting, unspecified: Secondary | ICD-10-CM | POA: Diagnosis present

## 2021-11-02 DIAGNOSIS — R112 Nausea with vomiting, unspecified: Secondary | ICD-10-CM | POA: Diagnosis not present

## 2021-11-02 DIAGNOSIS — J069 Acute upper respiratory infection, unspecified: Secondary | ICD-10-CM | POA: Diagnosis not present

## 2021-11-02 DIAGNOSIS — E860 Dehydration: Secondary | ICD-10-CM | POA: Insufficient documentation

## 2021-11-02 DIAGNOSIS — J449 Chronic obstructive pulmonary disease, unspecified: Secondary | ICD-10-CM | POA: Insufficient documentation

## 2021-11-02 DIAGNOSIS — D72829 Elevated white blood cell count, unspecified: Secondary | ICD-10-CM | POA: Insufficient documentation

## 2021-11-02 DIAGNOSIS — I10 Essential (primary) hypertension: Secondary | ICD-10-CM | POA: Insufficient documentation

## 2021-11-02 DIAGNOSIS — F17200 Nicotine dependence, unspecified, uncomplicated: Secondary | ICD-10-CM | POA: Diagnosis not present

## 2021-11-02 DIAGNOSIS — R197 Diarrhea, unspecified: Secondary | ICD-10-CM | POA: Diagnosis not present

## 2021-11-02 DIAGNOSIS — E876 Hypokalemia: Secondary | ICD-10-CM | POA: Insufficient documentation

## 2021-11-02 LAB — CBC
HCT: 42.3 % (ref 39.0–52.0)
Hemoglobin: 14.3 g/dL (ref 13.0–17.0)
MCH: 30.3 pg (ref 26.0–34.0)
MCHC: 33.8 g/dL (ref 30.0–36.0)
MCV: 89.6 fL (ref 80.0–100.0)
Platelets: 558 10*3/uL — ABNORMAL HIGH (ref 150–400)
RBC: 4.72 MIL/uL (ref 4.22–5.81)
RDW: 13.4 % (ref 11.5–15.5)
WBC: 22 10*3/uL — ABNORMAL HIGH (ref 4.0–10.5)
nRBC: 0 % (ref 0.0–0.2)

## 2021-11-02 LAB — COMPREHENSIVE METABOLIC PANEL
ALT: 12 U/L (ref 0–44)
AST: 21 U/L (ref 15–41)
Albumin: 2.6 g/dL — ABNORMAL LOW (ref 3.5–5.0)
Alkaline Phosphatase: 168 U/L — ABNORMAL HIGH (ref 38–126)
Anion gap: 11 (ref 5–15)
BUN: 7 mg/dL (ref 6–20)
CO2: 22 mmol/L (ref 22–32)
Calcium: 7.9 mg/dL — ABNORMAL LOW (ref 8.9–10.3)
Chloride: 99 mmol/L (ref 98–111)
Creatinine, Ser: 1.35 mg/dL — ABNORMAL HIGH (ref 0.61–1.24)
GFR, Estimated: >60 mL/min (ref >60)
Glucose, Bld: 173 mg/dL — ABNORMAL HIGH (ref 70–99)
Potassium: 2.5 mmol/L — CL (ref 3.5–5.1)
Sodium: 132 mmol/L — ABNORMAL LOW (ref 135–145)
Total Bilirubin: 0.2 mg/dL — ABNORMAL LOW (ref 0.3–1.2)
Total Protein: 5.8 g/dL — ABNORMAL LOW (ref 6.5–8.1)

## 2021-11-02 LAB — VERITOR FLU A/B WAIVED
Influenza A: NEGATIVE
Influenza B: NEGATIVE

## 2021-11-02 LAB — SAMPLE TO BLOOD BANK

## 2021-11-02 LAB — LIPASE, BLOOD: Lipase: 33 U/L (ref 11–51)

## 2021-11-02 MED ORDER — IOHEXOL 300 MG/ML  SOLN
100.0000 mL | Freq: Once | INTRAMUSCULAR | Status: AC | PRN
  Administered 2021-11-02: 100 mL via INTRAVENOUS

## 2021-11-02 MED ORDER — ONDANSETRON HCL 4 MG/2ML IJ SOLN
4.0000 mg | Freq: Once | INTRAMUSCULAR | Status: AC
Start: 2021-11-02 — End: 2021-11-02
  Administered 2021-11-02: 4 mg via INTRAVENOUS
  Filled 2021-11-02: qty 2

## 2021-11-02 MED ORDER — ONDANSETRON 4 MG PO TBDP: 4.0000 mg | ORAL_TABLET | Freq: Three times a day (TID) | ORAL | 0 refills | Status: DC | PRN

## 2021-11-02 MED ORDER — POTASSIUM CHLORIDE CRYS ER 20 MEQ PO TBCR
40.0000 meq | EXTENDED_RELEASE_TABLET | Freq: Once | ORAL | Status: AC
  Administered 2021-11-02: 40 meq via ORAL
  Filled 2021-11-02: qty 2

## 2021-11-02 MED ORDER — POTASSIUM CHLORIDE 10 MEQ/100ML IV SOLN
10.0000 meq | INTRAVENOUS | Status: AC
  Administered 2021-11-02 (×3): 10 meq via INTRAVENOUS
  Filled 2021-11-02 (×3): qty 100

## 2021-11-02 MED ORDER — LACTATED RINGERS IV BOLUS
1000.0000 mL | Freq: Once | INTRAVENOUS | Status: AC
  Administered 2021-11-02: 1000 mL via INTRAVENOUS

## 2021-11-02 MED ORDER — MAGNESIUM OXIDE -MG SUPPLEMENT 400 (240 MG) MG PO TABS
800.0000 mg | ORAL_TABLET | Freq: Once | ORAL | Status: AC
  Administered 2021-11-02: 800 mg via ORAL
  Filled 2021-11-02: qty 2

## 2021-11-02 NOTE — ED Provider Notes (Signed)
? ?[facility] ?Provider Note ? ? ? Event Date/Time  ? First MD Initiated Contact with Patient 11/02/21 1540   ?  (approximate) ? ? ?History  ? ?Emesis ? ? ?HPI ? ?Kevin Alexander is a 56 y.o. male who presents to the ED for evaluation of Emesis ?  ?I reviewed PCP clinic visit from earlier this afternoon where he was evaluated for subacute vomiting, fatigue and diarrhea.  Apparently ill-appearing, resting to the office restroom to vomit and was urged to come to the ED for evaluation. ?Otherwise, he has a history of COPD and continued smoking, HTN, HLD and bicuspid aortic valve.  Chronic peripheral neuropathy with a lumbar spinal stimulator ? ?Patient presents to the ED for evaluation of nausea, vomiting and diarrhea for the past 2 weeks.  Reports he cannot keep anything down but denies any abdominal pain, fever, back pain, dysuria.  Reports feeling nauseous now. ?Reports his breathing feels like is at baseline without increased cough, chest pain or shortness of breath. ? ?Physical Exam  ? ?Triage Vital Signs: ?ED Triage Vitals  ?Enc Vitals Group  ?   BP 11/02/21 1510 (!) 131/94  ?   Pulse Rate 11/02/21 1510 (!) 115  ?   Resp 11/02/21 1510 16  ?   Temp 11/02/21 1510 98.3 ?F (36.8 ?C)  ?   Temp Source 11/02/21 1510 Oral  ?   SpO2 11/02/21 1509 98 %  ?   Weight 11/02/21 1510 132 lb 15 oz (60.3 kg)  ?   Height 11/02/21 1510 '5\' 5"'$  (1.651 m)  ?   Head Circumference --   ?   Peak Flow --   ?   Pain Score --   ?   Pain Loc --   ?   Pain Edu? --   ?   Excl. in [place]? --   ? ? ?Most recent vital signs: ?Vitals:  ? 11/02/21 1606 11/02/21 1730  ?BP: 104/67 (!) 141/92  ?Pulse: (!) 103 (!) 109  ?Resp: 13 18  ?Temp:    ?SpO2: 99% 98%  ? ? ?General: Awake, no distress.  Pleasant and conversational. ?CV:  Good peripheral perfusion.  Tachycardic and regular ?Resp:  Normal effort.  No wheezing appreciated, clear ?Abd:  No distention.  Soft and benign throughout ?MSK:  No deformity noted.  ?Neuro:  No focal deficits  appreciated. ?Other:   ? ? ?ED Results / Procedures / Treatments  ? ?Labs ?(all labs ordered are listed, but only abnormal results are displayed) ?Labs Reviewed  ?COMPREHENSIVE METABOLIC PANEL - Abnormal; Notable for the following components:  ?    Result Value  ? Sodium 132 (*)   ? Potassium 2.5 (*)   ? Glucose, Bld 173 (*)   ? Creatinine, Ser 1.35 (*)   ? Calcium 7.9 (*)   ? Total Protein 5.8 (*)   ? Albumin 2.6 (*)   ? Alkaline Phosphatase 168 (*)   ? Total Bilirubin 0.2 (*)   ? All other components within normal limits  ?CBC - Abnormal; Notable for the following components:  ? WBC 22.0 (*)   ? Platelets 558 (*)   ? All other components within normal limits  ?LIPASE, BLOOD  ?URINALYSIS, ROUTINE W REFLEX MICROSCOPIC  ?SAMPLE TO BLOOD BANK  ? ? ?EKG ?Sinus rhythm, rate of 99 bpm.  Normal axis.  Incomplete left bundle.  No STEMI. ? ?RADIOLOGY ?CXR interpreted by me without evidence of acute cardiopulmonary pathology. ?CT abdomen/pelvis interpreted by me  without evidence of SBO ? ?Official radiology report(s): ?DG Chest 2 View ? ?Result Date: 11/02/2021 ?CLINICAL DATA:  Productive cough, COPD. EXAM: CHEST - 2 VIEW COMPARISON:  February 24, 2021. FINDINGS: The heart size and mediastinal contours are within normal limits. Both lungs are clear. The visualized skeletal structures are unremarkable. IMPRESSION: No active cardiopulmonary disease. Electronically Signed   By: Marijo Conception M.D.   On: 11/02/2021 16:52  ? ?CT ABDOMEN PELVIS W CONTRAST ? ?Result Date: 11/02/2021 ?CLINICAL DATA:  recurrent N/V/D EXAM: CT ABDOMEN AND PELVIS WITH CONTRAST TECHNIQUE: Multidetector CT imaging of the abdomen and pelvis was performed using the standard protocol following bolus administration of intravenous contrast. RADIATION DOSE REDUCTION: This exam was performed according to the departmental dose-optimization program which includes automated exposure control, adjustment of the mA and/or kV according to patient size and/or use of  iterative reconstruction technique. CONTRAST:  169m OMNIPAQUE IOHEXOL 300 MG/ML  SOLN COMPARISON:  CT dated February 24, 2021. FINDINGS: Lower chest: No acute abnormality.  Paraseptal emphysema. Hepatobiliary: Focal fatty deposition adjacent to the falciform ligament. Gallbladder is unremarkable. No intrahepatic or extrahepatic biliary ductal dilation. Pancreas: Unremarkable. No pancreatic ductal dilatation or surrounding inflammatory changes. Spleen: Normal in size without focal abnormality. Adrenals/Urinary Tract: Adrenal glands are unremarkable. No hydronephrosis. Kidneys enhance symmetrically. No obstructing nephrolithiasis. Subcentimeter hypodense lesions are too small to accurately characterize. Bladder is decompressed with evaluation limited by streak artifact from hip arthroplasty. Stomach/Bowel: No evidence of bowel obstruction. Small hiatal hernia. Appendix is normal. Colon is decompressed with mild prominence of the bowel walls diffusely throughout the colon with prominent enhancement of the submucosa. Prominent enhancement of the mucosa within the distal esophagus. Vascular/Lymphatic: Aortic atherosclerosis. No enlarged abdominal or pelvic lymph nodes. Reproductive: Prostate is unremarkable. Other: Pelvic mesh repair.  No free fluid or free air. Musculoskeletal: Spinal stimulator.  LEFT hip arthroplasty. IMPRESSION: 1. No suspicious CT etiology for recurring nausea and vomiting is identified. 2. Prominence of mucosal enhancement and bowel wall throughout the colon. This is favored to be due to bowel decompression / phase of contrast but could reflect nonspecific enteritis in the appropriate clinical setting. Aortic Atherosclerosis (ICD10-I70.0) and Emphysema (ICD10-J43.9). Electronically Signed   By: SValentino SaxonM.D.   On: 11/02/2021 16:53   ? ?PROCEDURES and INTERVENTIONS: ? ?.1-3 Lead EKG Interpretation ?Performed by: SVladimir Crofts MD ?Authorized by: SVladimir Crofts MD  ? ?  Interpretation:  abnormal   ?  ECG rate:  102 ?  ECG rate assessment: tachycardic   ?  Rhythm: sinus tachycardia   ?  Ectopy: none   ?  Conduction: normal   ? ?Medications  ?potassium chloride 10 mEq in 100 mL IVPB (0 mEq Intravenous Stopped 11/02/21 1823)  ?potassium chloride SA (KLOR-CON M) CR tablet 40 mEq (has no administration in time range)  ?magnesium oxide (MAG-OX) tablet 800 mg (has no administration in time range)  ?lactated ringers bolus 1,000 mL (1,000 mLs Intravenous New Bag/Given 11/02/21 1613)  ?ondansetron ([facility] injection 4 mg (4 mg Intravenous Given 11/02/21 1612)  ?iohexol (OMNIPAQUE) 300 MG/ML solution 100 mL (100 mLs Intravenous Contrast Given 11/02/21 1634)  ? ? ? ?IMPRESSION / MDM / ASSESSMENT AND PLAN / ED COURSE  ?I reviewed the triage vital signs and the nursing notes. ? ?Differential diagnosis includes, but is not limited to, small bowel obstruction, dehydration, acute cystitis, diverticulitis, AKI ? ?{Patient presents with symptoms of an acute illness or injury that is potentially life-threatening. ? ?56year old male presents to the  ED with nearly 2 weeks of recurrent emesis and diarrhea, possibly a viral gastroenteritis and ultimately suitable for trial of outpatient management.  He appears dry but has benign abdominal examination without tenderness or peritoneal features.  Blood work shows leukocytosis, which does seem chronic when looking back further and possibly due to steroids a hematologist provides for weight loss.  CMP with slight worsening of his CKD, but not clear AKI criteria.  Hypokalemia is noted and repleted orally and IV, will empirically replete magnesium orally as well. ?Due to his leukocytosis and symptoms, CT abdomen/pelvis obtained and without evidence of SBO or significant acute pathology.  Possibility of enteritis contributes to clinical likelihood of viral gastroenteritis.  He is requesting discharge and while I consider observation admission for this patient, trial of outpatient  management with Zofran is reasonable.  We discussed return precautions. ? ?Clinical Course as of 11/02/21 1824  ?Wed Nov 02, 2021  ?1731 When reviewing further records, looks like he is on daily Decadron to he

## 2021-11-02 NOTE — Telephone Encounter (Signed)
Ok thnx

## 2021-11-02 NOTE — Telephone Encounter (Signed)
Error

## 2021-11-02 NOTE — ED Provider Triage Note (Cosign Needed)
Emergency Medicine Provider Triage Evaluation Note ? ?Kevin Alexander , a 56 y.o. male  was evaluated in triage.  Pt complains of vomiting 6-7 times daily for 2 weeks. No abdominal pain. Feels cold, no fever. No hematemesis. Denies drinking from any rivers/lakes etc. No diarrhea. No head injury ? ?Review of Systems  ?Positive: vomiting ?Negative: Diarrhea, abdominal pain, fever ? ?Physical Exam  ?BP (!) 131/94 (BP Location: Right Arm)   Pulse (!) 115   Temp 98.3 ?F (36.8 ?C) (Oral)   Resp 16   Ht '5\' 5"'$  (1.651 m)   Wt 60.3 kg   SpO2 98%   BMI 22.12 kg/m?  ?Gen:   Awake, no distress   ?Resp:  Normal effort  ?MSK:   Moves extremities without difficulty  ?Other:   ? ?Medical Decision Making  ?Medically screening exam initiated at 3:16 PM.  Appropriate orders placed.  Kevin Alexander was informed that the remainder of the evaluation will be completed by another provider, this initial triage assessment does not replace that evaluation, and the importance of remaining in the ED until their evaluation is complete. ? ? ?  ?Marquette Old, PA-C ?11/02/21 1526 ? ?

## 2021-11-02 NOTE — ED Notes (Signed)
Potassium 2.5Tamala Julian, MD made aware ?

## 2021-11-02 NOTE — Progress Notes (Signed)
? ?Ht 5' 5.98" (1.676 m)   Wt 133 lb (60.3 kg)   SpO2 99%   BMI 21.48 kg/m?   ? ?Subjective:  ? ? Patient ID: Kevin Alexander, male    DOB: 11/02/1965, 56 y.o.   MRN: [id number] ? ?Chief Complaint  ?Patient presents with  ? Vomiting  ?  For the last week and 1/2 ?  ? Diarrhea  ? Fatigue  ? ? ?HPI: ?Kevin Alexander is a 56 y.o. male ? ?Patient presents with: ?Vomiting: For the last week and 1/2 ? ?Diarrhea ?Fatigue ? ?Per nursing staff, pts bp wasn't recordable on two different machines and manually. Pt looks very ill and sick and has been so for the last 2 weeks.  ?He had to rush to the bathroom to vomit while in the office today for his visit and wasn't able to talk much sec to nausea.  ? ? ? ?Diarrhea  ?This is a new (per his cousin who is his room mate as well, pt has had diarrhe and vomiting x 2 weeks. he hasnt been eating or drinking anything except for pepsi per her verbal record. pt was in the bathroom vomiting while this history was being obtained.) problem. The current episode started 1 to 4 weeks ago. The problem has been gradually worsening. Associated symptoms include chills, a fever, myalgias and vomiting. Pertinent negatives include no abdominal pain, arthralgias, bloating, coughing or URI.  ? ?Chief Complaint  ?Patient presents with  ? Vomiting  ?  For the last week and 1/2 ?  ? Diarrhea  ? Fatigue  ? ? ?Relevant past medical, surgical, family and social history reviewed and updated as indicated. Interim medical history since our last visit reviewed. ?Allergies and medications reviewed and updated. ? ?Review of Systems  ?Constitutional:  Positive for chills and fever.  ?Respiratory:  Negative for cough.   ?Gastrointestinal:  Positive for diarrhea and vomiting. Negative for abdominal pain and bloating.  ?Musculoskeletal:  Positive for myalgias. Negative for arthralgias.  ? ?Per HPI unless specifically indicated above ? ?   ?Objective:  ?  ?Ht 5' 5.98" (1.676 m)   Wt 133 lb (60.3 kg)   SpO2 99%    BMI 21.48 kg/m?   ?Wt Readings from Last 3 Encounters:  ?11/02/21 133 lb (60.3 kg)  ?08/04/21 147 lb 11.2 oz (67 kg)  ?08/01/21 150 lb (68 kg)  ?  ?Physical Exam ?Constitutional:   ?   General: He is in acute distress.  ?   Appearance: He is ill-appearing. He is not toxic-appearing or diaphoretic.  ?   Comments: Has been nauseous the entire visit and in the bathroom with VOmiting  ?HENT:  ?   Head: Normocephalic and atraumatic.  ?Skin: ?   Coloration: Skin is pale.  ?Neurological:  ?   Mental Status: He is alert.  ? ? ?Results for orders placed or performed in visit on 11/02/21  ?Veritor Flu A/B Waived  ?Result Value Ref Range  ? Influenza A Negative Negative  ? Influenza B Negative Negative  ? ?   ? ? ?Current Outpatient Medications:  ?  aspirin EC 81 MG tablet, Take 81 mg by mouth daily. Swallow whole., Disp: , Rfl:  ?  atorvastatin (LIPITOR) 20 MG tablet, Take 1 tablet (20 mg total) by mouth every morning., Disp: 90 tablet, Rfl: 1 ?  benzonatate (TESSALON) 200 MG capsule, Take 1 capsule (200 mg total) by mouth 2 (two) times daily as needed for cough., Disp: 20  capsule, Rfl: 0 ?  dexamethasone (DECADRON) 4 MG tablet, Take 0.5-1 tablets (2-4 mg total) by mouth daily as needed (low appetite)., Disp: 30 tablet, Rfl: 0 ?  DULoxetine (CYMBALTA) 30 MG capsule, Take 1 capsule (30 mg total) by mouth every morning. Take with 60 mg to equal 90 mg daily, Disp: 90 capsule, Rfl: 1 ?  DULoxetine (CYMBALTA) 60 MG capsule, Take 1 capsule (60 mg total) by mouth daily., Disp: 90 capsule, Rfl: 1 ?  Ferrous Sulfate (SLOW RELEASE IRON PO), Take 1 tablet by mouth daily., Disp: , Rfl:  ?  Multiple Vitamin (MULTIVITAMIN) tablet, Take 1 tablet by mouth daily., Disp: , Rfl:  ?  omeprazole (PRILOSEC) 40 MG capsule, Take 1 capsule (40 mg total) by mouth every morning., Disp: 90 capsule, Rfl: 1 ?  pramipexole (MIRAPEX) 0.125 MG tablet, Take 1 tablet (0.125 mg total) by mouth at bedtime as needed., Disp: 90 tablet, Rfl: 1 ?  verapamil  (CALAN-SR) 180 MG CR tablet, Take 1 tablet (180 mg total) by mouth at bedtime., Disp: 30 tablet, Rfl: 0 ?  Vitamin D, Ergocalciferol, (DRISDOL) 1.25 MG (50000 UNIT) CAPS capsule, Take 1 capsule (50,000 Units total) by mouth every Thursday., Disp: 12 capsule, Rfl: 3 ?  albuterol (2.5 MG/3ML) 0.083% NEBU 3 mL, albuterol (5 MG/ML) 0.5% NEBU 0.5 mL, Inhale 1 mg into the lungs every 6 (six) hours. (Patient not taking: Reported on 11/02/2021), Disp: , Rfl:  ?  benzonatate (TESSALON) 100 MG capsule, Take 1 capsule (100 mg total) by mouth 2 (two) times daily as needed for cough. (Patient not taking: Reported on 11/02/2021), Disp: 20 capsule, Rfl: 0 ?  diltiazem (CARDIZEM CD) 120 MG 24 hr capsule, Take 120 mg by mouth daily. (Patient not taking: Reported on 11/02/2021), Disp: , Rfl:  ?  lactobacillus acidophilus & bulgar (LACTINEX) chewable tablet, Chew 1 tablet by mouth as needed. Take after every loose stool (Patient not taking: Reported on 11/02/2021), Disp: 30 tablet, Rfl: 1 ?  levocetirizine (XYZAL) 5 MG tablet, Take 1 tablet (5 mg total) by mouth at bedtime. (Patient not taking: Reported on 11/02/2021), Disp: 90 tablet, Rfl: 1 ?  montelukast (SINGULAIR) 10 MG tablet, Take 10 mg by mouth at bedtime.  (Patient not taking: Reported on 11/02/2021), Disp: , Rfl:  ?  nitroGLYCERIN (NITROSTAT) 0.4 MG SL tablet, Place 1 tablet (0.4 mg total) under the tongue every 5 (five) minutes as needed for chest pain. (Patient not taking: Reported on 11/02/2021), Disp: 30 tablet, Rfl: 0 ?  ondansetron (ZOFRAN ODT) 4 MG disintegrating tablet, Take 1 tablet (4 mg total) by mouth every 8 (eight) hours as needed for nausea or vomiting. (Patient not taking: Reported on 11/02/2021), Disp: 20 tablet, Rfl: 0 ?  potassium chloride (KLOR-CON) 10 MEQ tablet, Take 10 mEq by mouth daily as needed (low energy/weaknesss). (Patient not taking: Reported on 11/02/2021), Disp: , Rfl:  ?  thiamine (VITAMIN B-1) 100 MG tablet, Take 1 tablet (100 mg total) by mouth  daily. (Patient not taking: Reported on 11/02/2021), Disp: 90 tablet, Rfl: 1  ? ? ?Assessment & Plan:  ?Pts bp as unrecordable on arrival, he had a very thready and fast HR at 140, pt went to the bathroom sec to vomiting and had been in there for over 10 mins until he was escorted out of there to the ER. EMS was called and pt escorted to the ER.  ?     Pt was swabbed for the FLU AND COVID.  ? ? ?  OF NOTE :  ?Per EMS readings HR was 147 bpm and BP was low in the 543'K systolic. ?Problem List Items Addressed This Visit   ? ?  ? Respiratory  ? Upper respiratory tract infection - Primary  ? Relevant Orders  ? Novel Coronavirus, NAA ([facility])  ? Veritor Flu A/B Waived (Completed)  ?  ? Digestive  ? Nausea and vomiting  ?  ? ?Orders Placed This Encounter  ?Procedures  ? Novel Coronavirus, NAA ([facility])  ? Veritor Flu A/B Waived  ?  ? ?No orders of the defined types were placed in this encounter. ?  ? ?Follow up plan: ?No follow-ups on file. ? ? ?

## 2021-11-02 NOTE — Telephone Encounter (Signed)
Needs to go to the ER please let him know if hes having morning sicknedss nausea needs Imaging now. Thnx.

## 2021-11-02 NOTE — ED Notes (Signed)
Pt addiment about leaving and wants his IV that was placed by EMS removed as well as arm bands, IV removed with catheter intact, ID and blood bank band removed, pt sitting outside., upon disposition of the pt, noted pt has WBC of 22, spoke with the pt again and agreed to come back in if he would be seen immediately by a provider ?

## 2021-11-02 NOTE — Telephone Encounter (Signed)
Called and spoke with patient. He states that he does not want to go to the ER. I asked the patient how he was feeling and he states that he is a little bit better. States he did not get sick last night like he was before but that he did a little bit this morning. Patient states that he will be here this afternoon for his appointment.  ?

## 2021-11-02 NOTE — ED Triage Notes (Signed)
Pt comes into the ED via EMS from [facility] practice with N/V/D for the past 2 weeks  with intermittent abd pain.Marland Kitchen  ?HR120-140 ST ?CO 2  20 ?RR22-26 ?CBG 198 ?#20gRAC ?Zofran '4mg'$   ?NS infusing on arrival, 550m ? ?

## 2021-11-03 LAB — NOVEL CORONAVIRUS, NAA: SARS-CoV-2, NAA: NOT DETECTED

## 2021-11-11 ENCOUNTER — Ambulatory Visit: Payer: Medicare Other | Admitting: Nurse Practitioner

## 2021-11-16 ENCOUNTER — Ambulatory Visit: Payer: Medicare Other | Admitting: Nurse Practitioner

## 2021-11-16 NOTE — Progress Notes (Deleted)
There were no vitals taken for this visit.   Subjective:    Patient ID: Kevin Alexander, male    DOB: January 03, 1966, 56 y.o.   MRN: [id number]  HPI: Kevin Alexander is a 56 y.o. male  No chief complaint on file.  RESTLESS LEGS Duration: years Discomfort description:   jumping Pain: yes Location:  whole legs Bilateral: yes Symmetric:  left is worse Severity: 8/10 Onset:  sudden Frequency:  constant Symptoms only occur while legs at rest: yes Sudden unintentional leg jerking: yes Bed partner bothered by leg movements: no LE numbness: yes Decreased sensation: no Weakness: yes Insomnia: yes Daytime somnolence: no Fatigue: yes Alleviating factors:  Aggravating factors:  Status: better Treatments attempted:    Relevant past medical, surgical, family and social history reviewed and updated as indicated. Interim medical history since our last visit reviewed. Allergies and medications reviewed and updated.  Review of Systems  Musculoskeletal:        RLS   Per HPI unless specifically indicated above     Objective:    There were no vitals taken for this visit.  Wt Readings from Last 3 Encounters:  11/02/21 132 lb 15 oz (60.3 kg)  11/02/21 133 lb (60.3 kg)  08/04/21 147 lb 11.2 oz (67 kg)    Physical Exam Vitals and nursing note reviewed.  Constitutional:      General: He is not in acute distress.    Appearance: Normal appearance. He is not ill-appearing, toxic-appearing or diaphoretic.  HENT:     Head: Normocephalic.     Right Ear: External ear normal.     Left Ear: External ear normal.     Nose: Nose normal. No congestion or rhinorrhea.     Mouth/Throat:     Mouth: Mucous membranes are moist.  Eyes:     General:        Right eye: No discharge.        Left eye: No discharge.     Extraocular Movements: Extraocular movements intact.     Conjunctiva/sclera: Conjunctivae normal.     Pupils: Pupils are equal, round, and reactive to light.  Cardiovascular:      Rate and Rhythm: Normal rate and regular rhythm.     Heart sounds: No murmur heard. Pulmonary:     Effort: Pulmonary effort is normal. No respiratory distress.     Breath sounds: Normal breath sounds. No wheezing, rhonchi or rales.  Abdominal:     General: Abdomen is flat. Bowel sounds are normal.  Musculoskeletal:     Cervical back: Normal range of motion and neck supple.  Skin:    General: Skin is warm and dry.     Capillary Refill: Capillary refill takes less than 2 seconds.  Neurological:     General: No focal deficit present.     Mental Status: He is alert and oriented to person, place, and time.  Psychiatric:        Mood and Affect: Mood normal.        Behavior: Behavior normal.        Thought Content: Thought content normal.        Judgment: Judgment normal.    Results for orders placed or performed during the hospital encounter of 11/02/21  Lipase, blood  Result Value Ref Range   Lipase 33 11 - 51 U/L  Comprehensive metabolic panel  Result Value Ref Range   Sodium 132 (L) 135 - 145 mmol/L   Potassium 2.5 (LL) 3.5 -  5.1 mmol/L   Chloride 99 98 - 111 mmol/L   CO2 22 22 - 32 mmol/L   Glucose, Bld 173 (H) 70 - 99 mg/dL   BUN 7 6 - 20 mg/dL   Creatinine, Ser 1.35 (H) 0.61 - 1.24 mg/dL   Calcium 7.9 (L) 8.9 - 10.3 mg/dL   Total Protein 5.8 (L) 6.5 - 8.1 g/dL   Albumin 2.6 (L) 3.5 - 5.0 g/dL   AST 21 15 - 41 U/L   ALT 12 0 - 44 U/L   Alkaline Phosphatase 168 (H) 38 - 126 U/L   Total Bilirubin 0.2 (L) 0.3 - 1.2 mg/dL   GFR, Estimated >60 >60 mL/min   Anion gap 11 5 - 15  CBC  Result Value Ref Range   WBC 22.0 (H) 4.0 - 10.5 K/uL   RBC 4.72 4.22 - 5.81 MIL/uL   Hemoglobin 14.3 13.0 - 17.0 g/dL   HCT 42.3 39.0 - 52.0 %   MCV 89.6 80.0 - 100.0 fL   MCH 30.3 26.0 - 34.0 pg   MCHC 33.8 30.0 - 36.0 g/dL   RDW 13.4 11.5 - 15.5 %   Platelets 558 (H) 150 - 400 K/uL   nRBC 0.0 0.0 - 0.2 %  Sample to Blood Bank  Result Value Ref Range   Blood Bank Specimen SAMPLE  AVAILABLE FOR TESTING    Sample Expiration      11/05/2021,2359 Performed at [facility], [address], [place], [place] [address]       Assessment & Plan:   Problem List Items Addressed This Visit   None    Follow up plan: No follow-ups on file.

## 2021-11-23 ENCOUNTER — Emergency Department
Admission: EM | Admit: 2021-11-23 | Discharge: 2021-11-24 | Discharge status: Against Medical Advice | Payer: Medicare Other | Attending: Emergency Medicine | Admitting: Emergency Medicine

## 2021-11-23 DIAGNOSIS — Z5321 Procedure and treatment not carried out due to patient leaving prior to being seen by health care provider: Secondary | ICD-10-CM | POA: Insufficient documentation

## 2021-11-23 DIAGNOSIS — R112 Nausea with vomiting, unspecified: Secondary | ICD-10-CM | POA: Insufficient documentation

## 2021-11-23 LAB — CBC
HCT: 45.9 % (ref 39.0–52.0)
Hemoglobin: 15.2 g/dL (ref 13.0–17.0)
MCH: 29.7 pg (ref 26.0–34.0)
MCHC: 33.1 g/dL (ref 30.0–36.0)
MCV: 89.8 fL (ref 80.0–100.0)
Platelets: 584 10*3/uL — ABNORMAL HIGH (ref 150–400)
RBC: 5.11 MIL/uL (ref 4.22–5.81)
RDW: 13.9 % (ref 11.5–15.5)
WBC: 26.8 10*3/uL — ABNORMAL HIGH (ref 4.0–10.5)
nRBC: 0 % (ref 0.0–0.2)

## 2021-11-23 LAB — COMPREHENSIVE METABOLIC PANEL
ALT: 9 U/L (ref 0–44)
AST: 18 U/L (ref 15–41)
Albumin: 2.3 g/dL — ABNORMAL LOW (ref 3.5–5.0)
Alkaline Phosphatase: 185 U/L — ABNORMAL HIGH (ref 38–126)
Anion gap: 11 (ref 5–15)
BUN: 7 mg/dL (ref 6–20)
CO2: 22 mmol/L (ref 22–32)
Calcium: 8.4 mg/dL — ABNORMAL LOW (ref 8.9–10.3)
Chloride: 102 mmol/L (ref 98–111)
Creatinine, Ser: 1.21 mg/dL (ref 0.61–1.24)
GFR, Estimated: >60 mL/min (ref >60)
Glucose, Bld: 127 mg/dL — ABNORMAL HIGH (ref 70–99)
Potassium: 2.7 mmol/L — CL (ref 3.5–5.1)
Sodium: 135 mmol/L (ref 135–145)
Total Bilirubin: 0.3 mg/dL (ref 0.3–1.2)
Total Protein: 5.9 g/dL — ABNORMAL LOW (ref 6.5–8.1)

## 2021-11-23 LAB — LIPASE, BLOOD: Lipase: 28 U/L (ref 11–51)

## 2021-11-23 LAB — LACTIC ACID, PLASMA: Lactic Acid, Venous: 3.4 mmol/L (ref 0.5–1.9)

## 2021-11-23 NOTE — ED Notes (Signed)
No answer when called several times from lobby 

## 2021-11-23 NOTE — ED Notes (Signed)
Pt walking in and out of lobby from outside.  Pt seen being wheeled out of ED by visitor.

## 2021-11-23 NOTE — ED Notes (Signed)
Pt declined to provided a urine sample at this time. He was informed that we would want him to refrain from consuming beverages until he's seen by a provider in which he refused and consumed his gingerale in triage.

## 2021-11-23 NOTE — ED Triage Notes (Signed)
Pt presents via POV with complaints of N/V for the last month - he was seen here 3 weeks ago for the same sx where he "got fluids and was sent home" per pt. He states that his emesis is thick mucus in consistency. Denies CP or SOB.

## 2021-11-24 ENCOUNTER — Telehealth: Payer: Self-pay | Admitting: Nurse Practitioner

## 2021-11-24 NOTE — ED Notes (Addendum)
Called phone # listed in chart; pt's SO answers Kevin Alexander); st pt was tired of waiting and left and is now at home; informed that pt has abnormal results and strongly recommended pt return immediately; pt refuses; encouraged SO to speak further with pt and return for further care

## 2021-11-24 NOTE — ED Notes (Signed)
No answer when called several times from lobby 

## 2021-11-24 NOTE — Telephone Encounter (Signed)
I was contacted by an RN in the ER who states patient left without being seen.  His Potassium, WBC and Lactic acid were abnormal.  I called and recommended patient go back to the ER for further evaluation and management.  I had to leave a message for the patient due to him not answering.

## 2021-11-28 LAB — CULTURE, BLOOD (SINGLE)
Culture: NO GROWTH
Special Requests: ADEQUATE

## 2021-11-30 ENCOUNTER — Encounter: Payer: Self-pay | Admitting: Emergency Medicine

## 2021-11-30 ENCOUNTER — Other Ambulatory Visit: Payer: Self-pay

## 2021-11-30 ENCOUNTER — Inpatient Hospital Stay: Payer: Medicare Other

## 2021-11-30 ENCOUNTER — Inpatient Hospital Stay
Admission: EM | Admit: 2021-11-30 | Discharge: 2021-12-02 | DRG: 391 | Disposition: A | Discharge status: Home / Self Care | Payer: Medicare Other | Attending: Internal Medicine | Admitting: Internal Medicine

## 2021-11-30 DIAGNOSIS — R112 Nausea with vomiting, unspecified: Secondary | ICD-10-CM | POA: Diagnosis not present

## 2021-11-30 DIAGNOSIS — E559 Vitamin D deficiency, unspecified: Secondary | ICD-10-CM | POA: Diagnosis present

## 2021-11-30 DIAGNOSIS — E876 Hypokalemia: Secondary | ICD-10-CM | POA: Diagnosis present

## 2021-11-30 DIAGNOSIS — G2581 Restless legs syndrome: Secondary | ICD-10-CM | POA: Diagnosis present

## 2021-11-30 DIAGNOSIS — Z7982 Long term (current) use of aspirin: Secondary | ICD-10-CM

## 2021-11-30 DIAGNOSIS — Z96642 Presence of left artificial hip joint: Secondary | ICD-10-CM | POA: Diagnosis present

## 2021-11-30 DIAGNOSIS — G629 Polyneuropathy, unspecified: Secondary | ICD-10-CM | POA: Diagnosis present

## 2021-11-30 DIAGNOSIS — R627 Adult failure to thrive: Secondary | ICD-10-CM | POA: Diagnosis present

## 2021-11-30 DIAGNOSIS — G8929 Other chronic pain: Secondary | ICD-10-CM | POA: Diagnosis present

## 2021-11-30 DIAGNOSIS — J449 Chronic obstructive pulmonary disease, unspecified: Secondary | ICD-10-CM | POA: Diagnosis present

## 2021-11-30 DIAGNOSIS — Z823 Family history of stroke: Secondary | ICD-10-CM | POA: Diagnosis not present

## 2021-11-30 DIAGNOSIS — K222 Esophageal obstruction: Secondary | ICD-10-CM | POA: Diagnosis present

## 2021-11-30 DIAGNOSIS — D72829 Elevated white blood cell count, unspecified: Secondary | ICD-10-CM | POA: Diagnosis present

## 2021-11-30 DIAGNOSIS — Z8614 Personal history of Methicillin resistant Staphylococcus aureus infection: Secondary | ICD-10-CM

## 2021-11-30 DIAGNOSIS — E782 Mixed hyperlipidemia: Secondary | ICD-10-CM | POA: Diagnosis present

## 2021-11-30 DIAGNOSIS — E871 Hypo-osmolality and hyponatremia: Secondary | ICD-10-CM | POA: Diagnosis present

## 2021-11-30 DIAGNOSIS — M549 Dorsalgia, unspecified: Secondary | ICD-10-CM | POA: Diagnosis present

## 2021-11-30 DIAGNOSIS — Z888 Allergy status to other drugs, medicaments and biological substances status: Secondary | ICD-10-CM

## 2021-11-30 DIAGNOSIS — I10 Essential (primary) hypertension: Secondary | ICD-10-CM | POA: Diagnosis present

## 2021-11-30 DIAGNOSIS — M47816 Spondylosis without myelopathy or radiculopathy, lumbar region: Secondary | ICD-10-CM | POA: Diagnosis present

## 2021-11-30 DIAGNOSIS — I252 Old myocardial infarction: Secondary | ICD-10-CM

## 2021-11-30 DIAGNOSIS — Z801 Family history of malignant neoplasm of trachea, bronchus and lung: Secondary | ICD-10-CM | POA: Diagnosis not present

## 2021-11-30 DIAGNOSIS — F17210 Nicotine dependence, cigarettes, uncomplicated: Secondary | ICD-10-CM | POA: Diagnosis present

## 2021-11-30 DIAGNOSIS — R531 Weakness: Secondary | ICD-10-CM

## 2021-11-30 DIAGNOSIS — A0472 Enterocolitis due to Clostridium difficile, not specified as recurrent: Secondary | ICD-10-CM | POA: Diagnosis present

## 2021-11-30 DIAGNOSIS — Z79899 Other long term (current) drug therapy: Secondary | ICD-10-CM

## 2021-11-30 DIAGNOSIS — E43 Unspecified severe protein-calorie malnutrition: Secondary | ICD-10-CM | POA: Diagnosis present

## 2021-11-30 DIAGNOSIS — M5136 Other intervertebral disc degeneration, lumbar region: Secondary | ICD-10-CM | POA: Diagnosis present

## 2021-11-30 DIAGNOSIS — R197 Diarrhea, unspecified: Secondary | ICD-10-CM | POA: Diagnosis present

## 2021-11-30 DIAGNOSIS — K219 Gastro-esophageal reflux disease without esophagitis: Secondary | ICD-10-CM | POA: Diagnosis present

## 2021-11-30 DIAGNOSIS — K2970 Gastritis, unspecified, without bleeding: Secondary | ICD-10-CM | POA: Diagnosis present

## 2021-11-30 DIAGNOSIS — Z87442 Personal history of urinary calculi: Secondary | ICD-10-CM

## 2021-11-30 DIAGNOSIS — Z8249 Family history of ischemic heart disease and other diseases of the circulatory system: Secondary | ICD-10-CM

## 2021-11-30 DIAGNOSIS — Z8261 Family history of arthritis: Secondary | ICD-10-CM

## 2021-11-30 DIAGNOSIS — Z8711 Personal history of peptic ulcer disease: Secondary | ICD-10-CM

## 2021-11-30 DIAGNOSIS — Z86010 Personal history of colonic polyps: Secondary | ICD-10-CM

## 2021-11-30 DIAGNOSIS — I2510 Atherosclerotic heart disease of native coronary artery without angina pectoris: Secondary | ICD-10-CM | POA: Diagnosis present

## 2021-11-30 HISTORY — DX: Diarrhea, unspecified: R19.7

## 2021-11-30 LAB — CBC
HCT: 33.3 % — ABNORMAL LOW (ref 39.0–52.0)
Hemoglobin: 11.2 g/dL — ABNORMAL LOW (ref 13.0–17.0)
MCH: 29.7 pg (ref 26.0–34.0)
MCHC: 33.6 g/dL (ref 30.0–36.0)
MCV: 88.3 fL (ref 80.0–100.0)
Platelets: 454 10*3/uL — ABNORMAL HIGH (ref 150–400)
RBC: 3.77 MIL/uL — ABNORMAL LOW (ref 4.22–5.81)
RDW: 13.9 % (ref 11.5–15.5)
WBC: 18.6 10*3/uL — ABNORMAL HIGH (ref 4.0–10.5)
nRBC: 0 % (ref 0.0–0.2)

## 2021-11-30 LAB — COMPREHENSIVE METABOLIC PANEL
ALT: 8 U/L (ref 0–44)
AST: 16 U/L (ref 15–41)
Albumin: 1.9 g/dL — ABNORMAL LOW (ref 3.5–5.0)
Alkaline Phosphatase: 144 U/L — ABNORMAL HIGH (ref 38–126)
Anion gap: 8 (ref 5–15)
BUN: <5 mg/dL — ABNORMAL LOW (ref 6–20)
CO2: 23 mmol/L (ref 22–32)
Calcium: 7.6 mg/dL — ABNORMAL LOW (ref 8.9–10.3)
Chloride: 99 mmol/L (ref 98–111)
Creatinine, Ser: 1.07 mg/dL (ref 0.61–1.24)
GFR, Estimated: >60 mL/min (ref >60)
Glucose, Bld: 159 mg/dL — ABNORMAL HIGH (ref 70–99)
Potassium: <2 mmol/L — CL (ref 3.5–5.1)
Sodium: 130 mmol/L — ABNORMAL LOW (ref 135–145)
Total Bilirubin: 0.7 mg/dL (ref 0.3–1.2)
Total Protein: 4.9 g/dL — ABNORMAL LOW (ref 6.5–8.1)

## 2021-11-30 LAB — CBC WITH DIFFERENTIAL/PLATELET
Abs Immature Granulocytes: 0.2 10*3/uL — ABNORMAL HIGH (ref 0.00–0.07)
Basophils Absolute: 0.1 10*3/uL (ref 0.0–0.1)
Basophils Relative: 0 %
Eosinophils Absolute: 0 10*3/uL (ref 0.0–0.5)
Eosinophils Relative: 0 %
HCT: 37.2 % — ABNORMAL LOW (ref 39.0–52.0)
Hemoglobin: 12.5 g/dL — ABNORMAL LOW (ref 13.0–17.0)
Immature Granulocytes: 1 %
Lymphocytes Relative: 10 %
Lymphs Abs: 1.8 10*3/uL (ref 0.7–4.0)
MCH: 29.6 pg (ref 26.0–34.0)
MCHC: 33.6 g/dL (ref 30.0–36.0)
MCV: 88.2 fL (ref 80.0–100.0)
Monocytes Absolute: 1.1 10*3/uL — ABNORMAL HIGH (ref 0.1–1.0)
Monocytes Relative: 6 %
Neutro Abs: 14.9 10*3/uL — ABNORMAL HIGH (ref 1.7–7.7)
Neutrophils Relative %: 83 %
Platelets: 456 10*3/uL — ABNORMAL HIGH (ref 150–400)
RBC: 4.22 MIL/uL (ref 4.22–5.81)
RDW: 13.9 % (ref 11.5–15.5)
WBC: 18.1 10*3/uL — ABNORMAL HIGH (ref 4.0–10.5)
nRBC: 0 % (ref 0.0–0.2)

## 2021-11-30 LAB — LACTIC ACID, PLASMA: Lactic Acid, Venous: 2.2 mmol/L (ref 0.5–1.9)

## 2021-11-30 LAB — LIPASE, BLOOD: Lipase: 27 U/L (ref 11–51)

## 2021-11-30 LAB — MAGNESIUM: Magnesium: 1.6 mg/dL — ABNORMAL LOW (ref 1.7–2.4)

## 2021-11-30 MED ORDER — POTASSIUM CHLORIDE 2 MEQ/ML IV SOLN
INTRAVENOUS | Status: DC
  Filled 2021-11-30 (×6): qty 1000

## 2021-11-30 MED ORDER — SODIUM CHLORIDE 0.9 % IV BOLUS
1000.0000 mL | Freq: Once | INTRAVENOUS | Status: AC
  Administered 2021-11-30: 1000 mL via INTRAVENOUS

## 2021-11-30 MED ORDER — ACETAMINOPHEN 325 MG PO TABS: 650.0000 mg | ORAL_TABLET | Freq: Four times a day (QID) | ORAL | Status: DC | PRN

## 2021-11-30 MED ORDER — ENOXAPARIN SODIUM 40 MG/0.4ML IJ SOSY
40.0000 mg | PREFILLED_SYRINGE | INTRAMUSCULAR | Status: DC
  Administered 2021-11-30 – 2021-12-01 (×2): 40 mg via SUBCUTANEOUS
  Filled 2021-11-30 (×2): qty 0.4

## 2021-11-30 MED ORDER — ACETAMINOPHEN 650 MG RE SUPP: 650.0000 mg | Freq: Four times a day (QID) | RECTAL | Status: DC | PRN

## 2021-11-30 MED ORDER — POTASSIUM CHLORIDE 10 MEQ/100ML IV SOLN
10.0000 meq | INTRAVENOUS | Status: AC
  Administered 2021-11-30 (×4): 10 meq via INTRAVENOUS
  Filled 2021-11-30 (×4): qty 100

## 2021-11-30 MED ORDER — ONDANSETRON HCL 4 MG/2ML IJ SOLN: 4.0000 mg | Freq: Four times a day (QID) | INTRAMUSCULAR | Status: DC | PRN

## 2021-11-30 MED ORDER — MAGNESIUM SULFATE 2 GM/50ML IV SOLN
2.0000 g | Freq: Once | INTRAVENOUS | Status: AC
  Administered 2021-11-30: 2 g via INTRAVENOUS
  Filled 2021-11-30: qty 50

## 2021-11-30 MED ORDER — HYDRALAZINE HCL 20 MG/ML IJ SOLN: 10.0000 mg | Freq: Four times a day (QID) | INTRAMUSCULAR | Status: DC | PRN

## 2021-11-30 MED ORDER — POTASSIUM CHLORIDE CRYS ER 20 MEQ PO TBCR
40.0000 meq | EXTENDED_RELEASE_TABLET | Freq: Once | ORAL | Status: AC
Start: 2021-11-30 — End: 2021-11-30
  Administered 2021-11-30: 40 meq via ORAL
  Filled 2021-11-30: qty 2

## 2021-11-30 MED ORDER — PROCHLORPERAZINE EDISYLATE 10 MG/2ML IJ SOLN: 10.0000 mg | Freq: Four times a day (QID) | INTRAMUSCULAR | Status: DC | PRN

## 2021-11-30 NOTE — Assessment & Plan Note (Addendum)
Due to N/V and diarrhea. Potassium less than 2.0 on admission. Replacement started in the ED with K riders. K today 2.5 -- Add'l K riders ordered -- continue LR +20 mEq KCl at 100 cc/h -- Repeat BMP in morning

## 2021-11-30 NOTE — ED Provider Notes (Signed)
[facility] Provider Note    Event Date/Time   First MD Initiated Contact with Patient 11/30/21 1533     (approximate)   History   CC: Nausea and vomiting   HPI  Kevin Alexander is a 56 y.o. male  who, per family practice note dated 11/24/21 had lab work showing abnormal potassium, WBC and lactic acid level, who presents to the emergency department today with primary complaint of nausea and vomiting.  The patient says that it has been roughly 3 weeks that he has had persistent nausea and vomiting.  He is unable to keep anything down except for an occasional soda or ginger ale.  He has had associated diarrhea.  Denies noticing blood in either the vomit or diarrhea.  Occasionally will have generalized abdominal pain with his symptoms.  Denies any fevers.      Physical Exam   Triage Vital Signs: ED Triage Vitals  Enc Vitals Group     BP 11/30/21 1515 103/80     Pulse Rate 11/30/21 1515 (!) 122     Resp --      Temp 11/30/21 1515 98.2 F (36.8 C)     Temp Source 11/30/21 1515 Oral     SpO2 11/30/21 1515 99 %     Weight 11/30/21 1516 130 lb (59 kg)     Height 11/30/21 1516 '5\' 7"'$  (1.702 m)     Head Circumference --      Peak Flow --      Pain Score 11/30/21 1516 8     Pain Loc --      Pain Edu? --      Excl. in [place]? --     Most recent vital signs: Vitals:   11/30/21 1515  BP: 103/80  Pulse: (!) 122  Temp: 98.2 F (36.8 C)  SpO2: 99%    General: Awake, alert, oriented. CV:  Good peripheral perfusion. Tachycardia, regular rhythm Resp:  Normal effort. Lungs clear. Abd:  No distention. Minimal tenderness to palpation diffusely.  ED Results / Procedures / Treatments   Labs (all labs ordered are listed, but only abnormal results are displayed) Labs Reviewed  CBC WITH DIFFERENTIAL/PLATELET - Abnormal; Notable for the following components:      Result Value   WBC 18.1 (*)    Hemoglobin 12.5 (*)    HCT 37.2 (*)    Platelets 456 (*)     Neutro Abs 14.9 (*)    Monocytes Absolute 1.1 (*)    Abs Immature Granulocytes 0.20 (*)    All other components within normal limits  MAGNESIUM - Abnormal; Notable for the following components:   Magnesium 1.6 (*)    All other components within normal limits  COMPREHENSIVE METABOLIC PANEL - Abnormal; Notable for the following components:   Sodium 130 (*)    Potassium <2.0 (*)    Glucose, Bld 159 (*)    BUN <5 (*)    Calcium 7.6 (*)    Total Protein 4.9 (*)    Albumin 1.9 (*)    Alkaline Phosphatase 144 (*)    All other components within normal limits  C DIFFICILE QUICK SCREEN W PCR REFLEX    GASTROINTESTINAL PANEL BY PCR, STOOL (REPLACES STOOL CULTURE)  LIPASE, BLOOD  HIV ANTIBODY (ROUTINE TESTING W REFLEX)  CBC  COMPREHENSIVE METABOLIC PANEL  CBC  MAGNESIUM  LACTIC ACID, PLASMA  URINALYSIS, COMPLETE (UACMP) WITH MICROSCOPIC     EKG  I, Nance Pear, attending physician, personally viewed  and interpreted this EKG  EKG Time: 1519 Rate: 120 Rhythm: sinus tachycardia Axis: left axis deviation Intervals: qtc 491 QRS: LVH ST changes: no st elevation Impression: abnormal ekg  RADIOLOGY None   PROCEDURES:  Critical Care performed: Yes, see critical care procedure note(s)  Procedures  CRITICAL CARE Performed by: Nance Pear   Total critical care time: 30 minutes  Critical care time was exclusive of separately billable procedures and treating other patients.  Critical care was necessary to treat or prevent imminent or life-threatening deterioration.  Critical care was time spent personally by me on the following activities: development of treatment plan with patient and/or surrogate as well as nursing, discussions with consultants, evaluation of patient's response to treatment, examination of patient, obtaining history from patient or surrogate, ordering and performing treatments and interventions, ordering and review of laboratory studies, ordering and  review of radiographic studies, pulse oximetry and re-evaluation of patient's condition.   MEDICATIONS ORDERED IN ED: Medications - No data to display   IMPRESSION / MDM / [place] / ED COURSE  I reviewed the triage vital signs and the nursing notes.                              Differential diagnosis includes, but is not limited to, hypokalemia, pancreatitis, hepatitis.  Patient's presentation is most consistent with acute presentation with potential threat to life or bodily function.  Patient presented to the emergency department today as advised by nephrology because of concerns for abnormal blood work.  Patient has been having issues with nausea vomiting and diarrhea for a few weeks.  Blood work here is concerning for significant hypokalemia.  Also slight hypomagnesemia.  EKG without concerning abnormalities.  Patient was given both IV and oral potassium here in the emergency department.  He was started on IV magnesium and fluids as well.  Given the significance of hypokalemia discussed with Dr. Arbutus Ped with the hospitalist service who will plan on admission.  FINAL CLINICAL IMPRESSION(S) / ED DIAGNOSES   Final diagnoses:  Hypokalemia      Note:  This document was prepared using Dragon voice recognition software and may include unintentional dictation errors.    Nance Pear, MD 11/30/21 [phone]

## 2021-11-30 NOTE — ED Notes (Signed)
Per receiving RN on 2C, "bring pt up."

## 2021-11-30 NOTE — Assessment & Plan Note (Addendum)
BP currently controlled. Verapamil was held due to nausea vomiting and BPs are controlled and at times soft. Stop verapamil at discharge, monitor BPs and follow-up with primary care closely.

## 2021-11-30 NOTE — Assessment & Plan Note (Addendum)
Noted.  Resume vitamin D supplement when  p.o. intake improves

## 2021-11-30 NOTE — ED Notes (Signed)
Informed RN bed assigned 

## 2021-11-30 NOTE — Assessment & Plan Note (Addendum)
Takes Prilosec.  Allergy of N/V to Protonix noted. Resume Prilosec at discharge.

## 2021-11-30 NOTE — Assessment & Plan Note (Signed)
Stable

## 2021-11-30 NOTE — Assessment & Plan Note (Addendum)
Secondary to C. difficile infection.  With associated nausea vomiting.  Significant leukocytosis of 26.8 when seen in the ED a week ago, down to 18.1 on admission.  6/15 C. difficile returned positive (toxin and PCR). -- Management as outlined

## 2021-11-30 NOTE — Assessment & Plan Note (Addendum)
History of gastritis.  This could be etiology of his nausea vomiting, however now with C. difficile positive less suspicion for gastritis causing acute illness.

## 2021-11-30 NOTE — Assessment & Plan Note (Signed)
Secondary to lumbar DJD. Pain control as needed

## 2021-11-30 NOTE — Assessment & Plan Note (Addendum)
Resolved.  Mg 1.6 on admission, replacement started in the ED.  Due to nausea vomiting. --Repeat Mg with AM labs

## 2021-11-30 NOTE — Assessment & Plan Note (Addendum)
Lipitor was held due to nausea vomiting, resume at discharge

## 2021-11-30 NOTE — Assessment & Plan Note (Addendum)
Body mass index is 20.36 kg/m. Albumin 1.9 on admission.   Dietitian consult. Resume multivitamin and thiamine.

## 2021-11-30 NOTE — Assessment & Plan Note (Addendum)
Etiology unclear.  CT abdomen pelvis on 5/17 was without any acute findings other than some nonspecific bowel wall enhancement that could indicate an enteritis. CT abdomen pelvis on admission 6/15 showed mild diffuse wall, decreased mucosal suggesting possible chronic colitis no pericolic stranding or fluid collections were seen. Stool studies positive for C. difficile, started on oral vancomycin. -- Treated with IV Zofran, Compazine as needed -- Magnesium and potassium were replaced, sodium normalized with IV hydration -- Tolerating usual diet and off IV fluids  Clinically improved, tolerating oral nutrition and hydration.  Medically stable for discharge to continue antibiotics for C. Difficile.

## 2021-11-30 NOTE — Assessment & Plan Note (Addendum)
Pain control as needed No PT evaluation needed, patient ambulating well independently.

## 2021-11-30 NOTE — Assessment & Plan Note (Signed)
Appears chronic for quite some time. Body mass index is 20.36 kg/m. -- Dietitian consulted -- Consider appetite stimulant once tolerating p.o.

## 2021-11-30 NOTE — Assessment & Plan Note (Addendum)
Sodium 130 on admission.  He follows with nephrology for this ongoing issue. Resolved with IV fluids. Na today: 135 -- Continue IV fluids until PO intake improves -- Daily BMP

## 2021-11-30 NOTE — Assessment & Plan Note (Addendum)
Secondary to poor p.o. intake and nausea vomiting, electrolyte derangements. -- Manage underlying issues as outlined -- No PT needed, patient ambulating well independently

## 2021-11-30 NOTE — ED Triage Notes (Signed)
Pt endorses n/v for 6 weeks and ill. Sent by nephro due to low potassium and tachycardia. Pt endorses pain from head to toe.

## 2021-11-30 NOTE — Assessment & Plan Note (Addendum)
Noted in chart review history. No acute issues during admission.

## 2021-11-30 NOTE — Assessment & Plan Note (Addendum)
Secondary C. difficile infection. WBC on admission 18.1, down from 26.8 about a week ago in the ED.   WBC today 11.4 -- Repeat CBC in 1 to 2 weeks
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
## 2021-12-01 DIAGNOSIS — A0472 Enterocolitis due to Clostridium difficile, not specified as recurrent: Secondary | ICD-10-CM | POA: Diagnosis present

## 2021-12-01 DIAGNOSIS — R112 Nausea with vomiting, unspecified: Secondary | ICD-10-CM | POA: Diagnosis not present

## 2021-12-01 LAB — COMPREHENSIVE METABOLIC PANEL
ALT: 7 U/L (ref 0–44)
AST: 11 U/L — ABNORMAL LOW (ref 15–41)
Albumin: 1.6 g/dL — ABNORMAL LOW (ref 3.5–5.0)
Alkaline Phosphatase: 122 U/L (ref 38–126)
Anion gap: 3 — ABNORMAL LOW (ref 5–15)
BUN: <5 mg/dL — ABNORMAL LOW (ref 6–20)
CO2: 24 mmol/L (ref 22–32)
Calcium: 7.3 mg/dL — ABNORMAL LOW (ref 8.9–10.3)
Chloride: 108 mmol/L (ref 98–111)
Creatinine, Ser: 0.95 mg/dL (ref 0.61–1.24)
GFR, Estimated: >60 mL/min (ref >60)
Glucose, Bld: 97 mg/dL (ref 70–99)
Potassium: 2.5 mmol/L — CL (ref 3.5–5.1)
Sodium: 135 mmol/L (ref 135–145)
Total Bilirubin: 0.5 mg/dL (ref 0.3–1.2)
Total Protein: 4.1 g/dL — ABNORMAL LOW (ref 6.5–8.1)

## 2021-12-01 LAB — GASTROINTESTINAL PANEL BY PCR, STOOL (REPLACES STOOL CULTURE)

## 2021-12-01 LAB — POTASSIUM: Potassium: 3.8 mmol/L (ref 3.5–5.1)

## 2021-12-01 LAB — CBC
HCT: 30.2 % — ABNORMAL LOW (ref 39.0–52.0)
Hemoglobin: 10.3 g/dL — ABNORMAL LOW (ref 13.0–17.0)
MCH: 30 pg (ref 26.0–34.0)
MCHC: 34.1 g/dL (ref 30.0–36.0)
MCV: 88 fL (ref 80.0–100.0)
Platelets: 414 10*3/uL — ABNORMAL HIGH (ref 150–400)
RBC: 3.43 MIL/uL — ABNORMAL LOW (ref 4.22–5.81)
RDW: 14.1 % (ref 11.5–15.5)
WBC: 14 10*3/uL — ABNORMAL HIGH (ref 4.0–10.5)
nRBC: 0 % (ref 0.0–0.2)

## 2021-12-01 LAB — C DIFFICILE QUICK SCREEN W PCR REFLEX
C Diff antigen: POSITIVE — AB
C Diff toxin: NEGATIVE

## 2021-12-01 LAB — CLOSTRIDIUM DIFFICILE BY PCR, REFLEXED: Toxigenic C. Difficile by PCR: POSITIVE — AB

## 2021-12-01 LAB — MAGNESIUM: Magnesium: 2.4 mg/dL (ref 1.7–2.4)

## 2021-12-01 LAB — LACTIC ACID, PLASMA: Lactic Acid, Venous: 0.9 mmol/L (ref 0.5–1.9)

## 2021-12-01 LAB — HIV ANTIBODY (ROUTINE TESTING W REFLEX): HIV Screen 4th Generation wRfx: NONREACTIVE

## 2021-12-01 MED ORDER — VANCOMYCIN HCL 125 MG PO CAPS
125.0000 mg | ORAL_CAPSULE | Freq: Four times a day (QID) | ORAL | Status: DC
  Administered 2021-12-01 – 2021-12-02 (×4): 125 mg via ORAL
  Filled 2021-12-01 (×6): qty 1

## 2021-12-01 MED ORDER — THIAMINE HCL 100 MG PO TABS
100.0000 mg | ORAL_TABLET | Freq: Every day | ORAL | Status: DC
  Administered 2021-12-01 – 2021-12-02 (×2): 100 mg via ORAL
  Filled 2021-12-01 (×2): qty 1

## 2021-12-01 MED ORDER — PRAMIPEXOLE DIHYDROCHLORIDE 0.25 MG PO TABS: 0.1250 mg | ORAL_TABLET | Freq: Every evening | ORAL | Status: DC | PRN

## 2021-12-01 MED ORDER — ASPIRIN 81 MG PO TBEC
81.0000 mg | DELAYED_RELEASE_TABLET | Freq: Every day | ORAL | Status: DC
  Administered 2021-12-01 – 2021-12-02 (×2): 81 mg via ORAL
  Filled 2021-12-01 (×2): qty 1

## 2021-12-01 MED ORDER — DULOXETINE HCL 30 MG PO CPEP
90.0000 mg | ORAL_CAPSULE | Freq: Every day | ORAL | Status: DC
  Administered 2021-12-01 – 2021-12-02 (×2): 90 mg via ORAL
  Filled 2021-12-01 (×2): qty 3

## 2021-12-01 MED ORDER — POTASSIUM CHLORIDE 10 MEQ/100ML IV SOLN
10.0000 meq | INTRAVENOUS | Status: AC
  Administered 2021-12-01 (×4): 10 meq via INTRAVENOUS
  Filled 2021-12-01 (×4): qty 100

## 2021-12-01 MED ORDER — ADULT MULTIVITAMIN W/MINERALS CH
1.0000 | ORAL_TABLET | Freq: Every day | ORAL | Status: DC
  Administered 2021-12-01 – 2021-12-02 (×2): 1 via ORAL
  Filled 2021-12-01 (×2): qty 1

## 2021-12-01 MED ORDER — POTASSIUM CHLORIDE CRYS ER 20 MEQ PO TBCR
40.0000 meq | EXTENDED_RELEASE_TABLET | ORAL | Status: AC
Start: 2021-12-01 — End: 2021-12-01
  Administered 2021-12-01 (×2): 40 meq via ORAL
  Filled 2021-12-01 (×2): qty 2

## 2021-12-01 NOTE — Assessment & Plan Note (Signed)
C. difficile toxin and PCR positive on 6/15. --Started oral vancomycin, discharged with 9 more days to complete 10-day course  Diarrhea improved significantly, stools warming and BMs much less frequent.

## 2021-12-01 NOTE — Hospital Course (Signed)
56 y.o. male with medical history significant of hypertension, stable angina, GERD, esophageal stricture and stenosis, gastritis, neuropathy, lumbar DJD, severe PCM, RLS, hyperlipidemia, vitamin D deficiency and failure to thrive presented to the ED on 11/30/2021 after being seen in nephrology clinic where he was found to have significant hypokalemia, hyponatremia and hypomagnesemia in the setting of intractable nausea vomiting for a few weeks.  Also reported diarrhea.  Started on IV fluids and electrolyte replacements.  Stool studies were sent and did return positive for C. difficile on 6/15.  Oral vancomycin was started.  6/15: Nausea vomiting better, advance diet.  Replace magnesium. Continue fluids until p.o. intake improves.
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
## 2021-12-01 NOTE — Progress Notes (Addendum)
Progress Note   Patient: Kevin Alexander SWN:[id number] DOB: 22-Nov-1965 DOA: 11/30/2021     2 DOS: the patient was seen and examined on 12/02/2021   Brief hospital course: 56 y.o. male with medical history significant of hypertension, stable angina, GERD, esophageal stricture and stenosis, gastritis, neuropathy, lumbar DJD, severe PCM, RLS, hyperlipidemia, vitamin D deficiency and failure to thrive presented to the ED on 11/30/2021 after being seen in nephrology clinic where he was found to have significant hypokalemia, hyponatremia and hypomagnesemia in the setting of intractable nausea vomiting for a few weeks.  Also reported diarrhea.  Started on IV fluids and electrolyte replacements.  Stool studies were sent and did return positive for C. difficile on 6/15.  Oral vancomycin was started.  6/15: Nausea vomiting better, advance diet.  Replace magnesium. Continue fluids until p.o. intake improves.  Assessment and Plan: * Intractable nausea and vomiting Etiology unclear.  CT abdomen pelvis on 5/17 was without any acute findings other than some nonspecific bowel wall enhancement that could indicate an enteritis. CT abdomen pelvis on admission 6/15 showed mild diffuse wall, decreased mucosal suggesting possible chronic colitis no pericolic stranding or fluid collections were seen. Stool studies positive for C. difficile, started on oral vancomycin. -- IV Zofran, Compazine as needed -- Monitor and replace electrolytes -- Advance diet to regular --Continue fluids for now, stop later /tmrw  Failure to thrive in adult Appears chronic for quite some time. Body mass index is 20.36 kg/m. -- Dietitian consulted -- Consider appetite stimulant once tolerating p.o.  Gastritis without bleeding History of gastritis.  This could be etiology of his nausea vomiting, however now with C. difficile positive less suspicion for gastritis causing acute illness.  Chronic back pain Secondary to lumbar  DJD. Pain control as needed  Degenerative joint disease (DJD) of lumbar spine Pain control as needed PT and OT evaluations when able to participate  C. difficile colitis C. difficile toxin and PCR positive on 6/15. --Start oral vancomycin  Diarrhea Secondary to C. difficile infection.  With associated nausea vomiting.  Significant leukocytosis of 26.8 when seen in the ED a week ago, down to 18.1 on admission.  6/15 C. difficile returned positive (toxin and PCR). -- Management as outlined  Hypomagnesemia Resolved.  Mg 1.6 on admission, replacement started in the ED.  Due to nausea vomiting. --Repeat Mg with AM labs  Hypokalemia Due to N/V and diarrhea. Potassium less than 2.0 on admission. Replacement started in the ED with K riders. K today 2.5 -- Add'l K riders ordered -- continue LR +20 mEq KCl at 100 cc/h -- Repeat BMP in morning  Hyponatremia with decreased serum osmolality Sodium 130 on admission.  He follows with nephrology for this ongoing issue. Resolved with IV fluids. Na today: 135 -- Continue IV fluids until PO intake improves -- Daily BMP  Leukocytosis Secondary C. difficile infection. WBC on admission 18.1, down from 26.8 about a week ago in the ED.   -- Daily CBC  General weakness Secondary to poor p.o. intake and nausea vomiting, electrolyte derangements. -- Manage underlying issues as outlined -- Fall precautions -- PT and OT   Stricture and stenosis of esophagus Noted in chart review history. Unclear if contributory but sounds unlikely.  GERD (gastroesophageal reflux disease) Takes Prilosec.  Allergy of N/V to Protonix noted. Hold PPI for now.  Resume at d/c.  Hypertension BP currently controlled. Hold home verapamil. IV hydralazine PRN for now.  Hyperlipidemia, mixed Hold Lipitor until tolerance for p.o. intake  improves  Neuropathy Stable  Protein-calorie malnutrition, severe Body mass index is 20.36 kg/m. Albumin 1.9 on admission.    Dietitian consult. Resume multivitamin and thiamine.  Vitamin D deficiency Noted.  Resume vitamin D supplement when  p.o. intake improves        Subjective: Patient seen awake resting in bed today.  He reports having 2 episodes of diarrhea so far this morning.  Reports being very cold and asking for warm blankets.  Denies any nausea vomiting and asking for diet to be advanced today.  No other acute complaints or acute events or  Physical Exam: Vitals:   12/01/21 0852 12/01/21 1550 12/01/21 1954 12/02/21 0430  BP: 111/73 95/69 106/76 106/77  Pulse: (!) 103 80 78 96  Resp: '16 16 18 18  '$ Temp: 98 F (36.7 C) 98.9 F (37.2 C) 98.1 F (36.7 C) 98 F (36.7 C)  TempSrc: Oral  Oral   SpO2: 96% 97% 97% 99%  Weight:      Height:       General exam: awake, alert, no acute distress frail, underweight HEENT: atraumatic, clear conjunctiva, anicteric sclera, moist mucus membranes, hearing grossly normal  Respiratory system: CTAB, no wheezes, rales or rhonchi, normal respiratory effort. Cardiovascular system: normal S1/S2, RRR, no JVD, murmurs, rubs, gallops, no pedal edema.   Gastrointestinal system: soft, NT, ND, hyperactive bowel sounds. Central nervous system: A&O x3. no gross focal neurologic deficits, normal speech Extremities: moves all, no edema, normal tone Skin: dry, intact, normal temperature Psychiatry: normal mood, congruent affect, judgement and insight appear normal   Data Reviewed:  Notable labs: 1.3 with albumin 1.6, anion gap 3, AST 11, WBC improved to 14.0, 0.3 down slightly, platelets 414 downtrending.  C. difficile antigen and PCR positive. GI panel negative.    Family Communication: None  Disposition: Status is: Inpatient Remains inpatient appropriate because: Persistent electrolyte abnormalities requiring replacement and close monitoring, advancing diet.  Anticipate DC in 24 to 48 hours if tolerating p.o. intake and electrolytes are stable   Planned  Discharge Destination: Home    Time spent: 40 minutes  Author: Ezekiel Slocumb, DO 12/02/2021 8:12 AM  For on call review [email or web address].

## 2021-12-02 ENCOUNTER — Other Ambulatory Visit (HOSPITAL_COMMUNITY): Payer: Self-pay

## 2021-12-02 ENCOUNTER — Encounter: Payer: Self-pay | Admitting: Oncology

## 2021-12-02 DIAGNOSIS — R112 Nausea with vomiting, unspecified: Secondary | ICD-10-CM | POA: Diagnosis not present

## 2021-12-02 LAB — CBC
HCT: 29.3 % — ABNORMAL LOW (ref 39.0–52.0)
Hemoglobin: 9.6 g/dL — ABNORMAL LOW (ref 13.0–17.0)
MCH: 30.2 pg (ref 26.0–34.0)
MCHC: 32.8 g/dL (ref 30.0–36.0)
MCV: 92.1 fL (ref 80.0–100.0)
Platelets: 373 10*3/uL (ref 150–400)
RBC: 3.18 MIL/uL — ABNORMAL LOW (ref 4.22–5.81)
RDW: 14.6 % (ref 11.5–15.5)
WBC: 11.4 10*3/uL — ABNORMAL HIGH (ref 4.0–10.5)
nRBC: 0 % (ref 0.0–0.2)

## 2021-12-02 LAB — BASIC METABOLIC PANEL
Anion gap: 3 — ABNORMAL LOW (ref 5–15)
BUN: <5 mg/dL — ABNORMAL LOW (ref 6–20)
CO2: 23 mmol/L (ref 22–32)
Calcium: 7.3 mg/dL — ABNORMAL LOW (ref 8.9–10.3)
Chloride: 110 mmol/L (ref 98–111)
Creatinine, Ser: 0.98 mg/dL (ref 0.61–1.24)
GFR, Estimated: >60 mL/min (ref >60)
Glucose, Bld: 101 mg/dL — ABNORMAL HIGH (ref 70–99)
Potassium: 4.2 mmol/L (ref 3.5–5.1)
Sodium: 136 mmol/L (ref 135–145)

## 2021-12-02 LAB — PHOSPHORUS: Phosphorus: 2.1 mg/dL — ABNORMAL LOW (ref 2.5–4.6)

## 2021-12-02 LAB — MAGNESIUM: Magnesium: 2 mg/dL (ref 1.7–2.4)

## 2021-12-02 MED ORDER — POTASSIUM & SODIUM PHOSPHATES 280-160-250 MG PO PACK
1.0000 | PACK | Freq: Three times a day (TID) | ORAL | Status: DC
  Filled 2021-12-02 (×2): qty 1

## 2021-12-02 MED ORDER — VANCOMYCIN HCL 125 MG PO CAPS: 125.0000 mg | ORAL_CAPSULE | Freq: Four times a day (QID) | ORAL | 0 refills | Status: AC

## 2021-12-02 NOTE — Discharge Summary (Addendum)
Physician Discharge Summary   Patient: Kevin Alexander MRN: [id number] DOB: Oct 30, 1965  Admit date:     11/30/2021  Discharge date: 12/02/2021  Discharge Physician: Ezekiel Slocumb   PCP: Jon Billings, NP   Recommendations at discharge:   Follow up with Primary Care in 1-2 weeks Follow up on BP - Verapamil was held at d/c to avoid hypotension.  BP's were low/normal with it held during admission Repeat BMP, Mg, Phos, CBC in 1-2 weeks  Discharge Diagnoses: Active Problems:   Gastritis without bleeding   Failure to thrive in adult   Degenerative joint disease (DJD) of lumbar spine   Chronic back pain   C. difficile colitis   Leukocytosis   GERD (gastroesophageal reflux disease)   Stricture and stenosis of esophagus   General weakness   Hypertension   Hyperlipidemia, mixed   Neuropathy   Protein-calorie malnutrition, severe   Vitamin D deficiency  Principal Problem (Resolved):   Intractable nausea and vomiting Resolved Problems:   Hyponatremia with decreased serum osmolality   Hypokalemia   Hypomagnesemia   Diarrhea  Hospital Course: 56 y.o. male with medical history significant of hypertension, stable angina, GERD, esophageal stricture and stenosis, gastritis, neuropathy, lumbar DJD, severe PCM, RLS, hyperlipidemia, vitamin D deficiency and failure to thrive presented to the ED on 11/30/2021 after being seen in nephrology clinic where he was found to have significant hypokalemia, hyponatremia and hypomagnesemia in the setting of intractable nausea vomiting for a few weeks.  Also reported diarrhea.  Started on IV fluids and electrolyte replacements.  Stool studies were sent and did return positive for C. difficile on 6/15.  Oral vancomycin was started.  6/15: Nausea vomiting better, advance diet.  Replace magnesium. Continue fluids until p.o. intake improves.  6/16: Nausea vomiting resolved, tolerating diet, reports stools becoming more formed and much less frequent.   Feeling much better and medically stable for discharge home.  Assessment and Plan: * Intractable nausea and vomiting-resolved as of 12/04/2021 Etiology unclear.  CT abdomen pelvis on 5/17 was without any acute findings other than some nonspecific bowel wall enhancement that could indicate an enteritis. CT abdomen pelvis on admission 6/15 showed mild diffuse wall, decreased mucosal suggesting possible chronic colitis no pericolic stranding or fluid collections were seen. Stool studies positive for C. difficile, started on oral vancomycin. -- Treated with IV Zofran, Compazine as needed -- Magnesium and potassium were replaced, sodium normalized with IV hydration -- Tolerating usual diet and off IV fluids  Clinically improved, tolerating oral nutrition and hydration.  Medically stable for discharge to continue antibiotics for C. Difficile.  Failure to thrive in adult Appears chronic for quite some time. Body mass index is 20.36 kg/m. -- Dietitian consulted -- Consider appetite stimulant once tolerating p.o.  Gastritis without bleeding History of gastritis.  This could be etiology of his nausea vomiting, however now with C. difficile positive less suspicion for gastritis causing acute illness.  Chronic back pain Secondary to lumbar DJD. Pain control as needed  Degenerative joint disease (DJD) of lumbar spine Pain control as needed No PT evaluation needed, patient ambulating well independently.  C. difficile colitis C. difficile toxin and PCR positive on 6/15. --Started oral vancomycin, discharged with 9 more days to complete 10-day course  Diarrhea improved significantly, stools warming and BMs much less frequent.  Diarrhea-resolved as of 12/04/2021 Secondary to C. difficile infection.  With associated nausea vomiting.  Significant leukocytosis of 26.8 when seen in the ED a week ago, down to 18.1  on admission.  6/15 C. difficile returned positive (toxin and PCR). -- Management as  outlined  Hypomagnesemia-resolved as of 12/04/2021 Resolved.  Mg 1.6 on admission, replacement started in the ED.  Due to nausea vomiting. -- Repeat Mg level in 1 to 2 weeks  Hypokalemia-resolved as of 12/04/2021 Due to N/V and diarrhea. Potassium less than 2.0 on admission. Replacement started in the ED with K riders.  Potassium normalized with replacement and stable now that nausea vomiting is improved diarrhea improving -- Repeat BMP in about 1 to 2 weeks  Hyponatremia with decreased serum osmolality-resolved as of 12/04/2021 Sodium 130 on admission.  He follows with nephrology for this ongoing issue. Resolved with IV fluids. --Repeat BMP in 1 to 2 weeks  Leukocytosis Secondary C. difficile infection. WBC on admission 18.1, down from 26.8 about a week ago in the ED.   WBC today 11.4 -- Repeat CBC in 1 to 2 weeks  General weakness Secondary to poor p.o. intake and nausea vomiting, electrolyte derangements. -- Manage underlying issues as outlined -- No PT needed, patient ambulating well independently  Stricture and stenosis of esophagus Noted in chart review history. No acute issues during admission.  GERD (gastroesophageal reflux disease) Takes Prilosec.  Allergy of N/V to Protonix noted. Resume Prilosec at discharge.  Hypertension BP currently controlled. Verapamil was held due to nausea vomiting and BPs are controlled and at times soft. Stop verapamil at discharge, monitor BPs and follow-up with primary care closely.  Hyperlipidemia, mixed Lipitor was held due to nausea vomiting, resume at discharge  Neuropathy Stable  Protein-calorie malnutrition, severe Body mass index is 20.36 kg/m. Albumin 1.9 on admission.   Dietitian consult. Resume multivitamin and thiamine.  Vitamin D deficiency Noted.  Resume vitamin D supplement when  p.o. intake improves         Consultants: None Procedures performed: None Disposition: Home Diet recommendation:   Discharge Diet Orders (From admission, onward)     Start     Ordered   12/02/21 0000  Diet - low sodium heart healthy        12/02/21 1014           Regular diet DISCHARGE MEDICATION: Allergies as of 12/02/2021       Reactions   Fluconazole Rash   Gabapentin Other (See Comments)   Passing out   Pantoprazole Nausea And Vomiting   Amlodipine Itching, Rash        Medication List     STOP taking these medications    albuterol (2.5 MG/3ML) 0.083% NEBU 3 mL, albuterol (5 MG/ML) 0.5% NEBU 0.5 mL   benzonatate 100 MG capsule Commonly known as: TESSALON   benzonatate 200 MG capsule Commonly known as: TESSALON   dexamethasone 4 MG tablet Commonly known as: DECADRON   diltiazem 120 MG 24 hr capsule Commonly known as: CARDIZEM CD   lactobacillus acidophilus & bulgar chewable tablet   levocetirizine 5 MG tablet Commonly known as: XYZAL   montelukast 10 MG tablet Commonly known as: SINGULAIR   nitroGLYCERIN 0.4 MG SL tablet Commonly known as: NITROSTAT   potassium chloride 10 MEQ tablet Commonly known as: KLOR-CON   thiamine 100 MG tablet Commonly known as: Vitamin B-1   verapamil 180 MG CR tablet Commonly known as: CALAN-SR       TAKE these medications    aspirin EC 81 MG tablet Take 81 mg by mouth daily. Swallow whole.   atorvastatin 20 MG tablet Commonly known as: LIPITOR Take 1 tablet (20 mg  total) by mouth every morning.   DULoxetine 30 MG capsule Commonly known as: CYMBALTA Take 1 capsule (30 mg total) by mouth every morning. Take with 60 mg to equal 90 mg daily   DULoxetine 60 MG capsule Commonly known as: CYMBALTA Take 1 capsule (60 mg total) by mouth daily.   multivitamin tablet Take 1 tablet by mouth daily.   omeprazole 40 MG capsule Commonly known as: PRILOSEC Take 1 capsule (40 mg total) by mouth every morning.   ondansetron 4 MG disintegrating tablet Commonly known as: ZOFRAN-ODT Take 1 tablet (4 mg total) by mouth every 8  (eight) hours as needed. What changed: Another medication with the same name was removed. Continue taking this medication, and follow the directions you see here.   pramipexole 0.125 MG tablet Commonly known as: Mirapex Take 1 tablet (0.125 mg total) by mouth at bedtime as needed.   SLOW RELEASE IRON PO Take 1 tablet by mouth daily.   thiamine 100 MG tablet Take 100 mg by mouth daily.   vancomycin 125 MG capsule Commonly known as: VANCOCIN Take 1 capsule (125 mg total) by mouth 4 (four) times daily for 9 days.   Vitamin D (Ergocalciferol) 1.25 MG (50000 UNIT) Caps capsule Commonly known as: DRISDOL Take 1 capsule (50,000 Units total) by mouth every Thursday.        Discharge Exam: Filed Weights   11/30/21 1516  Weight: 59 kg   General exam: awake, alert, no acute distress, frail and chronically ill-appearing HEENT: moist mucus membranes, hearing grossly normal  Respiratory system: On room air, normal respiratory effort. Cardiovascular system: Regular rate and rhythm, no peripheral edema.   Gastrointestinal system: Soft nontender nondistended, bowel sounds present3. Central nervous system: A&O x3. no gross focal neurologic deficits, normal speech Extremities: moves all, no edema, normal tone Skin: dry, intact, normal temperature, normal color, No rashes, lesions or ulcers Psychiatry: normal mood, congruent affect, judgement and insight appear normal   Condition at discharge: stable  The results of significant diagnostics from this hospitalization (including imaging, microbiology, ancillary and laboratory) are listed below for reference.   Imaging Studies: CT ABDOMEN PELVIS WO CONTRAST  Result Date: 11/30/2021 CLINICAL DATA:  Nausea and vomiting EXAM: CT ABDOMEN AND PELVIS WITHOUT CONTRAST TECHNIQUE: Multidetector CT imaging of the abdomen and pelvis was performed following the standard protocol without IV contrast. RADIATION DOSE REDUCTION: This exam was performed  according to the departmental dose-optimization program which includes automated exposure control, adjustment of the mA and/or kV according to patient size and/or use of iterative reconstruction technique. COMPARISON:  11/02/2021 FINDINGS: Lower chest: There are faint ground-glass densities in the right parahilar region. There is no focal consolidation in the lower lung fields. There is ectasia of ascending thoracic aorta measuring 3.5 cm. There are scattered coronary artery calcifications. Hepatobiliary: There is no dilation of bile ducts. Small coarse calcifications are seen in the liver. Gallbladder is not distended. Pancreas: No focal abnormality is seen. Spleen: Unremarkable. Adrenals/Urinary Tract: Adrenals are not enlarged. There is no hydronephrosis. There are few tiny faint calcific densities in both kidneys which may suggest tiny nonobstructing renal stones or partial volume averaging artifacts. There is 9 mm fluid density lesion in the midportion of right kidney suggests possible renal cyst. Ureters are unremarkable. Urinary bladder is not distended. Evaluation of bladder is limited by severe beam hardening artifacts. Stomach/Bowel: Small hiatal hernia is seen. There is contrast in the lumen of lower thoracic esophagus, possibly oral medication being ingested or reflux of  oral medication from the stomach into lower thoracic esophagus. Small bowel loops are not dilated. Appendix is not dilated. There is mild diffuse wall thickening in colon. There is low-attenuation in the submucosal region in the colon. These findings suggest chronic nonspecific colitis. There is no pericolic stranding or fluid collection. Evaluation of sigmoid is limited by severe beam hardening artifacts. Vascular/Lymphatic: Unremarkable. Reproductive: Unremarkable. Other: There is no ascites or pneumoperitoneum. Umbilical hernia containing fat is seen. Musculoskeletal: Pain control lead is noted in the spinal canal at thoracolumbar  junction. There is previous left hip arthroplasty. IMPRESSION: There is no evidence of intestinal obstruction or pneumoperitoneum. Appendix is not dilated. There is no hydronephrosis. There is mild diffuse wall thickening in colon along with decreased density in the submucosal region suggesting possible chronic colitis. There is no pericolic stranding or fluid collection. Small hiatal hernia.  Possible gastroesophageal reflux. There are faint ground-glass densities in the right mid lung fields suggesting scarring or mild interstitial pneumonia. There is 9 mm fluid density lesion in the midportion of right kidney suggesting renal cyst. No further follow-up is needed for this finding. There are possible tiny 1 mm nonobstructing renal stones. Other findings as described in the body of the report. Electronically Signed   By: Elmer Picker M.D.   On: 11/30/2021 18:33    Microbiology: Results for orders placed or performed during the hospital encounter of 11/30/21  C Difficile Quick Screen w PCR reflex     Status: Abnormal   Collection Time: 12/01/21  8:31 AM   Specimen: STOOL  Result Value Ref Range Status   C Diff antigen POSITIVE (A) NEGATIVE Final   C Diff toxin NEGATIVE NEGATIVE Final   C Diff interpretation Results are indeterminate. See PCR results.  Final    Comment: Performed at [facility], [address] [place], [place] [address]  Gastrointestinal Panel by PCR , Stool     Status: None   Collection Time: 12/01/21  8:31 AM   Specimen: STOOL  Result Value Ref Range Status   Campylobacter species NOT DETECTED NOT DETECTED Final   Plesimonas shigelloides NOT DETECTED NOT DETECTED Final   Salmonella species NOT DETECTED NOT DETECTED Final   Yersinia enterocolitica NOT DETECTED NOT DETECTED Final   Vibrio species NOT DETECTED NOT DETECTED Final   Vibrio cholerae NOT DETECTED NOT DETECTED Final   Enteroaggregative E coli (EAEC) NOT DETECTED NOT DETECTED Final   Enteropathogenic  E coli (EPEC) NOT DETECTED NOT DETECTED Final   Enterotoxigenic E coli (ETEC) NOT DETECTED NOT DETECTED Final   Shiga like toxin producing E coli (STEC) NOT DETECTED NOT DETECTED Final   Shigella/Enteroinvasive E coli (EIEC) NOT DETECTED NOT DETECTED Final   Cryptosporidium NOT DETECTED NOT DETECTED Final   Cyclospora cayetanensis NOT DETECTED NOT DETECTED Final   Entamoeba histolytica NOT DETECTED NOT DETECTED Final   Giardia lamblia NOT DETECTED NOT DETECTED Final   Adenovirus F40/41 NOT DETECTED NOT DETECTED Final   Astrovirus NOT DETECTED NOT DETECTED Final   Norovirus GI/GII NOT DETECTED NOT DETECTED Final   Rotavirus A NOT DETECTED NOT DETECTED Final   Sapovirus (I, II, IV, and V) NOT DETECTED NOT DETECTED Final    Comment: Performed at [facility], [address] [place], [place] [address]  C. Diff by PCR, Reflexed     Status: Abnormal   Collection Time: 12/01/21  8:31 AM  Result Value Ref Range Status   Toxigenic C. Difficile by PCR POSITIVE (A) NEGATIVE Final  Comment: Positive for toxigenic C. difficile with little to no toxin production. Only treat if clinical presentation suggests symptomatic illness. Performed at [facility], [address] [place], [place] [address]     Labs: CBC: Recent Labs  Lab 11/30/21 1623 11/30/21 1943 12/01/21 0503 12/02/21 0533  WBC 18.1* 18.6* 14.0* 11.4*  NEUTROABS 14.9*  --   --   --   HGB 12.5* 11.2* 10.3* 9.6*  HCT 37.2* 33.3* 30.2* 29.3*  MCV 88.2 88.3 88.0 92.1  PLT 456* 454* 414* 111   Basic Metabolic Panel: Recent Labs  Lab 11/30/21 1623 12/01/21 0503 12/01/21 1425 12/02/21 0533  NA 130* 135  --  136  K <2.0* 2.5* 3.8 4.2  CL 99 108  --  110  CO2 23 24  --  23  GLUCOSE 159* 97  --  101*  BUN <5* <5*  --  <5*  CREATININE 1.07 0.95  --  0.98  CALCIUM 7.6* 7.3*  --  7.3*  MG 1.6* 2.4  --  2.0  PHOS  --   --   --  2.1*   Liver Function Tests: Recent Labs  Lab 11/30/21 1623 12/01/21 0503   AST 16 11*  ALT 8 7  ALKPHOS 144* 122  BILITOT 0.7 0.5  PROT 4.9* 4.1*  ALBUMIN 1.9* 1.6*   CBG: No results for input(s): "GLUCAP" in the last 168 hours.  Discharge time spent: less than 30 minutes.  Signed: Ezekiel Slocumb, DO [facility] 12/04/2021

## 2021-12-02 NOTE — TOC Benefit Eligibility Note (Signed)
Patient Teacher, English as a foreign language completed.    The patient is currently admitted and upon discharge could be taking vancomycin 125 mg capsules.  The current 10 day co-pay is, $126.26 due to a $150.00 deductible.   The patient is insured through [place], [place] Patient Advocate Specialist [place] Patient Advocate Team Direct Number: [phone]  Fax: [phone]

## 2021-12-04 ENCOUNTER — Encounter: Payer: Self-pay | Admitting: Internal Medicine

## 2021-12-06 ENCOUNTER — Ambulatory Visit: Payer: Self-pay | Admitting: *Deleted

## 2021-12-06 NOTE — Telephone Encounter (Signed)
FYI for appointment tomorrow.  

## 2021-12-06 NOTE — Telephone Encounter (Signed)
Summary: Allergic Reaction to Vancomyacin   The patient was seen in the hospital for vomiting and diarrhea and dehydration. He was given a 9 day prescription for vancomycin which he is having an allergic reaction to. He states he is itching all over and just uncomfortable. He has used benadryl but that has not helped. He is requesting to get a different medication. Please assist patient further        Chief Complaint: allergic reaction to vancomycin Symptoms: itching, hives all over.  Frequency: itching starting in hospital on 6/14-6/16/23. Stopped vancomycin 2 days ago and rash/ hives started. Benadryl not effective Pertinent Negatives: Patient denies chest pain no difficulty breathing, no fever, no facial swelling no tongue or swelling reported.  Disposition: '[]'$ ED /'[]'$ Urgent Care (no appt availability in office) / '[x]'$ Appointment(In office/virtual)/ '[]'$  [facility]/ '[]'$ Home Care/ '[]'$ Refused Recommended Disposition /'[]'$  Mobile Bus/ '[]'$  Follow-up with PCP Additional Notes:   Requesting to allow Bonnielee Haff to have permission to schedule patient appt or review information for patient . Recommended at tomorrow's visit sign for DPR. Patient requesting if another antibiotic needed.     Reason for Disposition  [1] MODERATE-SEVERE hives persist (i.e., hives interfere with normal activities or work) AND [2] taking antihistamine (e.g., Benadryl, Claritin) > 24 hours  Answer Assessment - Initial Assessment Questions 1. APPEARANCE: "What does the rash look like?"      Pinkish hives "all over"  2. LOCATION: "Where is the rash located?"      Arms , top of scalp, and "all over" 3. NUMBER: "How many hives are there?"      na 4. SIZE: "How big are the hives?" (inches, cm, compare to coins) "Do they all look the same or is there lots of variation in shape and size?"      Na  5. ONSET: "When did the hives begin?" (Hours or days ago)      Started itching while in hospital last week wed-  fri 11/30/21-12/02/21 6. ITCHING: "Does it itch?" If Yes, ask: "How bad is the itch?"    - MILD: doesn't interfere with normal activities   - MODERATE-SEVERE: interferes with work, school, sleep, or other activities      Moderate to severe 7. RECURRENT PROBLEM: "Have you had hives before?" If Yes, ask: "When was the last time?" and "What happened that time?"      Na  8. TRIGGERS: "Were you exposed to any new food, plant, cosmetic product or animal just before the hives began?"     na 9. OTHER SYMPTOMS: "Do you have any other symptoms?" (e.g., fever, tongue swelling, difficulty breathing, abdominal pain)     Itching  10. PREGNANCY: "Is there any chance you are pregnant?" "When was your last menstrual period?"       na  Protocols used: Hives-A-AH

## 2021-12-07 ENCOUNTER — Ambulatory Visit: Payer: Medicare Other | Admitting: Internal Medicine

## 2021-12-08 ENCOUNTER — Ambulatory Visit: Payer: Medicare Other | Admitting: Internal Medicine

## 2021-12-08 ENCOUNTER — Ambulatory Visit: Payer: Self-pay | Admitting: *Deleted

## 2021-12-08 NOTE — Telephone Encounter (Signed)
Patient was scheduled to see Dr. Neomia Dear today at 3:20, but was cancelled as stated below. Patient rescheduled appointment for Monday. 3:20 appt slot has been taken since then.   Please call patient and see if he would like to do his visit virtually tomorrow afternoon with Junie Panning.

## 2021-12-08 NOTE — Telephone Encounter (Signed)
Called patient and he stated he has an appointment with his heart doctor tomorrow so he wont be able to do a virtual appointment. Patient wants to keep appointment scheduled for Monday with Erin.

## 2021-12-08 NOTE — Telephone Encounter (Signed)
Summary: nausea, diarrhea   Pt called in to reschedule his appointment scheduled for an allergic reaction to vancomycin.  Pt stated he is experiencing nausea and diarrhea. He stated he was given medication that slowed it down, but he cannot make it to the office without messing up his seats.   Pt seeking clinical advice. Rescheduled for Monday per request.      Chief Complaint:   cancelled appt due to diarrhea  Symptoms: itching from  hives better. Not taking vancomycin x 1-2 days now. Continues with diarrhea and has had approx. 6 episodes within 24 hours and 3 today. Has been seen in ED and can not tolerate vancomycin given.  Frequency: today  Pertinent Negatives: Patient denies denies dizziness , weakness, passing out or no heart palpitations  Disposition: '[]'$ ED /'[]'$ Urgent Care (no appt availability in office) / '[]'$ Appointment(In office/virtual)/ '[]'$  [facility]/ '[]'$ Home Care/ '[x]'$ Refused Recommended Disposition /'[]'$ [facility]/ '[]'$  Follow-up with PCP Additional Notes:   Please advise if patient still able to do telephone appt today at 3:20 pm. Patient cancelled appt due to diarrhea and rescheduled for 12/12/21. Please advise patient if ok to do telephone appt.    Reason for Disposition  MODERATE diarrhea (e.g., 4-6 times / day more than normal)  Answer Assessment - Initial Assessment Questions 1. ANTIBIOTIC: "What antibiotic are you taking?" "How many times per day?"     Was taking vancomycin  2. ANTIBIOTIC ONSET: "When was the antibiotic started?"     Vancomycin  3. DIARRHEA SEVERITY: "How bad is the diarrhea?" "How many more stools have you had in the past 24 hours than normal?"    - NO DIARRHEA (SCALE 0)   - MILD (SCALE 1-3): Few loose or mushy BMs; increase of 1-3 stools over normal daily number of stools; mild increase in ostomy output.   -  MODERATE (SCALE 4-7): Increase of 4-6 stools daily over normal; moderate increase in ostomy output. * SEVERE (SCALE 8-10; OR  'WORST POSSIBLE'): Increase of 7 or more stools daily over normal; moderate increase in ostomy output; incontinence.     Moderate  4. ONSET: "When did the diarrhea begin?"      After taking vancomycin and discharge from hospital see triage from 12/07/21 5. BM CONSISTENCY: "How loose or watery is the diarrhea?"      Na  6. VOMITING: "Are you also vomiting?" If Yes, ask: "How many times in the past 24 hours?"      na 7. ABDOMINAL PAIN: "Are you having any abdominal pain?" If Yes, ask: "What does it feel like?" (e.g., crampy, dull, intermittent, constant)      na 8. ABDOMINAL PAIN SEVERITY: If present, ask: "How bad is the pain?"  (e.g., Scale 1-10; mild, moderate, or severe)   - MILD (1-3): doesn't interfere with normal activities, abdomen soft and not tender to touch    - MODERATE (4-7): interferes with normal activities or awakens from sleep, abdomen tender to touch    - SEVERE (8-10): excruciating pain, doubled over, unable to do any normal activities       na 9. ORAL INTAKE: If vomiting, "Have you been able to drink liquids?" "How much liquids have you had in the past 24 hours?"     Yes gatorade  10. HYDRATION: "Any signs of dehydration?" (e.g., dry mouth [not just dry lips], too weak to stand, dizziness, new weight loss) "When did you last urinate?"       No  11. EXPOSURE: "Have  you traveled to a foreign country recently?" "Have you been exposed to anyone with diarrhea?" "Could you have eaten any food that was spoiled?"       na 12. OTHER SYMPTOMS: "Do you have any other symptoms?" (e.g., fever, blood in stool)       na 13. PREGNANCY: "Is there any chance you are pregnant?" "When was your last menstrual period?"       na  Protocols used: Diarrhea on Antibiotics-A-AH

## 2021-12-09 ENCOUNTER — Ambulatory Visit: Payer: Medicare Other | Admitting: Cardiology

## 2021-12-12 ENCOUNTER — Ambulatory Visit: Payer: Medicare Other | Admitting: Physician Assistant

## 2021-12-19 ENCOUNTER — Ambulatory Visit: Payer: Self-pay

## 2021-12-19 ENCOUNTER — Telehealth: Payer: Self-pay | Admitting: Nurse Practitioner

## 2021-12-19 ENCOUNTER — Encounter: Payer: Self-pay | Admitting: Physician Assistant

## 2021-12-19 ENCOUNTER — Ambulatory Visit (INDEPENDENT_AMBULATORY_CARE_PROVIDER_SITE_OTHER): Payer: Medicare Other | Admitting: Physician Assistant

## 2021-12-19 VITALS — BP 100/71 | HR 123 | Temp 98.6°F

## 2021-12-19 DIAGNOSIS — R112 Nausea with vomiting, unspecified: Secondary | ICD-10-CM

## 2021-12-19 DIAGNOSIS — A0472 Enterocolitis due to Clostridium difficile, not specified as recurrent: Secondary | ICD-10-CM

## 2021-12-19 MED ORDER — METRONIDAZOLE 500 MG PO TABS: 500.0000 mg | ORAL_TABLET | Freq: Three times a day (TID) | ORAL | 0 refills | Status: AC

## 2021-12-19 MED ORDER — ONDANSETRON 4 MG PO TBDP: 4.0000 mg | ORAL_TABLET | Freq: Three times a day (TID) | ORAL | 0 refills | Status: AC | PRN

## 2021-12-19 MED ORDER — DIFICID 200 MG PO TABS: 200.0000 mg | ORAL_TABLET | Freq: Two times a day (BID) | ORAL | 0 refills | Status: DC

## 2021-12-19 NOTE — Telephone Encounter (Signed)
Copied from [facility] [id number]. Topic: General - Other >> Dec 19, 2021  3:43 PM Everette C wrote: Reason for CRM: The patient would like to speak with a member of clinical staff about their prescriptions   The patient would like to know if they're are any medications that they should discontinue taking after their recent visit to the practice  Please contact further when possible

## 2021-12-19 NOTE — Telephone Encounter (Signed)
Unfortunately since he had allergic reaction to vancomycin, there is not another medication I can send through. He may need a PA on his medicine, but that will need to wait until Wednesday

## 2021-12-19 NOTE — Telephone Encounter (Signed)
Dr. Park Liter called and advised of the below, she asked this encounter to be routed to her and she will handle it when she's home.   Summary: pt needs antibodic/ can not afford / pls offer alternative   fidaxomicin (DIFICID) 200 MG TABS tablet 20 tablet 0 12/19/2021  Sig - Route: Take 1 tablet (200 mg total) by mouth 2 (two) times daily. - Oral   Pt states that this was called in today and it is over $700.00 for this antibiotic and he cannot afford and needs something else. Did not PU FU [phone]/ Phone going dead, leave message if can not pu

## 2021-12-19 NOTE — Addendum Note (Signed)
Addended by: Valerie Roys on: 12/19/2021 05:46 PM   Modules accepted: Orders

## 2021-12-19 NOTE — Telephone Encounter (Signed)
Attempted to call pt regarding medication questions. Pt did not answer. [place].   OK for Nurse/PEC to get information if pt calls back.

## 2021-12-19 NOTE — Assessment & Plan Note (Addendum)
Acute, newly diagnosed problem Reports he tried to take Vancomycin as prescribed from hospitalization. He states he developed redness, hives and itching all over skin after taking the first four doses as instructed and stopped taking it Would have preferred to run CBC, CMP but patient did not seem amenable to prolonged time in the clinic. Attempted to discuss alternative treatment options with patient but he was not amenable to this- repeatedly asked to leave or hurry visit along as I tried to explain to him that I needed to evaluate him and discuss next options Will provide Dificid 200 mg PO BID x 10 days  Recommend that pt schedule further follow up with GI specialty in case he has recurrence of c.dif or if DIficid tx is not successful.  Follow up with our office as needed.

## 2021-12-19 NOTE — Progress Notes (Signed)
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     Status: Abnormal   Collection Time: 12/02/21  5:33 AM  Result Value Ref Range   Sodium 136 135 - 145 mmol/L   Potassium 4.2 3.5 - 5.1 mmol/L   Chloride 110 98 - 111 mmol/L   CO2 23 22 - 32 mmol/L   Glucose, Bld 101 (H) 70 - 99 mg/dL    Comment: Glucose reference range applies only to samples  taken after fasting for at least 8 hours.   BUN <5 (L) 6 - 20 mg/dL   Creatinine, Ser 0.98 0.61 - 1.24 mg/dL   Calcium 7.3 (L) 8.9 - 10.3 mg/dL   GFR, Estimated >60 >60 mL/min    Comment: (NOTE) Calculated using the CKD-EPI Creatinine Equation (2021)    Anion gap 3 (L) 5 - 15    Comment: Performed at [facility], [address] [place], [place] [address]  Magnesium     Status: None   Collection Time: 12/02/21  5:33 AM  Result Value Ref Range   Magnesium 2.0 1.7 - 2.4 mg/dL    Comment: Performed at [facility], [address], [place], [place] [address]  Phosphorus     Status: Abnormal   Collection Time: 12/02/21  5:33 AM  Result Value Ref Range   Phosphorus 2.1 (L) 2.5 - 4.6 mg/dL    Comment: Performed at [facility] [address], [place] [place],  [address]     PHQ2/9:    09/15/2021    2:02 PM 08/31/2021   11:58 AM 08/04/2021    3:01 PM 07/04/2021    4:09 PM 05/26/2021    1:43 PM  Depression screen PHQ 2/9  Decreased Interest '2 1 2 2 '$ 0  Down, Depressed, Hopeless '2 1 1 1 '$ 0  PHQ - 2 Score '4 2 3 3 '$ 0  Altered sleeping '2 3 2 3   '$ Tired, decreased energy '2 3 2 3   '$ Change in appetite 2 0 1 1   Feeling bad or failure about yourself  '2 1 1 1   '$ Trouble concentrating '2 1 1 1   '$ Moving slowly or fidgety/restless 2 0 0 1   Suicidal thoughts 0 0 1 1   PHQ-9 Score '16 10 11 14   '$ Difficult doing work/chores Not difficult at all Somewhat difficult Somewhat difficult Somewhat difficult       Fall Risk:    09/15/2021    2:01 PM 08/31/2021   11:56 AM 08/04/2021    3:00 PM 07/04/2021    4:09 PM 05/26/2021    1:43 PM  [place] in the past year? 1 1 1  1 0  Number falls in past yr: '1 1 1 '$ 0   Injury with Fall? 1 0 0 0   Risk for fall due to : History of fall(s) Impaired balance/gait;History of fall(s) Impaired balance/gait Impaired balance/gait   Follow up Falls evaluation completed  Falls evaluation completed Falls evaluation completed        Functional Status Survey:      Assessment & Plan  Problem List Items Addressed This Visit       Digestive   Nausea and vomiting   Relevant Medications   ondansetron (ZOFRAN-ODT) 4 MG disintegrating tablet   C. difficile colitis - Primary    Acute, newly diagnosed problem Reports he tried to take Vancomycin as prescribed from hospitalization. He states he developed redness, hives and itching all over skin after taking the first four doses as instructed and stopped taking it Would have preferred to run CBC, CMP but patient did not seem amenable to prolonged time in the clinic. Attempted to discuss alternative treatment options with patient but he was not amenable to this- repeatedly asked to leave or hurry visit along as I tried to explain to him that I needed to evaluate him and discuss next options Will provide Dificid 200 mg PO BID x 10 days  Recommend that pt schedule further follow up with GI specialty in case he has recurrence of c.dif or if DIficid tx is not successful.  Follow up with our office as needed.        Relevant Medications   fidaxomicin (DIFICID) 200 MG TABS tablet   Other Relevant Orders   Ambulatory referral to Gastroenterology     No follow-ups on file.   I, Larz Mark E Verena Shawgo, PA-C, have reviewed all documentation for this visit. The documentation on 12/19/21 for the exam, diagnosis, procedures, and orders are all accurate and complete.   Talitha Givens, MHS, PA-C [facility]

## 2021-12-19 NOTE — Telephone Encounter (Signed)
Called while on call as below. I will send through flagyl as it's newly diagnosed. May need PA on difoxamicin. FYI.

## 2021-12-21 NOTE — Telephone Encounter (Signed)
Called and LVM letting patient know that Flagyl was sent in for him to take instead.

## 2021-12-29 ENCOUNTER — Ambulatory Visit: Payer: Self-pay

## 2021-12-29 NOTE — Telephone Encounter (Signed)
  Chief Complaint: diarrhea on abx Symptoms: severe diarrhea, vomiting and nausea, feeling cold and sweaty, weakness Frequency: prior to 7/3, feeling worse than before  Pertinent Negatives: Patient denies fevers Disposition: '[x]'$ ED /'[]'$ Urgent Care (no appt availability in office) / '[]'$ Appointment(In office/virtual)/ '[]'$  [facility]/ '[]'$ Home Care/ '[]'$ Refused Recommended Disposition /'[]'$ [facility]/ '[]'$  Follow-up with PCP Additional Notes: pt states he feels worse than when he started abx and wanted to know if any thing different could be called in. I advised pt based on sx reported, going to ED would be best at this point for possible dehydration. Pt verbalized understanding and said if he goes it will be near midnight when they aren't as busy. I educated pt on importance of going based on sx. Advised that I would ask if anything else to help him as well. Pt said he is unable to come in d/t feeling so bad.   Reason for Disposition  Patient sounds very sick or weak to the triager  Answer Assessment - Initial Assessment Questions 1. ANTIBIOTIC: "What antibiotic are you taking?" "How many times per day?"     Flagyl TID x 10 days 2. ANTIBIOTIC ONSET: "When was the antibiotic started?"     12/19/21 3. DIARRHEA SEVERITY: "How bad is the diarrhea?" "How many more stools have you had in the past 24 hours than normal?"    - NO DIARRHEA (SCALE 0)   - MILD (SCALE 1-3): Few loose or mushy BMs; increase of 1-3 stools over normal daily number of stools; mild increase in ostomy output.   -  MODERATE (SCALE 4-7): Increase of 4-6 stools daily over normal; moderate increase in ostomy output. * SEVERE (SCALE 8-10; OR 'WORST POSSIBLE'): Increase of 7 or more stools daily over normal; moderate increase in ostomy output; incontinence.     8 4. ONSET: "When did the diarrhea begin?"      Ongoing prior to 7/3 5. BM CONSISTENCY: "How loose or watery is the diarrhea?"      Loose and watery 6. VOMITING:  "Are you also vomiting?" If Yes, ask: "How many times in the past 24 hours?"      Yes, 8 9. ORAL INTAKE: If vomiting, "Have you been able to drink liquids?" "How much liquids have you had in the past 24 hours?"     Not alot 10. HYDRATION: "Any signs of dehydration?" (e.g., dry mouth [not just dry lips], too weak to stand, dizziness, new weight loss) "When did you last urinate?"       Sweating at times and feeling weak 12. OTHER SYMPTOMS: "Do you have any other symptoms?" (e.g., fever, blood in stool)       Nausea, feeling cold, sneezing  Protocols used: Diarrhea on Antibiotics-A-AH

## 2022-01-31 ENCOUNTER — Encounter: Payer: Self-pay | Admitting: Emergency Medicine

## 2022-01-31 ENCOUNTER — Emergency Department: Payer: Medicare Other

## 2022-01-31 ENCOUNTER — Other Ambulatory Visit: Payer: Self-pay

## 2022-01-31 ENCOUNTER — Inpatient Hospital Stay
Admission: EM | Admit: 2022-01-31 | Discharge: 2022-02-17 | DRG: 871 | Disposition: E | Payer: Medicare Other | Attending: Internal Medicine | Admitting: Internal Medicine

## 2022-01-31 DIAGNOSIS — R6521 Severe sepsis with septic shock: Secondary | ICD-10-CM | POA: Diagnosis present

## 2022-01-31 DIAGNOSIS — E785 Hyperlipidemia, unspecified: Secondary | ICD-10-CM | POA: Diagnosis present

## 2022-01-31 DIAGNOSIS — E8809 Other disorders of plasma-protein metabolism, not elsewhere classified: Secondary | ICD-10-CM | POA: Diagnosis present

## 2022-01-31 DIAGNOSIS — D472 Monoclonal gammopathy: Secondary | ICD-10-CM | POA: Diagnosis present

## 2022-01-31 DIAGNOSIS — F17210 Nicotine dependence, cigarettes, uncomplicated: Secondary | ICD-10-CM | POA: Diagnosis present

## 2022-01-31 DIAGNOSIS — J189 Pneumonia, unspecified organism: Secondary | ICD-10-CM | POA: Diagnosis present

## 2022-01-31 DIAGNOSIS — G8929 Other chronic pain: Secondary | ICD-10-CM | POA: Diagnosis present

## 2022-01-31 DIAGNOSIS — R159 Full incontinence of feces: Secondary | ICD-10-CM | POA: Diagnosis present

## 2022-01-31 DIAGNOSIS — J441 Chronic obstructive pulmonary disease with (acute) exacerbation: Secondary | ICD-10-CM | POA: Diagnosis not present

## 2022-01-31 DIAGNOSIS — Z87442 Personal history of urinary calculi: Secondary | ICD-10-CM

## 2022-01-31 DIAGNOSIS — E43 Unspecified severe protein-calorie malnutrition: Secondary | ICD-10-CM | POA: Diagnosis present

## 2022-01-31 DIAGNOSIS — Z8249 Family history of ischemic heart disease and other diseases of the circulatory system: Secondary | ICD-10-CM

## 2022-01-31 DIAGNOSIS — Z515 Encounter for palliative care: Secondary | ICD-10-CM | POA: Diagnosis not present

## 2022-01-31 DIAGNOSIS — Z7982 Long term (current) use of aspirin: Secondary | ICD-10-CM

## 2022-01-31 DIAGNOSIS — G2581 Restless legs syndrome: Secondary | ICD-10-CM | POA: Diagnosis present

## 2022-01-31 DIAGNOSIS — K219 Gastro-esophageal reflux disease without esophagitis: Secondary | ICD-10-CM | POA: Diagnosis present

## 2022-01-31 DIAGNOSIS — Z79899 Other long term (current) drug therapy: Secondary | ICD-10-CM

## 2022-01-31 DIAGNOSIS — E860 Dehydration: Secondary | ICD-10-CM | POA: Diagnosis present

## 2022-01-31 DIAGNOSIS — I110 Hypertensive heart disease with heart failure: Secondary | ICD-10-CM | POA: Diagnosis present

## 2022-01-31 DIAGNOSIS — Z96642 Presence of left artificial hip joint: Secondary | ICD-10-CM | POA: Diagnosis present

## 2022-01-31 DIAGNOSIS — Z8711 Personal history of peptic ulcer disease: Secondary | ICD-10-CM

## 2022-01-31 DIAGNOSIS — F32A Depression, unspecified: Secondary | ICD-10-CM | POA: Diagnosis present

## 2022-01-31 DIAGNOSIS — A419 Sepsis, unspecified organism: Secondary | ICD-10-CM | POA: Diagnosis present

## 2022-01-31 DIAGNOSIS — M549 Dorsalgia, unspecified: Secondary | ICD-10-CM | POA: Diagnosis present

## 2022-01-31 DIAGNOSIS — I959 Hypotension, unspecified: Secondary | ICD-10-CM | POA: Diagnosis present

## 2022-01-31 DIAGNOSIS — Z9682 Presence of neurostimulator: Secondary | ICD-10-CM

## 2022-01-31 DIAGNOSIS — I2510 Atherosclerotic heart disease of native coronary artery without angina pectoris: Secondary | ICD-10-CM | POA: Diagnosis present

## 2022-01-31 DIAGNOSIS — D509 Iron deficiency anemia, unspecified: Secondary | ICD-10-CM | POA: Diagnosis present

## 2022-01-31 DIAGNOSIS — K2970 Gastritis, unspecified, without bleeding: Secondary | ICD-10-CM | POA: Diagnosis present

## 2022-01-31 DIAGNOSIS — J9600 Acute respiratory failure, unspecified whether with hypoxia or hypercapnia: Secondary | ICD-10-CM | POA: Diagnosis not present

## 2022-01-31 DIAGNOSIS — J440 Chronic obstructive pulmonary disease with acute lower respiratory infection: Secondary | ICD-10-CM | POA: Diagnosis present

## 2022-01-31 DIAGNOSIS — Z8614 Personal history of Methicillin resistant Staphylococcus aureus infection: Secondary | ICD-10-CM

## 2022-01-31 DIAGNOSIS — A0471 Enterocolitis due to Clostridium difficile, recurrent: Secondary | ICD-10-CM | POA: Diagnosis present

## 2022-01-31 DIAGNOSIS — R627 Adult failure to thrive: Secondary | ICD-10-CM | POA: Diagnosis present

## 2022-01-31 DIAGNOSIS — E876 Hypokalemia: Secondary | ICD-10-CM | POA: Diagnosis present

## 2022-01-31 DIAGNOSIS — Z8261 Family history of arthritis: Secondary | ICD-10-CM

## 2022-01-31 DIAGNOSIS — Z20822 Contact with and (suspected) exposure to covid-19: Secondary | ICD-10-CM | POA: Diagnosis present

## 2022-01-31 DIAGNOSIS — G608 Other hereditary and idiopathic neuropathies: Secondary | ICD-10-CM | POA: Diagnosis present

## 2022-01-31 DIAGNOSIS — Q231 Congenital insufficiency of aortic valve: Secondary | ICD-10-CM | POA: Diagnosis not present

## 2022-01-31 DIAGNOSIS — Z66 Do not resuscitate: Secondary | ICD-10-CM | POA: Diagnosis present

## 2022-01-31 DIAGNOSIS — R42 Dizziness and giddiness: Secondary | ICD-10-CM | POA: Diagnosis present

## 2022-01-31 DIAGNOSIS — Z6820 Body mass index (BMI) 20.0-20.9, adult: Secondary | ICD-10-CM

## 2022-01-31 DIAGNOSIS — Z823 Family history of stroke: Secondary | ICD-10-CM

## 2022-01-31 DIAGNOSIS — Z888 Allergy status to other drugs, medicaments and biological substances status: Secondary | ICD-10-CM

## 2022-01-31 DIAGNOSIS — I5032 Chronic diastolic (congestive) heart failure: Secondary | ICD-10-CM | POA: Diagnosis present

## 2022-01-31 DIAGNOSIS — Z801 Family history of malignant neoplasm of trachea, bronchus and lung: Secondary | ICD-10-CM

## 2022-01-31 DIAGNOSIS — I252 Old myocardial infarction: Secondary | ICD-10-CM

## 2022-01-31 DIAGNOSIS — E559 Vitamin D deficiency, unspecified: Secondary | ICD-10-CM | POA: Diagnosis present

## 2022-01-31 DIAGNOSIS — F419 Anxiety disorder, unspecified: Secondary | ICD-10-CM | POA: Diagnosis present

## 2022-01-31 DIAGNOSIS — Z86010 Personal history of colonic polyps: Secondary | ICD-10-CM

## 2022-01-31 DIAGNOSIS — Z8719 Personal history of other diseases of the digestive system: Secondary | ICD-10-CM

## 2022-01-31 LAB — CBC WITH DIFFERENTIAL/PLATELET
Abs Immature Granulocytes: 0.16 10*3/uL — ABNORMAL HIGH (ref 0.00–0.07)
Basophils Absolute: 0 10*3/uL (ref 0.0–0.1)
Basophils Relative: 0 %
Eosinophils Absolute: 0 10*3/uL (ref 0.0–0.5)
Eosinophils Relative: 0 %
HCT: 25.3 % — ABNORMAL LOW (ref 39.0–52.0)
Hemoglobin: 8.9 g/dL — ABNORMAL LOW (ref 13.0–17.0)
Immature Granulocytes: 1 %
Lymphocytes Relative: 11 %
Lymphs Abs: 1.9 10*3/uL (ref 0.7–4.0)
MCH: 30.4 pg (ref 26.0–34.0)
MCHC: 35.2 g/dL (ref 30.0–36.0)
MCV: 86.3 fL (ref 80.0–100.0)
Monocytes Absolute: 0.8 10*3/uL (ref 0.1–1.0)
Monocytes Relative: 5 %
Neutro Abs: 13.6 10*3/uL — ABNORMAL HIGH (ref 1.7–7.7)
Neutrophils Relative %: 83 %
Platelets: 222 10*3/uL (ref 150–400)
RBC: 2.93 MIL/uL — ABNORMAL LOW (ref 4.22–5.81)
RDW: 14.4 % (ref 11.5–15.5)
WBC: 16.5 10*3/uL — ABNORMAL HIGH (ref 4.0–10.5)
nRBC: 0 % (ref 0.0–0.2)

## 2022-01-31 LAB — COMPREHENSIVE METABOLIC PANEL
ALT: 6 U/L (ref 0–44)
AST: 18 U/L (ref 15–41)
Albumin: <1.5 g/dL — ABNORMAL LOW (ref 3.5–5.0)
Alkaline Phosphatase: 105 U/L (ref 38–126)
Anion gap: 8 (ref 5–15)
BUN: 8 mg/dL (ref 6–20)
CO2: 20 mmol/L — ABNORMAL LOW (ref 22–32)
Calcium: 5.8 mg/dL — CL (ref 8.9–10.3)
Chloride: 103 mmol/L (ref 98–111)
Creatinine, Ser: 1.08 mg/dL (ref 0.61–1.24)
GFR, Estimated: >60 mL/min (ref >60)
Glucose, Bld: 91 mg/dL (ref 70–99)
Potassium: <2 mmol/L — CL (ref 3.5–5.1)
Sodium: 131 mmol/L — ABNORMAL LOW (ref 135–145)
Total Bilirubin: 0.6 mg/dL (ref 0.3–1.2)
Total Protein: 3.3 g/dL — ABNORMAL LOW (ref 6.5–8.1)

## 2022-01-31 LAB — GASTROINTESTINAL PANEL BY PCR, STOOL (REPLACES STOOL CULTURE)

## 2022-01-31 LAB — MRSA NEXT GEN BY PCR, NASAL: MRSA by PCR Next Gen: NOT DETECTED

## 2022-01-31 LAB — URINALYSIS, COMPLETE (UACMP) WITH MICROSCOPIC
Bacteria, UA: NONE SEEN
Bilirubin Urine: NEGATIVE
Glucose, UA: NEGATIVE mg/dL
Ketones, ur: NEGATIVE mg/dL
Leukocytes,Ua: NEGATIVE
Nitrite: NEGATIVE
Protein, ur: NEGATIVE mg/dL
Specific Gravity, Urine: 1.004 — ABNORMAL LOW (ref 1.005–1.030)
Squamous Epithelial / HPF: NONE SEEN (ref 0–5)
pH: 8 (ref 5.0–8.0)

## 2022-01-31 LAB — RESP PANEL BY RT-PCR (FLU A&B, COVID) ARPGX2
Influenza A by PCR: NEGATIVE
Influenza B by PCR: NEGATIVE
SARS Coronavirus 2 by RT PCR: NEGATIVE

## 2022-01-31 LAB — C DIFFICILE QUICK SCREEN W PCR REFLEX
C Diff antigen: NEGATIVE
C Diff interpretation: NOT DETECTED
C Diff toxin: NEGATIVE

## 2022-01-31 LAB — BASIC METABOLIC PANEL
Anion gap: 8 (ref 5–15)
BUN: 9 mg/dL (ref 6–20)
CO2: 18 mmol/L — ABNORMAL LOW (ref 22–32)
Calcium: 5.7 mg/dL — CL (ref 8.9–10.3)
Chloride: 107 mmol/L (ref 98–111)
Creatinine, Ser: 1.08 mg/dL (ref 0.61–1.24)
GFR, Estimated: >60 mL/min (ref >60)
Glucose, Bld: 122 mg/dL — ABNORMAL HIGH (ref 70–99)
Potassium: <2 mmol/L — CL (ref 3.5–5.1)
Sodium: 133 mmol/L — ABNORMAL LOW (ref 135–145)

## 2022-01-31 LAB — MAGNESIUM: Magnesium: 1.4 mg/dL — ABNORMAL LOW (ref 1.7–2.4)

## 2022-01-31 LAB — LACTIC ACID, PLASMA
Lactic Acid, Venous: 4.5 mmol/L (ref 0.5–1.9)
Lactic Acid, Venous: 3.3 mmol/L (ref 0.5–1.9)

## 2022-01-31 LAB — PHOSPHORUS: Phosphorus: 1.6 mg/dL — ABNORMAL LOW (ref 2.5–4.6)

## 2022-01-31 LAB — PROCALCITONIN: Procalcitonin: 0.58 ng/mL

## 2022-01-31 LAB — APTT: aPTT: 33 seconds (ref 24–36)

## 2022-01-31 LAB — PROTIME-INR
INR: 1.3 — ABNORMAL HIGH (ref 0.8–1.2)
Prothrombin Time: 16.1 seconds — ABNORMAL HIGH (ref 11.4–15.2)

## 2022-01-31 LAB — LIPASE, BLOOD: Lipase: 25 U/L (ref 11–51)

## 2022-01-31 MED ORDER — NOREPINEPHRINE 4 MG/250ML-% IV SOLN
2.0000 ug/min | INTRAVENOUS | Status: DC
  Administered 2022-01-31 (×2): 2 ug/min via INTRAVENOUS
  Administered 2022-02-01: 10 ug/min via INTRAVENOUS
  Filled 2022-01-31 (×2): qty 250

## 2022-01-31 MED ORDER — ASPIRIN 81 MG PO TBEC
81.0000 mg | DELAYED_RELEASE_TABLET | Freq: Every day | ORAL | Status: DC
Start: 2022-01-31 — End: 2022-02-01
  Administered 2022-01-31 – 2022-02-01 (×2): 81 mg via ORAL
  Filled 2022-01-31 (×2): qty 1

## 2022-01-31 MED ORDER — LACTATED RINGERS IV SOLN: INTRAVENOUS | Status: DC

## 2022-01-31 MED ORDER — CALCIUM GLUCONATE 10 % IV SOLN: 1.0000 g | Freq: Once | INTRAVENOUS | Status: DC

## 2022-01-31 MED ORDER — VANCOMYCIN HCL 1500 MG/300ML IV SOLN
1500.0000 mg | Freq: Once | INTRAVENOUS | Status: AC
  Administered 2022-01-31: 1500 mg via INTRAVENOUS
  Filled 2022-01-31: qty 300

## 2022-01-31 MED ORDER — SODIUM CHLORIDE 0.9 % IV SOLN: 250.0000 mL | INTRAVENOUS | Status: DC

## 2022-01-31 MED ORDER — FAMOTIDINE 20 MG PO TABS
20.0000 mg | ORAL_TABLET | Freq: Two times a day (BID) | ORAL | Status: DC
  Administered 2022-01-31 – 2022-02-01 (×2): 20 mg via ORAL
  Filled 2022-01-31 (×2): qty 1

## 2022-01-31 MED ORDER — SODIUM CHLORIDE 0.9 % IV SOLN
2.0000 g | INTRAVENOUS | Status: DC
  Administered 2022-01-31: 2 g via INTRAVENOUS
  Filled 2022-01-31 (×2): qty 20

## 2022-01-31 MED ORDER — SODIUM CHLORIDE 0.9 % IV SOLN
250.0000 mL | INTRAVENOUS | Status: DC
  Administered 2022-01-31: 250 mL via INTRAVENOUS

## 2022-01-31 MED ORDER — PHENYLEPHRINE HCL-NACL 20-0.9 MG/250ML-% IV SOLN
25.0000 ug/min | INTRAVENOUS | Status: DC
  Filled 2022-01-31: qty 250

## 2022-01-31 MED ORDER — MAGNESIUM SULFATE 4 GM/100ML IV SOLN
4.0000 g | Freq: Once | INTRAVENOUS | Status: AC
  Administered 2022-01-31: 4 g via INTRAVENOUS
  Filled 2022-01-31 (×2): qty 100

## 2022-01-31 MED ORDER — NYSTATIN 100000 UNIT/ML MT SUSP
5.0000 mL | Freq: Four times a day (QID) | OROMUCOSAL | Status: DC
  Administered 2022-01-31 – 2022-02-01 (×5): 500000 [IU] via ORAL
  Filled 2022-01-31 (×6): qty 5

## 2022-01-31 MED ORDER — LACTATED RINGERS IV BOLUS: 1000.0000 mL | Freq: Once | INTRAVENOUS | Status: DC

## 2022-01-31 MED ORDER — PRAMIPEXOLE DIHYDROCHLORIDE 0.25 MG PO TABS: 0.1250 mg | ORAL_TABLET | Freq: Every evening | ORAL | Status: DC | PRN

## 2022-01-31 MED ORDER — POTASSIUM CHLORIDE 10 MEQ/100ML IV SOLN: 10.0000 meq | INTRAVENOUS | Status: DC

## 2022-01-31 MED ORDER — ONDANSETRON HCL 4 MG/2ML IJ SOLN
4.0000 mg | Freq: Once | INTRAMUSCULAR | Status: AC
  Administered 2022-01-31: 4 mg via INTRAVENOUS
  Filled 2022-01-31: qty 2

## 2022-01-31 MED ORDER — SODIUM CHLORIDE 0.9 % IV BOLUS (SEPSIS)
1000.0000 mL | Freq: Once | INTRAVENOUS | Status: AC
  Administered 2022-01-31: 1000 mL via INTRAVENOUS

## 2022-01-31 MED ORDER — POTASSIUM CHLORIDE 2 MEQ/ML IV SOLN
INTRAVENOUS | Status: DC
  Filled 2022-01-31 (×3): qty 1000

## 2022-01-31 MED ORDER — DULOXETINE HCL 60 MG PO CPEP
60.0000 mg | ORAL_CAPSULE | Freq: Every day | ORAL | Status: DC
  Administered 2022-01-31 – 2022-02-01 (×2): 60 mg via ORAL
  Filled 2022-01-31 (×2): qty 1

## 2022-01-31 MED ORDER — METRONIDAZOLE 500 MG/100ML IV SOLN
500.0000 mg | Freq: Once | INTRAVENOUS | Status: AC
  Administered 2022-01-31: 500 mg via INTRAVENOUS

## 2022-01-31 MED ORDER — ONDANSETRON HCL 4 MG/2ML IJ SOLN
4.0000 mg | Freq: Once | INTRAMUSCULAR | Status: AC
Start: 2022-01-31 — End: 2022-01-31
  Administered 2022-01-31: 4 mg via INTRAVENOUS
  Filled 2022-01-31: qty 2

## 2022-01-31 MED ORDER — THIAMINE HCL 100 MG PO TABS
100.0000 mg | ORAL_TABLET | Freq: Every day | ORAL | Status: DC
Start: 2022-01-31 — End: 2022-02-01
  Administered 2022-01-31 – 2022-02-01 (×2): 100 mg via ORAL
  Filled 2022-01-31 (×2): qty 1

## 2022-01-31 MED ORDER — CALCIUM GLUCONATE 10 % IV SOLN
1.0000 g | Freq: Once | INTRAVENOUS | Status: AC
  Administered 2022-01-31: 1 g via INTRAVENOUS
  Filled 2022-01-31 (×2): qty 10

## 2022-01-31 MED ORDER — SODIUM CHLORIDE 0.9 % IV BOLUS
1000.0000 mL | Freq: Once | INTRAVENOUS | Status: AC
  Administered 2022-01-31: 1000 mL via INTRAVENOUS

## 2022-01-31 MED ORDER — SODIUM CHLORIDE 0.9 % IV SOLN
500.0000 mg | INTRAVENOUS | Status: DC
  Administered 2022-01-31 – 2022-02-01 (×2): 500 mg via INTRAVENOUS
  Filled 2022-01-31 (×2): qty 5

## 2022-01-31 MED ORDER — ACETAMINOPHEN 325 MG PO TABS: 650.0000 mg | ORAL_TABLET | ORAL | Status: DC | PRN

## 2022-01-31 MED ORDER — ATORVASTATIN CALCIUM 20 MG PO TABS
20.0000 mg | ORAL_TABLET | ORAL | Status: DC
  Administered 2022-02-01: 20 mg via ORAL
  Filled 2022-01-31: qty 1

## 2022-01-31 MED ORDER — ALBUMIN HUMAN 5 % IV SOLN
25.0000 g | Freq: Once | INTRAVENOUS | Status: AC
  Administered 2022-02-01: 25 g via INTRAVENOUS
  Filled 2022-01-31: qty 500

## 2022-01-31 MED ORDER — IPRATROPIUM-ALBUTEROL 0.5-2.5 (3) MG/3ML IN SOLN: 3.0000 mL | Freq: Four times a day (QID) | RESPIRATORY_TRACT | Status: DC | PRN

## 2022-01-31 MED ORDER — POTASSIUM CHLORIDE 10 MEQ/100ML IV SOLN
10.0000 meq | INTRAVENOUS | Status: AC
  Administered 2022-01-31 (×4): 10 meq via INTRAVENOUS
  Filled 2022-01-31 (×5): qty 100

## 2022-01-31 MED ORDER — SODIUM CHLORIDE 0.9 % IV SOLN
2.0000 g | Freq: Once | INTRAVENOUS | Status: AC
  Administered 2022-01-31: 2 g via INTRAVENOUS
  Filled 2022-01-31: qty 12.5

## 2022-01-31 MED ORDER — DEXTROSE 5 % IV SOLN
30.0000 mmol | Freq: Once | INTRAVENOUS | Status: DC
  Filled 2022-01-31: qty 10

## 2022-01-31 MED ORDER — ALBUMIN HUMAN 25 % IV SOLN
25.0000 g | Freq: Four times a day (QID) | INTRAVENOUS | Status: AC
  Administered 2022-01-31 – 2022-02-01 (×4): 25 g via INTRAVENOUS
  Filled 2022-01-31 (×4): qty 100

## 2022-01-31 MED ORDER — NICOTINE 21 MG/24HR TD PT24
21.0000 mg | MEDICATED_PATCH | Freq: Every day | TRANSDERMAL | Status: DC
  Administered 2022-01-31 – 2022-02-01 (×2): 21 mg via TRANSDERMAL
  Filled 2022-01-31 (×2): qty 1

## 2022-01-31 MED ORDER — ENOXAPARIN SODIUM 40 MG/0.4ML IJ SOSY
40.0000 mg | PREFILLED_SYRINGE | INTRAMUSCULAR | Status: DC
  Administered 2022-01-31: 40 mg via SUBCUTANEOUS
  Filled 2022-01-31: qty 0.4

## 2022-01-31 MED ORDER — CHLORHEXIDINE GLUCONATE CLOTH 2 % EX PADS
6.0000 | MEDICATED_PAD | Freq: Every day | CUTANEOUS | Status: DC
  Administered 2022-01-31 – 2022-02-01 (×2): 6 via TOPICAL

## 2022-01-31 MED ORDER — ONDANSETRON HCL 4 MG/2ML IJ SOLN
4.0000 mg | Freq: Four times a day (QID) | INTRAMUSCULAR | Status: DC | PRN
  Administered 2022-02-01: 4 mg via INTRAVENOUS
  Filled 2022-01-31: qty 2

## 2022-01-31 MED ORDER — IOHEXOL 300 MG/ML  SOLN
100.0000 mL | Freq: Once | INTRAMUSCULAR | Status: AC | PRN
  Administered 2022-01-31: 100 mL via INTRAVENOUS

## 2022-01-31 NOTE — Progress Notes (Signed)
An USGPIV (ultrasound guided PIV) 20G x 1.25 " on LFA has been placed for short-term vasopressor infusion. A correctly placed ivWatch must be used when administering Vasopressors. Should this treatment be needed beyond 72 hours, central line access should be obtained.  It will be the responsibility of the bedside nurse to follow best practice to prevent extravasations.   

## 2022-01-31 NOTE — Progress Notes (Signed)
An USGPIV (ultrasound guided PIV) has been placed for short-term vasopressor infusion. A correctly placed ivWatch must be used when administering Vasopressors. Should this treatment be needed beyond 72 hours, central line access should be obtained.  It will be the responsibility of the bedside nurse to follow best practice to prevent extravasations.   ?

## 2022-01-31 NOTE — ED Triage Notes (Signed)
Pt from home via [facility] with reports of hypotension, diarrhea, and vomiting 1 month.
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
## 2022-01-31 NOTE — Progress Notes (Signed)
[place] Progress Note Patient Name: Kevin Alexander DOB: 30-Jul-1965 MRN: [id number]   Date of Service  01/26/2022  HPI/Events of Note  SBP in the 70's on 10 mcg of peripheral Norepinephrine gtt. Serum albumin 1.5 gm / dl.  eICU Interventions  Albumin 5 % 500 ml iv bolus x 1, peripheral Phenylephrine gtt ordered.        [name] Kevin Alexander 01/17/2022, 11:45 PM

## 2022-01-31 NOTE — Consult Note (Addendum)
PHARMACY -  BRIEF ANTIBIOTIC NOTE   Pharmacy has received consult(s) for Cefepime and Vancomycin from an ED provider.  The patient's profile has been reviewed for ht/wt/allergies/indication/available labs.    One time order(s) placed for Cefepime 2g IV and Vancomycin '1500mg'$  IV x 1 dose.  Further antibiotics/pharmacy consults should be ordered by admitting physician if indicated.                       Thank you, Pearla Dubonnet 02/12/2022  10:39 AM

## 2022-01-31 NOTE — ED Notes (Signed)
Pt transported to ICU by RN with monitor. 

## 2022-01-31 NOTE — Progress Notes (Signed)
Elink following sepsis bundle. °

## 2022-01-31 NOTE — Consult Note (Signed)
CODE SEPSIS - PHARMACY COMMUNICATION  **Broad Spectrum Antibiotics should be administered within 1 hour of Sepsis diagnosis**  Time Code Sepsis Called/Page Received: 1031  Antibiotics Ordered: cefepime, flagyl  Time of 1st antibiotic administration: Cefepime 2g IV given @ 1052  Additional action taken by pharmacy: none  If necessary, Name of Provider/Nurse Contacted: n/a    Pearla Dubonnet ,PharmD Clinical Pharmacist  01/17/2022  11:58 AM

## 2022-01-31 NOTE — ED Provider Notes (Signed)
[facility] Provider Note    Event Date/Time   First MD Initiated Contact with Patient 01/25/2022 1012     (approximate)   History   Chief Complaint: Hypotension   HPI  Kevin Alexander is a 56 y.o. male with a past history of GERD, MRSA infection, CAD, esophageal candidiasis, COPD who comes the ED complaining of poor oral intake, vomiting and diarrhea for the past month.  For the last several days he has gotten dizzy and felt like he was going to pass out anytime he stands up and has essentially been bedbound.  EMS notes that he has been incontinent of liquid stool.  Patient complains of generalized abdominal discomfort, worse on the right side.  Denies head trauma.  Denies headache chest pain or shortness of breath, denies fever.  EMS report giving 1000 mL IV fluid bolus prior to arrival due to initial blood pressure of 70/40.     Physical Exam   Triage Vital Signs: ED Triage Vitals [02/14/2022 1031]  Enc Vitals Group     BP (!) 80/64     Pulse Rate 95     Resp (!) 34     Temp 98.5 F (36.9 C)     Temp src      SpO2 100 %     Weight      Height      Head Circumference      Peak Flow      Pain Score 0     Pain Loc      Pain Edu?      Excl. in [place]?     Most recent vital signs: Vitals:   February 11, 2022 1900 Feb 11, 2022 2000  BP: 93/82 97/82  Pulse:    Resp: (!) 32 (!) 32  Temp: (!) 93.7 F (34.3 C) (!) 95.2 F (35.1 C)  SpO2:  97%    General: Awake, no distress.  Ill-appearing, emaciated CV:  Good peripheral perfusion.  Tachycardia heart rate 105 Resp:  Normal effort.  Tachypnea respiratory rate of 30.  Clear to auscultation bilaterally Abd:  No distention.  Soft with right-sided tenderness.  No guarding or rebound or rigidity. Other:  Dry mucous membranes with oral thrush.   ED Results / Procedures / Treatments   Labs (all labs ordered are listed, but only abnormal results are displayed) Labs Reviewed  URINE CULTURE - Abnormal; Notable for  the following components:      Result Value   Culture   (*)    Value: <10,000 COLONIES/mL INSIGNIFICANT GROWTH Performed at [facility], [address] [place], [place] [address]    All other components within normal limits  LACTIC ACID, PLASMA - Abnormal; Notable for the following components:   Lactic Acid, Venous 4.5 (*)    All other components within normal limits  LACTIC ACID, PLASMA - Abnormal; Notable for the following components:   Lactic Acid, Venous 3.3 (*)    All other components within normal limits  COMPREHENSIVE METABOLIC PANEL - Abnormal; Notable for the following components:   Sodium 131 (*)    Potassium <2.0 (*)    CO2 20 (*)    Calcium 5.8 (*)    Total Protein 3.3 (*)    Albumin <1.5 (*)    All other components within normal limits  CBC WITH DIFFERENTIAL/PLATELET - Abnormal; Notable for the following components:   WBC 16.5 (*)    RBC 2.93 (*)    Hemoglobin 8.9 (*)  HCT 25.3 (*)    Neutro Abs 13.6 (*)    Abs Immature Granulocytes 0.16 (*)    All other components within normal limits  PROTIME-INR - Abnormal; Notable for the following components:   Prothrombin Time 16.1 (*)    INR 1.3 (*)    All other components within normal limits  URINALYSIS, COMPLETE (UACMP) WITH MICROSCOPIC - Abnormal; Notable for the following components:   Color, Urine STRAW (*)    APPearance CLEAR (*)    Specific Gravity, Urine 1.004 (*)    Hgb urine dipstick SMALL (*)    All other components within normal limits  MAGNESIUM - Abnormal; Notable for the following components:   Magnesium 1.4 (*)    All other components within normal limits  BASIC METABOLIC PANEL - Abnormal; Notable for the following components:   Sodium 133 (*)    Potassium <2.0 (*)    CO2 18 (*)    Glucose, Bld 122 (*)    Calcium 5.7 (*)    All other components within normal limits  BASIC METABOLIC PANEL - Abnormal; Notable for the following components:   Sodium 130 (*)    Potassium <2.0 (*)    CO2 17  (*)    Glucose, Bld 164 (*)    Calcium 6.6 (*)    All other components within normal limits  PHOSPHORUS - Abnormal; Notable for the following components:   Phosphorus 1.6 (*)    All other components within normal limits  MAGNESIUM - Abnormal; Notable for the following components:   Magnesium 3.2 (*)    All other components within normal limits  PHOSPHORUS - Abnormal; Notable for the following components:   Phosphorus 1.3 (*)    All other components within normal limits  PHOSPHORUS - Abnormal; Notable for the following components:   Phosphorus 1.2 (*)    All other components within normal limits  MAGNESIUM - Abnormal; Notable for the following components:   Magnesium 2.9 (*)    All other components within normal limits  CBC - Abnormal; Notable for the following components:   WBC 15.4 (*)    RBC 2.20 (*)    Hemoglobin 6.7 (*)    HCT 19.1 (*)    All other components within normal limits  BASIC METABOLIC PANEL - Abnormal; Notable for the following components:   Sodium 134 (*)    Potassium 2.1 (*)    CO2 16 (*)    Glucose, Bld 172 (*)    Calcium 7.0 (*)    All other components within normal limits  LACTIC ACID, PLASMA - Abnormal; Notable for the following components:   Lactic Acid, Venous 5.3 (*)    All other components within normal limits  LACTIC ACID, PLASMA - Abnormal; Notable for the following components:   Lactic Acid, Venous >9.0 (*)    All other components within normal limits  BRAIN NATRIURETIC PEPTIDE - Abnormal; Notable for the following components:   B Natriuretic Peptide 978.6 (*)    All other components within normal limits  BASIC METABOLIC PANEL - Abnormal; Notable for the following components:   CO2 9 (*)    Glucose, Bld 305 (*)    Calcium 6.5 (*)    Anion gap 20 (*)    All other components within normal limits  BLOOD GAS, ARTERIAL - Abnormal; Notable for the following components:   pH, Arterial 7.1 (*)    pCO2 arterial 23 (*)    pO2, Arterial 451 (*)     Bicarbonate 7.1 (*)  Acid-base deficit 20.9 (*)    All other components within normal limits  LACTATE DEHYDROGENASE, PLEURAL OR PERITONEAL FLUID - Abnormal; Notable for the following components:   LD, Fluid 68 (*)    All other components within normal limits  BODY FLUID CELL COUNT WITH DIFFERENTIAL - Abnormal; Notable for the following components:   Color, Fluid PINK (*)    Appearance, Fluid CLOUDY (*)    All other components within normal limits  COOXEMETRY PANEL - Abnormal; Notable for the following components:   Total hemoglobin 8.8 (*)    All other components within normal limits  LACTIC ACID, PLASMA - Abnormal; Notable for the following components:   Lactic Acid, Venous >9.0 (*)    All other components within normal limits  GLUCOSE, CAPILLARY - Abnormal; Notable for the following components:   Glucose-Capillary 193 (*)    All other components within normal limits  TROPONIN I (HIGH SENSITIVITY) - Abnormal; Notable for the following components:   Troponin I (High Sensitivity) 198 (*)    All other components within normal limits  TROPONIN I (HIGH SENSITIVITY) - Abnormal; Notable for the following components:   Troponin I (High Sensitivity) 2,735 (*)    All other components within normal limits  TROPONIN I (HIGH SENSITIVITY) - Abnormal; Notable for the following components:   Troponin I (High Sensitivity) 2,632 (*)    All other components within normal limits  RESP PANEL BY RT-PCR (FLU A&B, COVID) ARPGX2  CULTURE, BLOOD (ROUTINE X 2)  CULTURE, BLOOD (ROUTINE X 2)  GASTROINTESTINAL PANEL BY PCR, STOOL (REPLACES STOOL CULTURE)  C DIFFICILE QUICK SCREEN W PCR REFLEX    MRSA NEXT GEN BY PCR, NASAL  CULTURE, RESPIRATORY W GRAM STAIN  BODY FLUID CULTURE W GRAM STAIN  APTT  LIPASE, BLOOD  PROCALCITONIN  STREP PNEUMONIAE URINARY ANTIGEN  LEGIONELLA PNEUMOPHILA SEROGP 1 UR AG  PROCALCITONIN  CORTISOL  PROTEIN, PLEURAL OR PERITONEAL FLUID  PROTEIN, BODY FLUID (OTHER)  TYPE AND  SCREEN  PREPARE RBC (CROSSMATCH)  PREPARE RBC (CROSSMATCH)     EKG Interpreted by me Sinus tachycardia rate 123.  Normal axis, normal intervals.  Poor R wave progression.  Normal ST segments and T waves.   RADIOLOGY Chest x-ray interpreted by me, appears unremarkable without edema effusion or consolidation.  Radiology report reviewed.  CT abdomen pelvis report reviewed, multiple subtle findings without clear explanation for patient's presentation.   PROCEDURES:  .Critical Care  Performed by: Carrie Mew, MD Authorized by: Carrie Mew, MD   Critical care provider statement:    Critical care time (minutes):  45   Critical care time was exclusive of:  Separately billable procedures and treating other patients   Critical care was necessary to treat or prevent imminent or life-threatening deterioration of the following conditions:  Sepsis, shock, circulatory failure and metabolic crisis   Critical care was time spent personally by me on the following activities:  Development of treatment plan with patient or surrogate, discussions with consultants, evaluation of patient's response to treatment, examination of patient, obtaining history from patient or surrogate, ordering and performing treatments and interventions, ordering and review of laboratory studies, ordering and review of radiographic studies, pulse oximetry, re-evaluation of patient's condition and review of old charts    MEDICATIONS ORDERED IN ED: Medications  potassium chloride 10 mEq in 100 mL IVPB ( Intravenous Infusion Verify 02-25-22 0100)  sodium chloride 0.9 % bolus 1,000 mL (0 mLs Intravenous Stopped 01/17/2022 1216)  ceFEPIme (MAXIPIME) 2 g in sodium chloride  0.9 % 100 mL IVPB (0 g Intravenous Stopped 01/23/2022 1131)  metroNIDAZOLE (FLAGYL) IVPB 500 mg (0 mg Intravenous Stopped 01/24/2022 1300)  ondansetron (ZOFRAN) injection 4 mg (4 mg Intravenous Given 02/11/2022 1052)  iohexol (OMNIPAQUE) 300 MG/ML solution 100  mL (100 mLs Intravenous Contrast Given 01/20/2022 1458)  sodium chloride 0.9 % bolus 1,000 mL (0 mLs Intravenous Stopped 01/17/2022 1422)  ondansetron (ZOFRAN) injection 4 mg (4 mg Intravenous Given 02/06/2022 1430)  magnesium sulfate IVPB 4 g 100 mL (0 g Intravenous Paused 02/16/2022 2154)  albumin human 25 % solution 25 g (25 g Intravenous New Bag/Given 03-Mar-2022 1022)  vancomycin (VANCOREADY) IVPB 1500 mg/300 mL (0 mg Intravenous Stopped 03/03/2022 0016)  calcium gluconate inj 10% (1 g) URGENT USE ONLY! (1 g Intravenous Given 01/30/2022 2125)  albumin human 5 % solution 25 g (0 g Intravenous Stopped 03/03/22 0132)  potassium chloride 10 mEq in 100 mL IVPB (10 mEq Intravenous New Bag/Given 03/03/2022 0820)  potassium chloride SA (KLOR-CON M) CR tablet 40 mEq (40 mEq Oral Given March 03, 2022 0523)  fentaNYL (SUBLIMAZE) injection 50 mcg (50 mcg Intravenous Given 03-Mar-2022 0749)  fentaNYL (SUBLIMAZE) injection 25 mcg (25 mcg Intravenous Given 03-03-22 0855)  potassium chloride 10 mEq in 50 mL *CENTRAL LINE* IVPB (10 mEq Intravenous New Bag/Given 03/03/2022 1246)  methylPREDNISolone sodium succinate (SOLU-MEDROL) 125 mg/2 mL injection 125 mg (125 mg Intravenous Given March 03, 2022 1149)  furosemide (LASIX) injection 40 mg (40 mg Intravenous Given 2022-03-03 1149)  morphine (PF) 2 MG/ML injection 1 mg (1 mg Intravenous Given March 03, 2022 1201)  morphine (PF) 2 MG/ML injection (  Duplicate 5/78/46 9629)  fentaNYL (SUBLIMAZE) injection 100 mcg (0 mcg Intravenous Duplicate 11/14/39 3244)  etomidate (AMIDATE) injection 20 mg (0 mg Intravenous Duplicate 0/10/27 2536)  rocuronium bromide 10 mg/mL (PF) syringe (0 mg Intravenous Duplicate 6/44/03 4742)  rocuronium bromide 100 MG/10ML SOSY (50 mg  Given March 03, 2022 1220)  etomidate (AMIDATE) 2 MG/ML injection (20 mg  Given 03-Mar-2022 1221)  midazolam (VERSED) 2 MG/2ML injection (2 mg  Given 03/03/22 1228)  fentaNYL (SUBLIMAZE) 50 MCG/ML injection (50 mcg  Given March 03, 2022 1228)  sodium bicarbonate injection 100 mEq  (100 mEq Intravenous Given 03/03/2022 1218)  sodium bicarbonate 1 mEq/mL injection (  Duplicate 5/95/63 8756)  sodium bicarbonate injection 100 mEq (100 mEq Intravenous Given March 03, 2022 1637)     IMPRESSION / MDM / ASSESSMENT AND PLAN / ED COURSE  I reviewed the triage vital signs and the nursing notes.                              Differential diagnosis includes, but is not limited to, renal failure, anemia, electrolyte abnormality, C. difficile colitis, biliary disease, pancreatitis, appendicitis, diverticulitis, peptic ulcer, GI bleed  Patient's presentation is most consistent with acute presentation with potential threat to life or bodily function.   Clinical Course as of 01/29/2022 2351  Tue Jan 31, 2022  1021 Pt arrives with poor oral intake, vomiting, diarrhea, dehydration, possible septic shock. Already received 1000 mL IVF by EMS PTA. Will give additional 1000 mL IVF, abx, zofran, check labs and CT a/p. [PS]  1123 Lactate 4.5, white blood cell count 16, consistent with septic shock.  Continue IV fluid boluses. [PS]  1255 Hypotensive again. Will start PIV levophed, give additional 1L IVF ns [PS]    Clinical Course User Index [PS] Carrie Mew, MD     FINAL CLINICAL IMPRESSION(S) / ED DIAGNOSES   Final  diagnoses:  Septic shock ([place])     Rx / DC Orders   ED Discharge Orders     None        Note:  This document was prepared using Dragon voice recognition software and may include unintentional dictation errors.   Carrie Mew, MD 02/12/2022 2351

## 2022-02-01 ENCOUNTER — Inpatient Hospital Stay: Payer: Medicare Other

## 2022-02-01 DIAGNOSIS — A419 Sepsis, unspecified organism: Secondary | ICD-10-CM | POA: Diagnosis not present

## 2022-02-01 DIAGNOSIS — R6521 Severe sepsis with septic shock: Secondary | ICD-10-CM | POA: Diagnosis not present

## 2022-02-01 LAB — TROPONIN I (HIGH SENSITIVITY)
Troponin I (High Sensitivity): 198 ng/L (ref <18)
Troponin I (High Sensitivity): 2735 ng/L (ref <18)
Troponin I (High Sensitivity): 2632 ng/L (ref <18)

## 2022-02-01 LAB — BODY FLUID CELL COUNT WITH DIFFERENTIAL
Eos, Fluid: 0 %
Lymphs, Fluid: 11 %
Monocyte-Macrophage-Serous Fluid: 32 %
Neutrophil Count, Fluid: 57 %
Total Nucleated Cell Count, Fluid: 74 cu mm

## 2022-02-01 LAB — URINE CULTURE: Culture: <10000 — AB

## 2022-02-01 LAB — CBC
HCT: 19.1 % — ABNORMAL LOW (ref 39.0–52.0)
Hemoglobin: 6.7 g/dL — ABNORMAL LOW (ref 13.0–17.0)
MCH: 30.5 pg (ref 26.0–34.0)
MCHC: 35.1 g/dL (ref 30.0–36.0)
MCV: 86.8 fL (ref 80.0–100.0)
Platelets: 195 10*3/uL (ref 150–400)
RBC: 2.2 MIL/uL — ABNORMAL LOW (ref 4.22–5.81)
RDW: 14.7 % (ref 11.5–15.5)
WBC: 15.4 10*3/uL — ABNORMAL HIGH (ref 4.0–10.5)
nRBC: 0 % (ref 0.0–0.2)

## 2022-02-01 LAB — BASIC METABOLIC PANEL
Anion gap: 20 — ABNORMAL HIGH (ref 5–15)
Anion gap: 9 (ref 5–15)
Anion gap: 9 (ref 5–15)
BUN: 6 mg/dL (ref 6–20)
BUN: 6 mg/dL (ref 6–20)
BUN: 7 mg/dL (ref 6–20)
CO2: 16 mmol/L — ABNORMAL LOW (ref 22–32)
CO2: 17 mmol/L — ABNORMAL LOW (ref 22–32)
CO2: 9 mmol/L — ABNORMAL LOW (ref 22–32)
Calcium: 6.5 mg/dL — ABNORMAL LOW (ref 8.9–10.3)
Calcium: 6.6 mg/dL — ABNORMAL LOW (ref 8.9–10.3)
Calcium: 7 mg/dL — ABNORMAL LOW (ref 8.9–10.3)
Chloride: 104 mmol/L (ref 98–111)
Chloride: 109 mmol/L (ref 98–111)
Chloride: 110 mmol/L (ref 98–111)
Creatinine, Ser: 1.01 mg/dL (ref 0.61–1.24)
Creatinine, Ser: 1.06 mg/dL (ref 0.61–1.24)
Creatinine, Ser: 1.22 mg/dL (ref 0.61–1.24)
GFR, Estimated: >60 mL/min (ref >60)
GFR, Estimated: >60 mL/min (ref >60)
GFR, Estimated: >60 mL/min (ref >60)
Glucose, Bld: 164 mg/dL — ABNORMAL HIGH (ref 70–99)
Glucose, Bld: 172 mg/dL — ABNORMAL HIGH (ref 70–99)
Glucose, Bld: 305 mg/dL — ABNORMAL HIGH (ref 70–99)
Potassium: 2.1 mmol/L — CL (ref 3.5–5.1)
Potassium: 3.7 mmol/L (ref 3.5–5.1)
Potassium: <2 mmol/L — CL (ref 3.5–5.1)
Sodium: 130 mmol/L — ABNORMAL LOW (ref 135–145)
Sodium: 134 mmol/L — ABNORMAL LOW (ref 135–145)
Sodium: 139 mmol/L (ref 135–145)

## 2022-02-01 LAB — GLUCOSE, CAPILLARY: Glucose-Capillary: 193 mg/dL — ABNORMAL HIGH (ref 70–99)

## 2022-02-01 LAB — COOXEMETRY PANEL
Carboxyhemoglobin: 1.4 % (ref 0.5–1.5)
Methemoglobin: <0.7 % (ref 0.0–1.5)
O2 Saturation: 80.3 %
Total hemoglobin: 8.8 g/dL — ABNORMAL LOW (ref 12.0–16.0)
Total oxygen content: 79.1 %

## 2022-02-01 LAB — BRAIN NATRIURETIC PEPTIDE: B Natriuretic Peptide: 978.6 pg/mL — ABNORMAL HIGH (ref 0.0–100.0)

## 2022-02-01 LAB — PREPARE RBC (CROSSMATCH)

## 2022-02-01 LAB — PROTEIN, PLEURAL OR PERITONEAL FLUID: Total protein, fluid: <3 g/dL

## 2022-02-01 LAB — STREP PNEUMONIAE URINARY ANTIGEN: Strep Pneumo Urinary Antigen: NEGATIVE

## 2022-02-01 LAB — CORTISOL: Cortisol, Plasma: 16.9 ug/dL

## 2022-02-01 LAB — LACTIC ACID, PLASMA
Lactic Acid, Venous: 5.3 mmol/L (ref 0.5–1.9)
Lactic Acid, Venous: >9 mmol/L (ref 0.5–1.9)
Lactic Acid, Venous: >9 mmol/L (ref 0.5–1.9)

## 2022-02-01 LAB — PROCALCITONIN: Procalcitonin: 0.55 ng/mL

## 2022-02-01 LAB — MAGNESIUM
Magnesium: 2.9 mg/dL — ABNORMAL HIGH (ref 1.7–2.4)
Magnesium: 3.2 mg/dL — ABNORMAL HIGH (ref 1.7–2.4)

## 2022-02-01 LAB — PHOSPHORUS
Phosphorus: 1.2 mg/dL — ABNORMAL LOW (ref 2.5–4.6)
Phosphorus: 1.3 mg/dL — ABNORMAL LOW (ref 2.5–4.6)

## 2022-02-01 LAB — LACTATE DEHYDROGENASE, PLEURAL OR PERITONEAL FLUID: LD, Fluid: 68 U/L — ABNORMAL HIGH (ref 3–23)

## 2022-02-01 MED ORDER — NOREPINEPHRINE 16 MG/250ML-% IV SOLN
0.0000 ug/min | INTRAVENOUS | Status: DC
  Administered 2022-02-01: 10 ug/min via INTRAVENOUS
  Filled 2022-02-01 (×4): qty 250

## 2022-02-01 MED ORDER — MIDAZOLAM HCL 2 MG/2ML IJ SOLN: 2.0000 mg | INTRAMUSCULAR | Status: DC | PRN

## 2022-02-01 MED ORDER — SODIUM BICARBONATE 8.4 % IV SOLN
100.0000 meq | Freq: Once | INTRAVENOUS | Status: AC
  Administered 2022-02-01: 100 meq via INTRAVENOUS

## 2022-02-01 MED ORDER — SODIUM BICARBONATE 8.4 % IV SOLN
INTRAVENOUS | Status: AC
  Filled 2022-02-01: qty 100

## 2022-02-01 MED ORDER — GLYCOPYRROLATE 0.2 MG/ML IJ SOLN: 0.2000 mg | INTRAMUSCULAR | Status: DC | PRN

## 2022-02-01 MED ORDER — METRONIDAZOLE 500 MG/100ML IV SOLN
500.0000 mg | Freq: Three times a day (TID) | INTRAVENOUS | Status: DC
  Administered 2022-02-01: 500 mg via INTRAVENOUS
  Filled 2022-02-01 (×3): qty 100

## 2022-02-01 MED ORDER — FUROSEMIDE 10 MG/ML IJ SOLN: 20.0000 mg | Freq: Once | INTRAMUSCULAR | Status: DC

## 2022-02-01 MED ORDER — MORPHINE SULFATE (PF) 2 MG/ML IV SOLN
1.0000 mg | Freq: Once | INTRAVENOUS | Status: AC
  Administered 2022-02-01: 1 mg via INTRAVENOUS

## 2022-02-01 MED ORDER — MORPHINE SULFATE (PF) 2 MG/ML IV SOLN
INTRAVENOUS | Status: AC
  Filled 2022-02-01: qty 1

## 2022-02-01 MED ORDER — SODIUM CHLORIDE 0.9 % IV SOLN: INTRAVENOUS | Status: DC

## 2022-02-01 MED ORDER — SODIUM BICARBONATE 8.4 % IV SOLN
INTRAVENOUS | Status: AC
  Filled 2022-02-01: qty 50

## 2022-02-01 MED ORDER — EPINEPHRINE HCL 5 MG/250ML IV SOLN IN NS
0.5000 ug/min | INTRAVENOUS | Status: DC
  Administered 2022-02-01: 2 ug/min via INTRAVENOUS
  Filled 2022-02-01 (×2): qty 250

## 2022-02-01 MED ORDER — FENTANYL BOLUS VIA INFUSION: 100.0000 ug | INTRAVENOUS | Status: DC | PRN

## 2022-02-01 MED ORDER — K PHOS MONO-SOD PHOS DI & MONO 155-852-130 MG PO TABS
500.0000 mg | ORAL_TABLET | Freq: Four times a day (QID) | ORAL | Status: DC
  Administered 2022-02-01: 500 mg via ORAL
  Filled 2022-02-01 (×3): qty 2

## 2022-02-01 MED ORDER — ROCURONIUM BROMIDE 10 MG/ML (PF) SYRINGE: 50.0000 mg | PREFILLED_SYRINGE | Freq: Once | INTRAVENOUS | Status: AC

## 2022-02-01 MED ORDER — FUROSEMIDE 10 MG/ML IJ SOLN
40.0000 mg | Freq: Once | INTRAMUSCULAR | Status: AC
  Administered 2022-02-01: 40 mg via INTRAVENOUS
  Filled 2022-02-01: qty 4

## 2022-02-01 MED ORDER — GLYCOPYRROLATE 1 MG PO TABS: 1.0000 mg | ORAL_TABLET | ORAL | Status: DC | PRN

## 2022-02-01 MED ORDER — FENTANYL CITRATE PF 50 MCG/ML IJ SOSY
25.0000 ug | PREFILLED_SYRINGE | Freq: Once | INTRAMUSCULAR | Status: AC
  Administered 2022-02-01: 25 ug via INTRAVENOUS
  Filled 2022-02-01: qty 1

## 2022-02-01 MED ORDER — PROMETHAZINE HCL 25 MG/ML IJ SOLN: 12.5000 mg | Freq: Four times a day (QID) | INTRAMUSCULAR | Status: DC | PRN

## 2022-02-01 MED ORDER — OMEPRAZOLE 20 MG PO CPDR
40.0000 mg | DELAYED_RELEASE_CAPSULE | ORAL | Status: DC
  Administered 2022-02-01: 40 mg via ORAL
  Filled 2022-02-01 (×2): qty 2

## 2022-02-01 MED ORDER — FENTANYL CITRATE PF 50 MCG/ML IJ SOSY
50.0000 ug | PREFILLED_SYRINGE | Freq: Once | INTRAMUSCULAR | Status: AC
  Administered 2022-02-01: 50 ug via INTRAVENOUS
  Filled 2022-02-01: qty 1

## 2022-02-01 MED ORDER — LEVALBUTEROL HCL 1.25 MG/0.5ML IN NEBU
1.2500 mg | INHALATION_SOLUTION | Freq: Four times a day (QID) | RESPIRATORY_TRACT | Status: DC
  Administered 2022-02-01: 1.25 mg via RESPIRATORY_TRACT
  Filled 2022-02-01 (×2): qty 0.5

## 2022-02-01 MED ORDER — POLYETHYLENE GLYCOL 3350 17 G PO PACK
17.0000 g | PACK | Freq: Every day | ORAL | Status: DC
  Administered 2022-02-01: 17 g

## 2022-02-01 MED ORDER — METHYLPREDNISOLONE SODIUM SUCC 40 MG IJ SOLR: 40.0000 mg | Freq: Every day | INTRAMUSCULAR | Status: DC

## 2022-02-01 MED ORDER — VASOPRESSIN 20 UNITS/100 ML INFUSION FOR SHOCK
0.0000 [IU]/min | INTRAVENOUS | Status: DC
  Administered 2022-02-01: 0.03 [IU]/min via INTRAVENOUS
  Filled 2022-02-01 (×2): qty 100

## 2022-02-01 MED ORDER — SODIUM CHLORIDE 0.9 % IV SOLN
1.0000 g | Freq: Three times a day (TID) | INTRAVENOUS | Status: DC
  Filled 2022-02-01 (×2): qty 20

## 2022-02-01 MED ORDER — BUDESONIDE 0.5 MG/2ML IN SUSP
0.5000 mg | Freq: Two times a day (BID) | RESPIRATORY_TRACT | Status: DC
  Administered 2022-02-01: 0.5 mg via RESPIRATORY_TRACT
  Filled 2022-02-01 (×2): qty 2

## 2022-02-01 MED ORDER — ADULT MULTIVITAMIN W/MINERALS CH: 1.0000 | ORAL_TABLET | Freq: Every day | ORAL | Status: DC

## 2022-02-01 MED ORDER — POTASSIUM CHLORIDE 10 MEQ/100ML IV SOLN
10.0000 meq | INTRAVENOUS | Status: DC
  Filled 2022-02-01 (×3): qty 100

## 2022-02-01 MED ORDER — FENTANYL 2500MCG IN NS 250ML (10MCG/ML) PREMIX INFUSION: 0.0000 ug/h | INTRAVENOUS | Status: DC

## 2022-02-01 MED ORDER — DOCUSATE SODIUM 50 MG/5ML PO LIQD: 100.0000 mg | Freq: Two times a day (BID) | ORAL | Status: DC

## 2022-02-01 MED ORDER — INSULIN ASPART 100 UNIT/ML IJ SOLN
0.0000 [IU] | INTRAMUSCULAR | Status: DC
  Administered 2022-02-01: 4 [IU] via SUBCUTANEOUS
  Filled 2022-02-01: qty 1

## 2022-02-01 MED ORDER — MIDAZOLAM HCL 2 MG/2ML IJ SOLN
INTRAMUSCULAR | Status: AC
  Administered 2022-02-01: 2 mg
  Filled 2022-02-01: qty 2

## 2022-02-01 MED ORDER — ATORVASTATIN CALCIUM 20 MG PO TABS: 20.0000 mg | ORAL_TABLET | ORAL | Status: DC

## 2022-02-01 MED ORDER — K PHOS MONO-SOD PHOS DI & MONO 155-852-130 MG PO TABS
500.0000 mg | ORAL_TABLET | Freq: Four times a day (QID) | ORAL | Status: DC
  Filled 2022-02-01 (×2): qty 2

## 2022-02-01 MED ORDER — HYDROCORTISONE SOD SUC (PF) 100 MG IJ SOLR
100.0000 mg | Freq: Two times a day (BID) | INTRAMUSCULAR | Status: DC
  Administered 2022-02-01: 100 mg via INTRAVENOUS
  Filled 2022-02-01 (×2): qty 2

## 2022-02-01 MED ORDER — FAMOTIDINE 20 MG PO TABS: 20.0000 mg | ORAL_TABLET | Freq: Two times a day (BID) | ORAL | Status: DC

## 2022-02-01 MED ORDER — ACETAMINOPHEN 325 MG PO TABS: 650.0000 mg | ORAL_TABLET | Freq: Four times a day (QID) | ORAL | Status: DC | PRN

## 2022-02-01 MED ORDER — SODIUM BICARBONATE 8.4 % IV SOLN
100.0000 meq | Freq: Once | INTRAVENOUS | Status: AC
  Administered 2022-02-01: 100 meq via INTRAVENOUS
  Filled 2022-02-01: qty 50

## 2022-02-01 MED ORDER — SODIUM BICARBONATE 8.4 % IV SOLN: 200.0000 meq | Freq: Once | INTRAVENOUS | Status: DC

## 2022-02-01 MED ORDER — METHYLPREDNISOLONE SODIUM SUCC 125 MG IJ SOLR
125.0000 mg | Freq: Once | INTRAMUSCULAR | Status: AC
Start: 2022-02-01 — End: 2022-02-01
  Administered 2022-02-01: 125 mg via INTRAVENOUS
  Filled 2022-02-01: qty 2

## 2022-02-01 MED ORDER — ORAL CARE MOUTH RINSE
15.0000 mL | OROMUCOSAL | Status: DC | PRN
Start: 2022-02-01 — End: 2022-02-02

## 2022-02-01 MED ORDER — ASPIRIN 81 MG PO CHEW: 81.0000 mg | CHEWABLE_TABLET | Freq: Every day | ORAL | Status: DC

## 2022-02-01 MED ORDER — POTASSIUM CHLORIDE 10 MEQ/50ML IV SOLN
10.0000 meq | INTRAVENOUS | Status: AC
  Administered 2022-02-01 (×3): 10 meq via INTRAVENOUS
  Filled 2022-02-01 (×3): qty 50

## 2022-02-01 MED ORDER — ETOMIDATE 2 MG/ML IV SOLN
INTRAVENOUS | Status: AC
  Administered 2022-02-01: 20 mg
  Filled 2022-02-01: qty 20

## 2022-02-01 MED ORDER — POLYVINYL ALCOHOL 1.4 % OP SOLN: 1.0000 [drp] | Freq: Four times a day (QID) | OPHTHALMIC | Status: DC | PRN

## 2022-02-01 MED ORDER — THIAMINE HCL 100 MG PO TABS: 100.0000 mg | ORAL_TABLET | Freq: Every day | ORAL | Status: DC

## 2022-02-01 MED ORDER — ROCURONIUM BROMIDE 10 MG/ML (PF) SYRINGE
PREFILLED_SYRINGE | INTRAVENOUS | Status: AC
  Administered 2022-02-01: 50 mg
  Filled 2022-02-01: qty 10

## 2022-02-01 MED ORDER — ETOMIDATE 2 MG/ML IV SOLN: 20.0000 mg | Freq: Once | INTRAVENOUS | Status: AC

## 2022-02-01 MED ORDER — POTASSIUM CHLORIDE 10 MEQ/100ML IV SOLN
10.0000 meq | INTRAVENOUS | Status: AC
  Administered 2022-02-01 (×6): 10 meq via INTRAVENOUS
  Filled 2022-02-01 (×6): qty 100

## 2022-02-01 MED ORDER — FENTANYL 2500MCG IN NS 250ML (10MCG/ML) PREMIX INFUSION
50.0000 ug/h | INTRAVENOUS | Status: DC
  Administered 2022-02-01: 50 ug/h via INTRAVENOUS
  Filled 2022-02-01: qty 250

## 2022-02-01 MED ORDER — LOPERAMIDE HCL 2 MG PO CAPS
2.0000 mg | ORAL_CAPSULE | ORAL | Status: DC | PRN
Start: 2022-02-01 — End: 2022-02-02
  Filled 2022-02-01: qty 1

## 2022-02-01 MED ORDER — SODIUM CHLORIDE 0.9% FLUSH
10.0000 mL | Freq: Three times a day (TID) | INTRAVENOUS | Status: DC
  Administered 2022-02-01: 10 mL via INTRAPLEURAL

## 2022-02-01 MED ORDER — POTASSIUM CHLORIDE 10 MEQ/100ML IV SOLN
10.0000 meq | INTRAVENOUS | Status: DC
  Filled 2022-02-01 (×4): qty 100

## 2022-02-01 MED ORDER — FENTANYL BOLUS VIA INFUSION: 50.0000 ug | INTRAVENOUS | Status: DC | PRN

## 2022-02-01 MED ORDER — SODIUM CHLORIDE 0.9% IV SOLUTION: Freq: Once | INTRAVENOUS | Status: DC

## 2022-02-01 MED ORDER — BOOST / RESOURCE BREEZE PO LIQD CUSTOM: 1.0000 | Freq: Three times a day (TID) | ORAL | Status: DC

## 2022-02-01 MED ORDER — FENTANYL CITRATE PF 50 MCG/ML IJ SOSY
PREFILLED_SYRINGE | INTRAMUSCULAR | Status: AC
  Administered 2022-02-01: 50 ug
  Filled 2022-02-01: qty 2

## 2022-02-01 MED ORDER — IPRATROPIUM BROMIDE 0.02 % IN SOLN
0.5000 mg | Freq: Four times a day (QID) | RESPIRATORY_TRACT | Status: DC
  Administered 2022-02-01: 0.5 mg via RESPIRATORY_TRACT
  Filled 2022-02-01 (×2): qty 2.5

## 2022-02-01 MED ORDER — POTASSIUM CHLORIDE CRYS ER 20 MEQ PO TBCR
40.0000 meq | EXTENDED_RELEASE_TABLET | ORAL | Status: AC
  Administered 2022-02-01 (×2): 40 meq via ORAL
  Filled 2022-02-01 (×2): qty 2

## 2022-02-01 MED ORDER — FENTANYL CITRATE (PF) 100 MCG/2ML IJ SOLN: 100.0000 ug | Freq: Once | INTRAMUSCULAR | Status: AC

## 2022-02-01 MED ORDER — STERILE WATER FOR INJECTION IV SOLN
INTRAVENOUS | Status: DC
  Filled 2022-02-01 (×3): qty 1000

## 2022-02-01 MED ORDER — ACETAMINOPHEN 650 MG RE SUPP: 650.0000 mg | Freq: Four times a day (QID) | RECTAL | Status: DC | PRN

## 2022-02-02 LAB — TYPE AND SCREEN
ABO/RH(D): A POS
Antibody Screen: NEGATIVE
Unit division: 0
Unit division: 0

## 2022-02-02 LAB — BPAM RBC
Blood Product Expiration Date: 202309062359
Blood Product Expiration Date: 202309062359
ISSUE DATE / TIME: 202308160947
ISSUE DATE / TIME: 202308161557
Unit Type and Rh: 6200
Unit Type and Rh: 6200

## 2022-02-02 LAB — PROTEIN, BODY FLUID (OTHER): Total Protein, Body Fluid Other: 0.7 g/dL

## 2022-02-02 LAB — LEGIONELLA PNEUMOPHILA SEROGP 1 UR AG: L. pneumophila Serogp 1 Ur Ag: NEGATIVE

## 2022-02-03 ENCOUNTER — Other Ambulatory Visit: Payer: Self-pay | Admitting: Nurse Practitioner

## 2022-02-04 LAB — CULTURE, RESPIRATORY W GRAM STAIN
Culture: NO GROWTH
Gram Stain: NONE SEEN

## 2022-02-05 LAB — BODY FLUID CULTURE W GRAM STAIN: Culture: NO GROWTH

## 2022-02-05 LAB — CULTURE, BLOOD (ROUTINE X 2)
Culture: NO GROWTH
Culture: NO GROWTH
Special Requests: ADEQUATE
Special Requests: ADEQUATE

## 2022-02-06 ENCOUNTER — Ambulatory Visit: Payer: Medicare Other | Admitting: Cardiology

## 2022-02-17 NOTE — Progress Notes (Addendum)
Deterioration through day.  Edema+blossoming R PNA with effusion on CXR.  Patient has decided he would like full code status.  Bedside echo EF down ?septic cardiomyopathy  Will intubate/bronch/a-line/chest tube.  Patient agrees to any and all measures to save life.  Called his cousin but went to VM.  Erskine Emery MD PCCM

## 2022-02-17 NOTE — Progress Notes (Signed)
PCCM note  Patient family had a chance to visit this evening.  I discussed over telephone with Ladell Heads, the decision maker designated by the family and confirmed family's request to transition to full comfort measures.  Orders placed for withdrawal of care.  Marshell Garfinkel MD [facility] 02/14/22, 8:22 PM

## 2022-02-17 NOTE — Progress Notes (Signed)
Time of death 2103. Pronounced dead by this nurse and Lew Dawes RN. Doctor, [facility] and family made aware of pt's time of death.

## 2022-02-17 NOTE — Procedures (Signed)
Arterial Catheter Insertion Procedure Note  Kevin Alexander  [id number]  Jul 28, 1965  Date:2022-03-01  Time:2:00 PM    Provider Performing: Bradly Bienenstock    Procedure: Insertion of Arterial Line [phone]) with US guidance ([id number])   Indication(s) Blood pressure monitoring and/or need for frequent ABGs  Consent Unable to obtain consent due to emergent nature of procedure.  Anesthesia None   Time Out Verified patient identification, verified procedure, site/side was marked, verified correct patient position, special equipment/implants available, medications/allergies/relevant history reviewed, required imaging and test results available.   Sterile Technique Maximal sterile technique including full sterile barrier drape, hand hygiene, sterile gown, sterile gloves, mask, hair covering, sterile ultrasound probe cover (if used).   Procedure Description Area of catheter insertion was cleaned with chlorhexidine and draped in sterile fashion. With real-time ultrasound guidance an arterial catheter was placed into the right femoral artery.  Appropriate arterial tracings confirmed on monitor.     Complications/Tolerance None; patient tolerated the procedure well.   EBL Minimal   Specimen(s) None   BIOPATCH applied to the insertion site.   Darel Hong, AGACNP-BC [facility] Prefer epic messenger for cross cover needs If after hours, please call E-link

## 2022-02-17 NOTE — Progress Notes (Signed)
Pt has lost all IV access accept 1 PIV, pt has multiple iv infusions ordered and is requiring 10 mcg Levophed ~ needs central access.  Pt has consented to procedure.   Attempt made to place Left IJ.  Able to successfully cannulate vessel with good blood return (slow trickle of flow, darker red in color).  Guidewire only threads about 5-6 inches before meeting significant resistance.  Changed insertion angle with successful cannulation, however same result with inability to thread guidewire, therefore attempt aborted.  Pressure held at site, no signs of bleeding, hematoma, or change in vital signs.  Obtain CXR to rule out any complications.  Will assess Right IJ and femoral sites once CXR complete.  Pt aware and in agreement.   Darel Hong, AGACNP-BC [facility] Prefer epic messenger for cross cover needs If after hours, please call E-link

## 2022-02-17 NOTE — Progress Notes (Signed)
Extubated at 2056 to comfort measures. Fentanyl restarted as per MD instead of Morphine.

## 2022-02-17 NOTE — Procedures (Signed)
Insertion of Chest Tube Procedure Note  Kevin Alexander  [id number]  [birth year]  Date:2022-02-22  Time:2:03 PM    Provider Performing: Bradly Bienenstock   Procedure: Pleural Catheter Insertion w/ Imaging Guidance [phone])  Indication(s) Effusion  Consent Unable to obtain consent due to emergent nature of procedure.  Anesthesia Topical only with 1% lidocaine    Time Out Verified patient identification, verified procedure, site/side was marked, verified correct patient position, special equipment/implants available, medications/allergies/relevant history reviewed, required imaging and test results available.   Sterile Technique Maximal sterile technique including full sterile barrier drape, hand hygiene, sterile gown, sterile gloves, mask, hair covering, sterile ultrasound probe cover (if used).   Procedure Description Ultrasound used to identify appropriate pleural anatomy for placement and overlying skin marked. Area of placement cleaned and draped in sterile fashion.  A 22 French chest tube was placed into the right pleural space using Seldinger technique. Appropriate return of fluid was obtained.  The tube was connected to atrium and placed on -20 cm H2O wall suction.   Complications/Tolerance None; patient tolerated the procedure well. Chest X-ray is ordered to verify placement.   EBL Minimal  Specimen(s) fluid  BIOPATCH applied to the insertion site.  Pt was performed under the direct supervision of Dr. Tamala Julian.  Kevin Alexander, AGACNP-BC [facility] Prefer epic messenger for cross cover needs If after hours, please call E-link

## 2022-02-17 NOTE — Progress Notes (Signed)
Called to bedside by nursing as pt with increased WOB/increasing FiO2 requirements and anxious.  Pt is about an hour into blood transfusion.  No fevers, RR remains in 20's, HR slightly increased from 90's to low 100's.  Ausculation reveals diffuse expiratory wheezing (no crackles).  Review of CXR from this morning concerning for developing pulmonary edema.  Previously ordered labs resulting: Troponin elevated at 198, BNP 978 and lactic increased to 5.3 from 3.3.  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  Pt ordered for Solumedrol, BD, Pulmicort nebs, and 40 mg IV Lasix.  Fluids discontinued, blood transfusion rate reduced to 75 cc/hr.  Will also give 1 mg morphine x1 dose. Continue to monitor for development of TACO/TRALI or adverse blood reaction.  Pt is awake and alert, requests that his code status be changed to FULL CODE.    O2 sats improved to low to mid 90's on HHFNC. Despite previous interventions, pt unable to tolerate HHFNC (keeps pulling it off, will be unable to tolerate BiPAP without sedating).  Will proceed with intubation.  Pt is in agreement.    Darel Hong, AGACNP-BC [facility] Prefer epic messenger for cross cover needs If after hours, please call E-link

## 2022-02-17 NOTE — Consult Note (Addendum)
[place] for Electrolyte Monitoring and Replacement   Recent Labs: Potassium (mmol/L)  Date Value  02/23/22 3.7  12/25/2013 2.9 (L)   Magnesium (mg/dL)  Date Value  2022/02/23 2.9 (H)   Calcium (mg/dL)  Date Value  February 23, 2022 6.5 (L)   Calcium, Total (mg/dL)  Date Value  12/25/2013 8.8   Albumin (g/dL)  Date Value  01/18/2022 <1.5 (L)  08/04/2021 3.7 (L)   Phosphorus (mg/dL)  Date Value  02-23-2022 1.2 (L)   Sodium (mmol/L)  Date Value  23-Feb-2022 139  08/04/2021 137  12/25/2013 138   Assessment: Patient is a 56 y/o M with medical history including tobacco use disorder, recurrent C diff colitis, prior MRSA infection, prior candidal esophagitis with two month history of N/V/D and abdominal pain with worsening two days prior to presentation. Patient ultimately admitted with shock unresponsive to fluids, elevated lactate, electrolyte abnormalities, colitis and pneumonia.  Severe potassium deficit on presentation with K < 2 accompanied by hypocalcemia (& hypoalbuminemia), hypophosphatemia, and hypomagnesemia  Goal of Therapy:  Within normal limits  Plan:  --Persistent hypokalemia. Suspect patient with large potassium deficit. Has received 170 mEq of potassium so far today. Then, received Lasix 8/16 '@1300'$ . K+ 3.7 '@1500'$ . Will give another KCl 10 mEq x 4.  --Hypomagnesemia resolved --K Phos Neutral 500 mg 4x/day per PCCM --Difficult to interpret calcium level given level of hypoalbuminemia iCal are send out. Replace if symptomatic (CNS disturbance, bradycardia)  Follow up labs in AM. BMP ordered Q8hr.   Addendum: Given minimal urine output and creatinine trending up, will defer Kcl replacement. KCL 10 mEq IV x 4 discontinued (no doses given).  Glean Salvo, PharmD Clinical Pharmacist  2022/02/23 4:01 PM

## 2022-02-17 NOTE — Death Summary Note (Signed)
DEATH SUMMARY   Patient Details  Name: Kevin Alexander MRN: [id number] DOB: 05/26/1966  Admission/Discharge Information   Admit Date:  01/31/2022  Date of Death: Date of Death: 02/12/2022  Time of Death: Time of Death: 2103  Length of Stay: 2  Referring Physician: Holdsworth, Karen, NP     Diagnoses  Profound septic shock secondary to colitis and right upper lobe pneumonia, organism NOS  Hx recurrent C diff  PUD  Iron def anemia  HTN  HLD  ?CAD  MGUS  Spinal stimulator in place for neuropathy  Failure to thrive  Severe protein calorie malnutrition POA   Brief Hospital Course (including significant findings, care, treatment, and services provided and events leading to death)  56 year old male smoker with hx of recurrent C diff colitis, prior MRSA infection, prior candidal esophagitis presenting with 2+ months of N/V/D, abdominal pain worse in past 2 days.  Denies chest pain.  Has a chronic smoker's cough.  Workup reveals shock unresponsive to fluids, elevated lactate, myriad of electrolyte abnormalities, colitis and PNA on CT.  PCCM asked to admit.  ROS as below.   Per family he has been sick for quite a while, losing weight, possible aspiration symptoms, suffering with poor QOL.  After he deteriorated on arrival to require intubation and escalating pressors, family decided to transition to comfort care.  Pertinent Labs and Studies  Significant Diagnostic Studies DG Abd 1 View  Result Date: 01/27/2022 CLINICAL DATA:  Chest tube in place.  OG tube placement EXAM: ABDOMEN - 1 VIEW COMPARISON:  07/13/2015 FINDINGS: NG tube with tip in the distal stomach. Side port below the GE junction. Normal bowel-gas pattern. IMPRESSION: NG tube in distal stomach. Electronically Signed   By: Stewart  Edmunds M.D.   On: 02/05/2022 14:54   DG Chest 1 View  Result Date: 02/04/2022 CLINICAL DATA:  Intubation, chest tube in place EXAM: CHEST  1 VIEW COMPARISON:  01/20/2022 FINDINGS:  Interval placement of endotracheal tube, tip just below the thoracic inlet, esophagogastric tube tip and side port below the diaphragm, and right-sided straight bore chest tube tip about the paramedian right upper lobe. Right neck vascular catheter remains in position but has been slightly retracted to the mid SVC. Small, approximately 10% right apical pneumothorax. Otherwise improved aeration of the lungs, with persistent, diffuse bilateral interstitial pulmonary opacity throughout. IMPRESSION: 1. Interval placement of endotracheal tube, tip just below the thoracic inlet, esophagogastric tube tip and side port below the diaphragm, and right-sided straight bore chest tube tip about the paramedian right upper lobe. 2. Small, approximately 10% right apical pneumothorax. 3. Otherwise improved aeration of the lungs, with persistent, diffuse bilateral interstitial pulmonary opacity throughout. Electronically Signed   By: Alex D Bibbey M.D.   On: 01/21/2022 14:33   DG Chest Port 1 View  Result Date: 01/23/2022 CLINICAL DATA:  Central line placement EXAM: PORTABLE CHEST 1 VIEW COMPARISON:  Chest x-ray dated February 01, 2022 FINDINGS: Interval placement of right IJ line with tip positioned over the expected area of the mid SVC. Cardiac and mediastinal contours are unchanged. Slightly increased diffuse bilateral interstitial opacities. Increased consolidation of the mid right lung. Small right pleural effusion. No evidence of pneumothorax. IMPRESSION: 1. Interval placement of right IJ line with tip positioned over the expected area of the mid SVC. 2. Slightly increased diffuse bilateral interstitial opacities, likely due to pulmonary edema. 3. New small right pleural effusion. 4. Increased consolidation of the mid right lung, concerning for infection. Electronically Signed     By: Leah  Strickland M.D.   On: 01/18/2022 09:34   DG Chest Port 1 View  Result Date: 02/07/2022 CLINICAL DATA:  Failed attempt at central line  placement. EXAM: PORTABLE CHEST 1 VIEW COMPARISON:  01/22/2022 FINDINGS: Heart size appears normal. No pleural effusion identified. Interval increase in diffuse interstitial markings compatible with pulmonary edema. Asymmetric opacities within the right middle lobe and right upper lobe are new compared with the previous exam. No pneumothorax visualized. IMPRESSION: 1. No pneumothorax after attempted central line placement. 2. New asymmetric opacities within the right middle lobe and right upper lobe may reflect areas of asymmetric edema or infection. 3. Mild diffuse increase interstitial markings compatible with interstitial edema. New from previous exam. Electronically Signed   By: Taylor  Stroud M.D.   On: 01/23/2022 08:35   CT ABDOMEN PELVIS W CONTRAST  Result Date: 02/12/2022 CLINICAL DATA:  Hypotension, diarrhea and vomiting x1 month. EXAM: CT ABDOMEN AND PELVIS WITH CONTRAST TECHNIQUE: Multidetector CT imaging of the abdomen and pelvis was performed using the standard protocol following bolus administration of intravenous contrast. RADIATION DOSE REDUCTION: This exam was performed according to the departmental dose-optimization program which includes automated exposure control, adjustment of the mA and/or kV according to patient size and/or use of iterative reconstruction technique. CONTRAST:  100mL OMNIPAQUE IOHEXOL 300 MG/ML  SOLN COMPARISON:  CT November 30, 2021 FINDINGS: Lower chest: Motion degraded examination of the lungs. Streaky ground-glass in the right middle lobe. Hepatobiliary: Hepatic steatosis with similar nodular hypodense area along the falciform ligament measuring 17 mm on image 21/2 likely reflecting focal fatty deposition. Gallbladder is unremarkable. No biliary ductal dilation. Pancreas: No pancreatic ductal dilation or evidence of acute inflammation. Spleen: No splenomegaly or focal splenic lesion. Adrenals/Urinary Tract: Bilateral adrenal glands appear normal. No hydronephrosis. Fluid  density right renal cyst is considered benign and requires no independent imaging follow-up. No suspicious renal mass. Urinary bladder is unremarkable for degree of distension within the limitation of streak artifact from left hip arthroplasty. Stomach/Bowel: No radiopaque enteric contrast material was administered. Small hiatal hernia. Mild gastric wall thickening versus underdistention. No pathologic dilation of small or large bowel. The appendix and terminal ileum appear normal. Fatty infiltration of the wall of the ascending and proximal transverse colon suggestive of chronic inflammation. Vascular/Lymphatic: Aortic atherosclerosis. No pathologically enlarged abdominal or pelvic lymph nodes. Reproductive: Prostate is unremarkable within limitation of streak artifact from left hip arthroplasty. Other: No significant abdominopelvic free fluid. Spinal stimulator generator in the left posterior gluteal subcutaneous soft tissues with lead extending into the canal terminating at the T12 vertebral body level. Inguinal hernia repair mesh. Musculoskeletal: Multilevel degenerative changes spine. Left total hip arthroplasty. IMPRESSION: 1. Gastric wall thickening versus underdistention. Recommend correlation for gastritis. 2. Fatty infiltration of the wall of the ascending and proximal colon suggests chronic inflammation. 3. Streaky ground-glass opacity in the right middle lobe is favored infectious or inflammatory. 4. Hepatic steatosis with focal nodular fatty infiltration along the falciform ligament, similar prior. 5.  Aortic Atherosclerosis (ICD10-I70.0). Electronically Signed   By: Jeffrey  Waltz M.D.   On: 01/25/2022 15:17   DG Chest Port 1 View  Result Date: 02/11/2022 CLINICAL DATA:  Hypotension, diarrhea and vomiting for 1 month, questionable sepsis EXAM: PORTABLE CHEST 1 VIEW COMPARISON:  Portable exam 1054 hours compared to 11/02/2021 FINDINGS: Normal heart size, mediastinal contours, and pulmonary  vascularity. Lungs clear. No pleural effusion or pneumothorax. Bones demineralized. Intraspinal stimulator noted. IMPRESSION: No acute abnormalities. Electronically Signed   By: Mark    Boles M.D.   On: 02/10/2022 11:21    Microbiology Recent Results (from the past 240 hour(s))  Blood Culture (routine x 2)     Status: None   Collection Time: 02/04/2022 10:24 AM   Specimen: BLOOD  Result Value Ref Range Status   Specimen Description BLOOD LEFT AC  Final   Special Requests   Final    BOTTLES DRAWN AEROBIC AND ANAEROBIC Blood Culture adequate volume   Culture   Final    NO GROWTH 5 DAYS Performed at [facility], [address] [place], [place] [address]    Report Status 02/05/2022 FINAL  Final  Gastrointestinal Panel by PCR , Stool     Status: None   Collection Time: 01/17/2022 10:24 AM   Specimen: STOOL  Result Value Ref Range Status   Campylobacter species NOT DETECTED NOT DETECTED Final   Plesimonas shigelloides NOT DETECTED NOT DETECTED Final   Salmonella species NOT DETECTED NOT DETECTED Final   Yersinia enterocolitica NOT DETECTED NOT DETECTED Final   Vibrio species NOT DETECTED NOT DETECTED Final   Vibrio cholerae NOT DETECTED NOT DETECTED Final   Enteroaggregative E coli (EAEC) NOT DETECTED NOT DETECTED Final   Enteropathogenic E coli (EPEC) NOT DETECTED NOT DETECTED Final   Enterotoxigenic E coli (ETEC) NOT DETECTED NOT DETECTED Final   Shiga like toxin producing E coli (STEC) NOT DETECTED NOT DETECTED Final   Shigella/Enteroinvasive E coli (EIEC) NOT DETECTED NOT DETECTED Final   Cryptosporidium NOT DETECTED NOT DETECTED Final   Cyclospora cayetanensis NOT DETECTED NOT DETECTED Final   Entamoeba histolytica NOT DETECTED NOT DETECTED Final   Giardia lamblia NOT DETECTED NOT DETECTED Final   Adenovirus F40/41 NOT DETECTED NOT DETECTED Final   Astrovirus NOT DETECTED NOT DETECTED Final   Norovirus GI/GII NOT DETECTED NOT DETECTED Final   Rotavirus A NOT DETECTED NOT  DETECTED Final   Sapovirus (I, II, IV, and V) NOT DETECTED NOT DETECTED Final    Comment: Performed at [facility], [address] [place], [place] [address]  C Difficile Quick Screen w PCR reflex     Status: None   Collection Time: 01/28/2022 10:24 AM   Specimen: STOOL  Result Value Ref Range Status   C Diff antigen NEGATIVE NEGATIVE Final   C Diff toxin NEGATIVE NEGATIVE Final   C Diff interpretation No C. difficile detected.  Final    Comment: Performed at [facility], [address] [place], [place] [address]  Resp Panel by RT-PCR (Flu A&B, Covid) Anterior Nasal Swab     Status: None   Collection Time: 01/26/2022 11:28 AM   Specimen: Anterior Nasal Swab  Result Value Ref Range Status   SARS Coronavirus 2 by RT PCR NEGATIVE NEGATIVE Final    Comment: (NOTE) SARS-CoV-2 target nucleic acids are NOT DETECTED.  The SARS-CoV-2 RNA is generally detectable in upper respiratory specimens during the acute phase of infection. The lowest concentration of SARS-CoV-2 viral copies this assay can detect is 138 copies/mL. A negative result does not preclude SARS-Cov-2 infection and should not be used as the sole basis for treatment or other patient management decisions. A negative result may occur with  improper specimen collection/handling, submission of specimen other than nasopharyngeal swab, presence of viral mutation(s) within the areas targeted by this assay, and inadequate number of viral copies(<138 copies/mL). A negative result must be combined with clinical observations, patient history, and epidemiological information. The expected result is Negative.  Fact Sheet for Patients:  https://www.fda.gov/media/152166/download    Fact Sheet for Healthcare Providers:  https://www.fda.gov/media/152162/download  This test is no t yet approved or cleared by the [place] FDA and  has been authorized for detection and/or diagnosis of SARS-CoV-2 by FDA under an Emergency  Use Authorization (EUA). This EUA will remain  in effect (meaning this test can be used) for the duration of the COVID-19 declaration under Section 564(b)(1) of the Act, 21 U.S.C.section 360bbb-3(b)(1), unless the authorization is terminated  or revoked sooner.       Influenza A by PCR NEGATIVE NEGATIVE Final   Influenza B by PCR NEGATIVE NEGATIVE Final    Comment: (NOTE) The Xpert Xpress SARS-CoV-2/FLU/RSV plus assay is intended as an aid in the diagnosis of influenza from Nasopharyngeal swab specimens and should not be used as a sole basis for treatment. Nasal washings and aspirates are unacceptable for Xpert Xpress SARS-CoV-2/FLU/RSV testing.  Fact Sheet for Patients: https://www.fda.gov/media/152166/download  Fact Sheet for Healthcare Providers: https://www.fda.gov/media/152162/download  This test is not yet approved or cleared by the [place] FDA and has been authorized for detection and/or diagnosis of SARS-CoV-2 by FDA under an Emergency Use Authorization (EUA). This EUA will remain in effect (meaning this test can be used) for the duration of the COVID-19 declaration under Section 564(b)(1) of the Act, 21 U.S.C. section 360bbb-3(b)(1), unless the authorization is terminated or revoked.  Performed at [facility], [address] [place], [place] [address]   Urine Culture     Status: Abnormal   Collection Time: 02/09/2022  2:54 PM   Specimen: Urine, Catheterized  Result Value Ref Range Status   Specimen Description   Final    URINE, CATHETERIZED Performed at [facility], [address] [place], [place] [address]    Special Requests   Final    URINE, CATHETERIZED Performed at [facility], [address] [place], [place] [address]    Culture (A)  Final    <10,000 COLONIES/mL INSIGNIFICANT GROWTH Performed at [facility], [address] [place], [place] [address]    Report Status 02/02/2022 FINAL  Final  MRSA Next Gen  by PCR, Nasal     Status: None   Collection Time: 01/29/2022  5:12 PM   Specimen: Nasal Mucosa; Nasal Swab  Result Value Ref Range Status   MRSA by PCR Next Gen NOT DETECTED NOT DETECTED Final    Comment: (NOTE) The GeneXpert MRSA Assay (FDA approved for NASAL specimens only), is one component of a comprehensive MRSA colonization surveillance program. It is not intended to diagnose MRSA infection nor to guide or monitor treatment for MRSA infections. Test performance is not FDA approved in patients less than 2 years old. Performed at [facility], [address] [place],  [address]   Blood Culture (routine x 2)     Status: None   Collection Time: 01/20/2022  6:01 PM   Specimen: BLOOD  Result Value Ref Range Status   Specimen Description BLOOD BLOOD LEFT HAND  Final   Special Requests   Final    BOTTLES DRAWN AEROBIC AND ANAEROBIC Blood Culture adequate volume   Culture   Final    NO GROWTH 5 DAYS Performed at [facility], [address] [place],  [address]    Report Status 02/05/2022 FINAL  Final  Culture, Respiratory w Gram Stain     Status: None   Collection Time: 01/28/2022 12:54 PM   Specimen: Bronchoalveolar Lavage; Respiratory  Result Value Ref Range Status   Specimen Description     Final    BRONCHIAL ALVEOLAR LAVAGE Performed at [facility], [address] [place], [place] [address]    Special Requests   Final    NONE Performed at [facility], [address] [place], [place] [address]    Gram Stain NO WBC SEEN NO ORGANISMS SEEN   Final   Culture   Final    NO GROWTH Performed at [facility], [address] [place], [place] [address]    Report Status 02/04/2022 FINAL  Final  Body fluid culture w Gram Stain     Status: None   Collection Time: 02/03/2022  1:55 PM   Specimen: Pleura; Body Fluid  Result Value Ref Range Status   Specimen Description   Final    PLEURAL Performed at [facility],  [address] [place], [place] [address]    Special Requests   Final    NONE Performed at [facility], [address] [place], [place] [address]    Gram Stain   Final    RARE WBC PRESENT, PREDOMINANTLY PMN NO ORGANISMS SEEN    Culture   Final    NO GROWTH 3 DAYS Performed at [facility], [address] [place], [place] [address]    Report Status 02/05/2022 FINAL  Final    Lab Basic Metabolic Panel: Recent Labs  Lab 02/09/2022 1024 01/28/2022 1801 02/06/2022 0005 01/19/2022 0400 01/26/2022 0703 02/14/2022 1509  NA 131* 133* 130*  --  134* 139  K <2.0* <2.0* <2.0*  --  2.1* 3.7  CL 103 107 104  --  109 110  CO2 20* 18* 17*  --  16* 9*  GLUCOSE 91 122* 164*  --  172* 305*  BUN 8 9 7  --  6 6  CREATININE 1.08 1.08 1.01  --  1.06 1.22  CALCIUM 5.8* 5.7* 6.6*  --  7.0* 6.5*  MG 1.4*  --  3.2* 2.9*  --   --   PHOS 1.6*  --  1.3* 1.2*  --   --    Liver Function Tests: Recent Labs  Lab 02/13/2022 1024  AST 18  ALT 6  ALKPHOS 105  BILITOT 0.6  PROT 3.3*  ALBUMIN <1.5*   Recent Labs  Lab 02/09/2022 1024  LIPASE 25   No results for input(s): "AMMONIA" in the last 168 hours. CBC: Recent Labs  Lab 02/12/2022 1024 02/06/2022 0400  WBC 16.5* 15.4*  NEUTROABS 13.6*  --   HGB 8.9* 6.7*  HCT 25.3* 19.1*  MCV 86.3 86.8  PLT 222 195   Cardiac Enzymes: No results for input(s): "CKTOTAL", "CKMB", "CKMBINDEX", "TROPONINI" in the last 168 hours. Sepsis Labs: Recent Labs  Lab 02/10/2022 1024 01/26/2022 1310 01/30/2022 0400 02/14/2022 1013 01/30/2022 1509 01/30/2022 1702  PROCALCITON 0.58  --   --  0.55  --   --   WBC 16.5*  --  15.4*  --   --   --   LATICACIDVEN 4.5* 3.3*  --  5.3* >9.0* >9.0*       C  02/06/2022, 4:45 PM   

## 2022-02-17 NOTE — Progress Notes (Signed)
[facility] Progress Note Patient Name: KHALIB FENDLEY DOB: September 24, 1965 MRN: [id number]   Date of Service  02-24-2022  HPI/Events of Note  Hemoglobin 6.7 gm / dl, no evidence of overt hemorrhage.  eICU Interventions  Transfusion of one unit of PRBC ordered.        Frederik Pear Feb 24, 2022, 6:36 AM

## 2022-02-17 NOTE — Progress Notes (Signed)
[facility] Progress Note Patient Name: Kevin Alexander DOB: [birth year] MRN: [id number]   Date of Service  02-16-22  HPI/Events of Note  Phosphorus 1.2.  eICU Interventions  Neutraphos 500 mg po Q 6 hours x 3 days ordered.        [name] Tangie Stay 02/16/22, 6:09 AM

## 2022-02-17 NOTE — Procedures (Signed)
Bronchoscopy Procedure Note  Kevin Alexander  [id number]  [birth year]  Date:2022/02/21  Time:12:46 PM   Provider Performing:China Deitrick C Tamala Julian   Procedure(s):  Flexible bronchoscopy with bronchial alveolar lavage [phone])  Indication(s) RLL PNA  Consent Unable to obtain consent due to emergent nature of procedure.  Anesthesia In place for intubation   Time Out Verified patient identification, verified procedure, site/side was marked, verified correct patient position, special equipment/implants available, medications/allergies/relevant history reviewed, required imaging and test results available.   Sterile Technique Usual hand hygiene, masks, gowns, and gloves were used   Procedure Description Bronchoscope advanced through endotracheal tube and into airway.  Airways were examined down to subsegmental level with findings noted below.   Following diagnostic evaluation, BAL(s) performed in RLL with normal saline and return of clear fluid  Findings:  - ETT in good position - Minimal secretions  Complications/Tolerance None; patient tolerated the procedure well. Chest X-ray is not needed post procedure.   EBL Minimal   Specimen(s) RLL BAL

## 2022-02-17 NOTE — Consult Note (Signed)
[facility] for Electrolyte Monitoring and Replacement   Recent Labs: Potassium (mmol/L)  Date Value  02-Feb-2022 <2.0 (LL)  12/25/2013 2.9 (L)   Magnesium (mg/dL)  Date Value  02/15/2022 2.9 (H)   Calcium (mg/dL)  Date Value  01/21/2022 6.6 (L)   Calcium, Total (mg/dL)  Date Value  12/25/2013 8.8   Albumin (g/dL)  Date Value  02/05/2022 <1.5 (L)  08/04/2021 3.7 (L)   Phosphorus (mg/dL)  Date Value  01/29/2022 1.2 (L)   Sodium (mmol/L)  Date Value  01/25/2022 130 (L)  08/04/2021 137  12/25/2013 138   Assessment: Patient is a 56 y/o M with medical history including tobacco use disorder, recurrent C diff colitis, prior MRSA infection, prior candidal esophagitis with two month history of N/V/D and abdominal pain with worsening two days prior to presentation. Patient ultimately admitted with shock unresponsive to fluids, elevated lactate, electrolyte abnormalities, colitis and pneumonia.  Severe potassium deficit on presentation with K < 2 accompanied by hypocalcemia (& hypoalbuminemia), hypophosphatemia, and hypomagnesemia  Goal of Therapy:  Within normal limits  Plan:  --Persistent hypokalemia. Suspect patient with large potassium deficit. Patient has received 170 mEq of potassium so far today (combined oral and IV) --Hypomagnesemia resolved --K Phos Neutral 500 mg 4x/day per PCCM --Difficult to interpret calcium level given level of hypoalbuminemia iCal are send out. Replace if symptomatic (CNS disturbance, bradycardia) --BMP q8h, Mg and Phos tomorrow AM  Benita Gutter 01/24/2022 8:03 AM

## 2022-02-17 NOTE — Progress Notes (Signed)
Spoke individually and at length with patients cousin, mother and son.  Prior to being intubated the patient designated his cousin Marcie Bal to be his Media planner.  After being made aware of patients rapidly deteriorating status Marcie Bal elected to make the patient a DNR but asked that his mother and son be updated before making further decisions.  After detailed conversation with all three family members, all party's agree patient would not want to remain on life support.  A second nurse confirmed familys decision and all information relayed to physician.  Patient has been transition to "do not escalate" until family can come to bedside.  At that time family feels comfortable withdrawing care.

## 2022-02-17 NOTE — Progress Notes (Addendum)
[facility] Progress Note Patient Name: Kevin Alexander DOB: May 24, 1966 MRN: [id number]   Date of Service  2022-02-23  HPI/Events of Note  K+ < 2.0 after an 80 meq iv bolus of KCL.  eICU Interventions  KCL 10 meq iv Q 1 hour x 6. KCL 40 meq po Q 4 hours x 2 doses.        [name] [name] 02-23-2022, 12:45 AM

## 2022-02-17 NOTE — Procedures (Signed)
Intubation Procedure Note  Kevin Alexander  [id number]  1965-06-23  Date:02-28-2022  Time:12:33 PM   Provider Performing:Yacqub Baston D Dewaine Conger    Procedure: Intubation ([address])  Indication(s) Respiratory Failure  Consent Risks of the procedure as well as the alternatives and risks of each were explained to the patient and/or caregiver.  Consent for the procedure was obtained and is signed in the bedside chart   Anesthesia Etomidate and Rocuronium   Time Out Verified patient identification, verified procedure, site/side was marked, verified correct patient position, special equipment/implants available, medications/allergies/relevant history reviewed, required imaging and test results available.   Sterile Technique Usual hand hygeine, masks, and gloves were used   Procedure Description Patient positioned in bed supine.  Sedation given as noted above.  Patient was intubated with endotracheal tube using Glidescope.  View was Grade 1 full glottis .  Number of attempts was 1.  Colorimetric CO2 detector was consistent with tracheal placement.   Complications/Tolerance None; patient tolerated the procedure well. Chest X-ray is ordered to verify placement.   EBL Minimal   Specimen(s) None  Size 8.0 ETT Tube secured at 23 cm at lip.   Darel Hong, AGACNP-BC [facility] Prefer epic messenger for cross cover needs If after hours, please call E-link

## 2022-02-17 NOTE — Progress Notes (Signed)
Patient extubated to Comfort measures only per MD order.
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
## 2022-02-17 NOTE — Consult Note (Signed)
Pharmacy Antibiotic Note  Kevin Alexander is a 56 y.o. male w/ h/o tobacco use disorder, recurrent C diff colitis, prior MRSA infection, prior candidal esophagitis with two month history of N/V/D and abdominal pain with worsening two days prior to presentation. Patient ultimately admitted with shock unresponsive to fluids, elevated lactate, electrolyte abnormalities, colitis and pneumonia. Pharmacy has been consulted for Merrem dosing.  Plan: Rocephin IV 2g q24h >>> Merrem 1g IV q8h  Pt also receiving Azithromycin 588m IV q24h & Metronidazole 5082mIV q8h.  Weight: 59.7 kg (131 lb 9.8 oz)  Temp (24hrs), Avg:95.7 F (35.4 C), Min:93 F (33.9 C), Max:98.2 F (36.8 C)  Recent Labs  Lab 02/04/2022 1024 02/13/2022 1310 01/23/2022 1801 [id number] 08Aug 31, 2023013 [id number]  WBC 16.5*  --   --   --  15.4*  --   --   --   --   CREATININE 1.08  --  1.08 1.01  --  1.06  --  1.22  --   LATICACIDVEN 4.5* 3.3*  --   --   --   --  5.3* >9.0* >9.0*    Estimated Creatinine Clearance: 57.1 mL/min (by C-G formula based on SCr of 1.22 mg/dL).    Allergies  Allergen Reactions   Fluconazole Rash   Gabapentin Other (See Comments)    Passing out    Pantoprazole Nausea And Vomiting   Amlodipine Itching and Rash    Antimicrobials this admission: S/p VAN/CRO/CFP/Azith x1 in ED (8/15) + Merrem/MTZ/Azith (8/16 >>   Dose adjustments this admission: CTM and adjust PRN  Microbiology results: 8/16 Rt Pleural Fl: pending  8/16 Resp BAL: pending   8/15 BCx: NGTD 8/15 UCx: <10k CFU insignificant growth  8/15 GIP: Negative  8/15 Cdiff Ag/Tox: neg/neg  8/15 MRSA/Covid/Flu: negative  Thank you for allowing pharmacy to be a part of this patient's care.  BrLorna Dibble/02-16-22:24 PM

## 2022-02-17 NOTE — Progress Notes (Addendum)
Initial Nutrition Assessment  DOCUMENTATION CODES:   Severe malnutrition in context of chronic illness  INTERVENTION:   Boost Breeze po TID, each supplement provides 250 kcal and 9 grams of protein  MVI po daily   Pt at high refeed risk; recommend monitor potassium, magnesium and phosphorus labs daily until stable  Check vitamins B1, B6, B3, A, E, C, zinc and copper.   NUTRITION DIAGNOSIS:   Severe Malnutrition related to chronic illness (COPD, (chronic nausea, vomiting and diarhea)) as evidenced by 11 percent weight loss in 6 months, severe fat depletion, severe muscle depletion.  GOAL:   Patient will meet greater than or equal to 90% of their needs  MONITOR:   PO intake, Supplement acceptance, Labs, Weight trends, Skin, I & O's  REASON FOR ASSESSMENT:   Consult Assessment of nutrition requirement/status  ASSESSMENT:   56 y/o male with h/o C-diff, esophageal candiasis, chronic pain, MI, esophageal stenosis s/p dilation, MGUS, MRSA infection, COPD, hiatal hernia, CAD, GERD, HTN, IDA, RLS, DJD, HLD, PUD, hernia repair (bilateral inguinal? 2002), depression, marijuana use and chronic nausea/vomiting/diarrhea and who is now admitted with PNA, septic shock and FTT.  Met with pt in room today. Pt is well known to this RD from previous admissions. Pt with long history or recurrent nausea, vomiting and diarrhea. Pt does have a history of esophageal stenosis with dilations along with candidiasis. Pt has had progressive poor oral intake and wt loss over the past several years. Pt reports today that he has not eaten much of anything for the past month. Per chart, pt is down 16lbs(11%) over the past 6 months; this is significant weight loss. RD sent out vitamins labs in 2020 to r/o deficiency as the cause of his chronic diarrhea. Pt had numerous vitamin labs return low; pt reports today that he has not been taking any vitamins pta. Pt does have a h/o daily marijuana use. Pt with nausea and  vomiting today and is eating 0% of meals. Pt is unable to drink "milky" supplements as they worsen his diarrhea. Pt is willing to drink Boost Breeze. Of note, pt wears dentures. RD will add supplements and vitamins to help pt meet his estimated needs. RD will recheck vitamin labs to r/o ongoing deficiency. Pt is at high refeed risk. Pt may require post pyloric NGT and nutrition support if he is unable to start taking in oral intake soon.    Medications reviewed and include: aspirin, pepcid, solu-cortef, nicotine, omeprazole, Kphos, thiamine, azithromycin, metronidazole, levophed, KCl, vasopressin  Labs reviewed: Na 134(L), K 2.1(L), P 1.2(L), Mg 2.9(H) Iron 28(L), TIBC 196(L), ferritin 776(H), folate 11.0, B12 531- 2/16 Vitamin D 37.6- 2/16 Wbc- 15.4(H), Hgb 6.7(L), Hct 19.1(L)  NUTRITION - FOCUSED PHYSICAL EXAM:  Flowsheet Row Most Recent Value  Orbital Region Moderate depletion  Upper Arm Region Severe depletion  Thoracic and Lumbar Region Moderate depletion  Buccal Region Moderate depletion  Temple Region Severe depletion  Clavicle Bone Region Severe depletion  Clavicle and Acromion Bone Region Severe depletion  Scapular Bone Region Moderate depletion  Dorsal Hand Moderate depletion  Patellar Region Severe depletion  Anterior Thigh Region Severe depletion  Posterior Calf Region Severe depletion  Edema (RD Assessment) None  Hair Reviewed  Eyes Reviewed  Mouth Reviewed  Skin Reviewed  Nails Reviewed   Diet Order:   Diet Order             Diet NPO time specified  Diet effective now  EDUCATION NEEDS:   Education needs have been addressed  Skin:  Skin Assessment: Reviewed RN Assessment (Stage II sacrum)  Last BM:  8/16- type 6  Height:   Ht Readings from Last 1 Encounters:  11/30/21 _0  (1.702 m)    Weight:   Wt Readings from Last 1 Encounters:  Feb 12, 2022 59.7 kg    Ideal Body Weight:  67.2 kg  BMI:  Body mass index is 20.61  kg/m.  Estimated Nutritional Needs:   Kcal:  1800-2100kcal/day  Protein:  90-105g/day  Fluid:  1.8-2.1L/day  Koleen Distance MS, RD, LDN Please refer to [facility] for RD and/or RD on-call/weekend/after hours pager

## 2022-02-17 NOTE — Progress Notes (Signed)
Patient's family spent time with pt at bedside and ultimately decided to proceed to withdrawal of care. Called [facility] and spoke with [facility] with referral number [phone]. Not a candidate for organ donation but possible tissue and eye.

## 2022-02-17 NOTE — Procedures (Signed)
Central Venous Catheter Insertion Procedure Note  JSHON IBE  [id number]  [birth year]  Date:02-15-2022  Time:9:18 AM   Provider Performing:Kaeden Mester D Dewaine Conger   Procedure: Insertion of Non-tunneled Central Venous [phone]) with US guidance ([id number])   Indication(s) Medication administration and Difficult access  Consent Risks of the procedure as well as the alternatives and risks of each were explained to the patient and/or caregiver.  Consent for the procedure was obtained and is signed in the bedside chart  Anesthesia Topical only with 1% lidocaine   Timeout Verified patient identification, verified procedure, site/side was marked, verified correct patient position, special equipment/implants available, medications/allergies/relevant history reviewed, required imaging and test results available.  Sterile Technique Maximal sterile technique including full sterile barrier drape, hand hygiene, sterile gown, sterile gloves, mask, hair covering, sterile ultrasound probe cover (if used).  Procedure Description Area of catheter insertion was cleaned with chlorhexidine and draped in sterile fashion.  With real-time ultrasound guidance a central venous catheter was placed into the right internal jugular vein. Nonpulsatile blood flow and easy flushing noted in all ports.  The catheter was sutured in place and sterile dressing applied.  Complications/Tolerance None; patient tolerated the procedure well. Chest X-ray is ordered to verify placement for internal jugular or subclavian cannulation.   Chest x-ray is not ordered for femoral cannulation.  EBL Minimal  Specimen(s) None    Line secured at the 16 cm mark. BIOPATCH applied to the insertion site.   Darel Hong, AGACNP-BC [facility] Prefer epic messenger for cross cover needs If after hours, please call E-link
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
## 2022-02-17 NOTE — Progress Notes (Addendum)
Continued deterioration.  Coox okay.  Crisoforo Oxford updated.  Epi added to levo/vaso.  She deferred code status discussions to [phone] Janetta Hora (mother).  Called and left VM.  Not sure he will live night.

## 2022-02-17 DEATH — deceased

## 2022-03-01 LAB — BLOOD GAS, ARTERIAL
Acid-base deficit: 20.9 mmol/L — ABNORMAL HIGH (ref 0.0–2.0)
Bicarbonate: 7.1 mmol/L — ABNORMAL LOW (ref 20.0–28.0)
FIO2: 100 %
O2 Saturation: 100 %
PEEP: 12 cmH2O
Patient temperature: 37
pCO2 arterial: 23 mmHg — ABNORMAL LOW (ref 32–48)
pH, Arterial: 7.1 — CL (ref 7.35–7.45)
pO2, Arterial: 451 mmHg — ABNORMAL HIGH (ref 83–108)
